# Patient Record
Sex: Male | Born: 1945 | Race: White | Hispanic: No | Marital: Married | Smoking: Former smoker
Health system: Southern US, Community
[De-identification: ages and names within clinical notes are randomized; demographics above are authoritative.]

## PROBLEM LIST (undated history)

## (undated) ENCOUNTER — Ambulatory Visit (HOSPITAL_BASED_OUTPATIENT_CLINIC_OR_DEPARTMENT_OTHER): Payer: Self-pay | Admitting: Gastroenterology

## (undated) DIAGNOSIS — E785 Hyperlipidemia, unspecified: Secondary | ICD-10-CM

## (undated) DIAGNOSIS — K219 Gastro-esophageal reflux disease without esophagitis: Secondary | ICD-10-CM

## (undated) DIAGNOSIS — E119 Type 2 diabetes mellitus without complications: Secondary | ICD-10-CM

## (undated) DIAGNOSIS — C61 Malignant neoplasm of prostate: Secondary | ICD-10-CM

## (undated) DIAGNOSIS — I10 Essential (primary) hypertension: Secondary | ICD-10-CM

## (undated) DIAGNOSIS — IMO0001 Reserved for inherently not codable concepts without codable children: Secondary | ICD-10-CM

## (undated) DIAGNOSIS — F32A Depression, unspecified: Secondary | ICD-10-CM

## (undated) DIAGNOSIS — H9190 Unspecified hearing loss, unspecified ear: Secondary | ICD-10-CM

## (undated) DIAGNOSIS — F329 Major depressive disorder, single episode, unspecified: Secondary | ICD-10-CM

## (undated) DIAGNOSIS — C189 Malignant neoplasm of colon, unspecified: Secondary | ICD-10-CM

## (undated) DIAGNOSIS — F319 Bipolar disorder, unspecified: Secondary | ICD-10-CM

## (undated) DIAGNOSIS — C20 Malignant neoplasm of rectum: Secondary | ICD-10-CM

## (undated) DIAGNOSIS — I712 Thoracic aortic aneurysm, without rupture: Secondary | ICD-10-CM

## (undated) DIAGNOSIS — F419 Anxiety disorder, unspecified: Secondary | ICD-10-CM

## (undated) DIAGNOSIS — K8050 Calculus of bile duct without cholangitis or cholecystitis without obstruction: Secondary | ICD-10-CM

## (undated) DIAGNOSIS — I7121 Aneurysm of the ascending aorta, without rupture: Secondary | ICD-10-CM

## (undated) DIAGNOSIS — M1990 Unspecified osteoarthritis, unspecified site: Secondary | ICD-10-CM

## (undated) DIAGNOSIS — F0150 Vascular dementia without behavioral disturbance: Secondary | ICD-10-CM

## (undated) DIAGNOSIS — G709 Myoneural disorder, unspecified: Secondary | ICD-10-CM

## (undated) HISTORY — DX: Malignant neoplasm of colon, unspecified: C18.9

## (undated) HISTORY — PX: COLONOSCOPY: SHX174

## (undated) HISTORY — DX: Hyperlipidemia, unspecified: E78.5

## (undated) HISTORY — DX: Depression, unspecified: F32.A

## (undated) HISTORY — DX: Unspecified osteoarthritis, unspecified site: M19.90

## (undated) HISTORY — DX: Anxiety disorder, unspecified: F41.9

## (undated) HISTORY — DX: Malignant neoplasm of rectum: C20

## (undated) HISTORY — DX: Major depressive disorder, single episode, unspecified: F32.9

## (undated) HISTORY — DX: Vascular dementia, unspecified severity, without behavioral disturbance, psychotic disturbance, mood disturbance, and anxiety: F01.50

## (undated) HISTORY — DX: Type 2 diabetes mellitus without complications: E11.9

## (undated) HISTORY — DX: Bipolar disorder, unspecified: F31.9

## (undated) HISTORY — PX: CHOLECYSTECTOMY: SHX55

## (undated) HISTORY — PX: APPENDECTOMY (OPEN): SHX54

## (undated) HISTORY — PX: HERNIA REPAIR: SHX51

---

## 1990-07-26 ENCOUNTER — Inpatient Hospital Stay
Admission: EM | Admit: 1990-07-26 | Disposition: A | Discharge status: Home / Self Care | Payer: Self-pay | Source: Ambulatory Visit

## 1990-08-04 ENCOUNTER — Ambulatory Visit: Admit: 1990-08-04 | Disposition: A | Discharge status: Home / Self Care | Payer: Self-pay | Source: Ambulatory Visit

## 1990-08-19 ENCOUNTER — Emergency Department: Admit: 1990-08-19 | Disposition: A | Discharge status: Home / Self Care | Payer: Self-pay | Source: Ambulatory Visit

## 1991-01-11 ENCOUNTER — Inpatient Hospital Stay: Admission: RE | Admit: 1991-01-11 | Disposition: A | Discharge status: Home / Self Care | Payer: Self-pay

## 1994-11-20 ENCOUNTER — Emergency Department: Admit: 1994-11-20 | Disposition: A | Discharge status: Home / Self Care | Payer: Self-pay | Source: Ambulatory Visit

## 1994-12-26 ENCOUNTER — Ambulatory Visit: Admit: 1994-12-26 | Disposition: A | Discharge status: Home / Self Care | Payer: Self-pay | Source: Ambulatory Visit

## 1995-01-27 ENCOUNTER — Ambulatory Visit: Admit: 1995-01-27 | Disposition: A | Discharge status: Home / Self Care | Payer: Self-pay | Source: Ambulatory Visit

## 1995-02-14 ENCOUNTER — Ambulatory Visit: Admit: 1995-02-14 | Disposition: A | Discharge status: Home / Self Care | Payer: Self-pay | Source: Ambulatory Visit

## 1995-02-27 ENCOUNTER — Ambulatory Visit: Admit: 1995-02-27 | Disposition: A | Discharge status: Home / Self Care | Payer: Self-pay | Source: Ambulatory Visit

## 1995-02-28 ENCOUNTER — Ambulatory Visit: Admit: 1995-02-28 | Disposition: A | Discharge status: Home / Self Care | Payer: Self-pay | Source: Ambulatory Visit

## 1995-03-03 ENCOUNTER — Ambulatory Visit: Admit: 1995-03-03 | Disposition: A | Discharge status: Home / Self Care | Payer: Self-pay | Source: Ambulatory Visit

## 1996-03-22 ENCOUNTER — Ambulatory Visit: Admit: 1996-03-22 | Disposition: A | Discharge status: Home / Self Care | Payer: Self-pay | Source: Ambulatory Visit

## 1998-02-17 ENCOUNTER — Ambulatory Visit
Admission: RE | Admit: 1998-02-17 | Disposition: A | Discharge status: Home / Self Care | Payer: Self-pay | Source: Ambulatory Visit

## 1998-02-20 ENCOUNTER — Ambulatory Visit
Admission: RE | Admit: 1998-02-20 | Disposition: A | Discharge status: Home / Self Care | Payer: Self-pay | Source: Ambulatory Visit

## 1998-02-27 ENCOUNTER — Ambulatory Visit
Admission: RE | Admit: 1998-02-27 | Disposition: A | Discharge status: Home / Self Care | Payer: Self-pay | Source: Ambulatory Visit

## 1998-04-04 ENCOUNTER — Ambulatory Visit: Admit: 1998-04-04 | Disposition: A | Discharge status: Home / Self Care | Payer: Self-pay | Source: Ambulatory Visit

## 1998-04-17 ENCOUNTER — Ambulatory Visit
Admission: RE | Admit: 1998-04-17 | Disposition: A | Discharge status: Home / Self Care | Payer: Self-pay | Source: Ambulatory Visit

## 1998-05-16 ENCOUNTER — Ambulatory Visit
Admission: RE | Admit: 1998-05-16 | Disposition: A | Discharge status: Home / Self Care | Payer: Self-pay | Source: Ambulatory Visit

## 1998-06-15 ENCOUNTER — Ambulatory Visit
Admission: RE | Admit: 1998-06-15 | Disposition: A | Discharge status: Home / Self Care | Payer: Self-pay | Source: Ambulatory Visit

## 1999-02-12 DIAGNOSIS — C189 Malignant neoplasm of colon, unspecified: Secondary | ICD-10-CM

## 1999-02-12 HISTORY — PX: COLECTOMY: SHX59

## 1999-02-12 HISTORY — PX: COLOSTOMY: SHX63

## 1999-02-12 HISTORY — DX: Malignant neoplasm of colon, unspecified: C18.9

## 1999-04-17 ENCOUNTER — Encounter (INDEPENDENT_AMBULATORY_CARE_PROVIDER_SITE_OTHER): Payer: Self-pay | Admitting: Specialist

## 1999-04-17 ENCOUNTER — Other Ambulatory Visit: Admission: RE | Admit: 1999-04-17 | Discharge: 1999-04-17 | Payer: Self-pay | Admitting: Gastroenterology

## 1999-04-19 ENCOUNTER — Ambulatory Visit: Admission: RE | Admit: 1999-04-19 | Discharge: 1999-04-19 | Payer: Self-pay | Admitting: Gastroenterology

## 1999-04-19 ENCOUNTER — Encounter: Payer: Self-pay | Admitting: Gastroenterology

## 1999-04-25 ENCOUNTER — Encounter: Admission: RE | Admit: 1999-04-25 | Discharge: 1999-07-24 | Payer: Self-pay | Admitting: Radiation Oncology

## 1999-05-03 ENCOUNTER — Encounter: Payer: Self-pay | Admitting: General Surgery

## 1999-05-08 ENCOUNTER — Inpatient Hospital Stay (HOSPITAL_COMMUNITY): Admission: RE | Admit: 1999-05-08 | Discharge: 1999-05-16 | Payer: Self-pay | Admitting: General Surgery

## 1999-08-05 ENCOUNTER — Emergency Department: Admit: 1999-08-05 | Disposition: A | Discharge status: Home / Self Care | Payer: Self-pay | Source: Ambulatory Visit

## 1999-11-27 ENCOUNTER — Encounter: Payer: Self-pay | Admitting: Oncology

## 1999-11-27 ENCOUNTER — Ambulatory Visit (HOSPITAL_COMMUNITY): Admission: RE | Admit: 1999-11-27 | Discharge: 1999-11-27 | Payer: Self-pay | Admitting: Oncology

## 2000-03-19 ENCOUNTER — Encounter: Payer: Self-pay | Admitting: Oncology

## 2000-03-19 ENCOUNTER — Ambulatory Visit (HOSPITAL_COMMUNITY): Admission: RE | Admit: 2000-03-19 | Discharge: 2000-03-19 | Payer: Self-pay | Admitting: Oncology

## 2000-03-27 ENCOUNTER — Ambulatory Visit (HOSPITAL_COMMUNITY): Admission: RE | Admit: 2000-03-27 | Discharge: 2000-03-27 | Payer: Self-pay | Admitting: Oncology

## 2000-03-27 ENCOUNTER — Encounter: Payer: Self-pay | Admitting: Oncology

## 2000-05-07 ENCOUNTER — Encounter (INDEPENDENT_AMBULATORY_CARE_PROVIDER_SITE_OTHER): Payer: Self-pay | Admitting: Specialist

## 2000-05-07 ENCOUNTER — Encounter: Payer: Self-pay | Admitting: Oncology

## 2000-05-07 ENCOUNTER — Ambulatory Visit (HOSPITAL_COMMUNITY): Admission: RE | Admit: 2000-05-07 | Discharge: 2000-05-07 | Payer: Self-pay | Admitting: Oncology

## 2000-10-03 ENCOUNTER — Encounter: Payer: Self-pay | Admitting: Oncology

## 2000-10-03 ENCOUNTER — Ambulatory Visit (HOSPITAL_COMMUNITY): Admission: RE | Admit: 2000-10-03 | Discharge: 2000-10-03 | Payer: Self-pay | Admitting: Oncology

## 2001-02-02 ENCOUNTER — Ambulatory Visit (HOSPITAL_COMMUNITY): Admission: RE | Admit: 2001-02-02 | Discharge: 2001-02-02 | Payer: Self-pay | Admitting: Oncology

## 2001-02-02 ENCOUNTER — Encounter: Payer: Self-pay | Admitting: Oncology

## 2001-02-11 HISTORY — PX: BACK SURGERY: SHX140

## 2001-06-02 ENCOUNTER — Encounter: Payer: Self-pay | Admitting: Oncology

## 2001-06-02 ENCOUNTER — Ambulatory Visit (HOSPITAL_COMMUNITY): Admission: RE | Admit: 2001-06-02 | Discharge: 2001-06-02 | Payer: Self-pay | Admitting: Oncology

## 2001-12-01 ENCOUNTER — Encounter: Payer: Self-pay | Admitting: Oncology

## 2001-12-01 ENCOUNTER — Ambulatory Visit (HOSPITAL_COMMUNITY): Admission: RE | Admit: 2001-12-01 | Discharge: 2001-12-01 | Payer: Self-pay | Admitting: Oncology

## 2002-06-01 ENCOUNTER — Ambulatory Visit (HOSPITAL_COMMUNITY): Admission: RE | Admit: 2002-06-01 | Discharge: 2002-06-01 | Payer: Self-pay | Admitting: Oncology

## 2002-06-01 ENCOUNTER — Encounter: Payer: Self-pay | Admitting: Oncology

## 2002-08-23 ENCOUNTER — Ambulatory Visit
Admission: RE | Admit: 2002-08-23 | Disposition: A | Discharge status: Home / Self Care | Payer: Self-pay | Source: Ambulatory Visit

## 2002-12-09 ENCOUNTER — Ambulatory Visit (HOSPITAL_COMMUNITY): Admission: RE | Admit: 2002-12-09 | Discharge: 2002-12-09 | Payer: Self-pay | Admitting: Oncology

## 2002-12-24 ENCOUNTER — Ambulatory Visit (HOSPITAL_COMMUNITY): Admission: RE | Admit: 2002-12-24 | Discharge: 2002-12-24 | Payer: Self-pay | Admitting: Oncology

## 2003-06-09 ENCOUNTER — Ambulatory Visit (HOSPITAL_COMMUNITY): Admission: RE | Admit: 2003-06-09 | Discharge: 2003-06-09 | Payer: Self-pay | Admitting: Oncology

## 2003-09-27 ENCOUNTER — Ambulatory Visit (HOSPITAL_COMMUNITY): Admission: RE | Admit: 2003-09-27 | Discharge: 2003-09-27 | Payer: Self-pay | Admitting: Unknown Physician Specialty

## 2003-11-17 ENCOUNTER — Ambulatory Visit (HOSPITAL_COMMUNITY): Admission: RE | Admit: 2003-11-17 | Discharge: 2003-11-18 | Payer: Self-pay | Admitting: Neurosurgery

## 2003-12-03 ENCOUNTER — Ambulatory Visit: Payer: Self-pay | Admitting: Oncology

## 2003-12-15 ENCOUNTER — Ambulatory Visit (HOSPITAL_COMMUNITY): Admission: RE | Admit: 2003-12-15 | Discharge: 2003-12-15 | Payer: Self-pay | Admitting: Oncology

## 2004-02-28 ENCOUNTER — Ambulatory Visit: Payer: Self-pay | Admitting: Family Medicine

## 2004-03-19 ENCOUNTER — Ambulatory Visit: Payer: Self-pay | Admitting: Family Medicine

## 2004-03-28 ENCOUNTER — Ambulatory Visit: Payer: Self-pay | Admitting: Gastroenterology

## 2004-04-09 ENCOUNTER — Ambulatory Visit: Payer: Self-pay | Admitting: Gastroenterology

## 2004-06-04 ENCOUNTER — Ambulatory Visit: Payer: Self-pay | Admitting: Family Medicine

## 2004-06-12 ENCOUNTER — Ambulatory Visit (HOSPITAL_COMMUNITY): Admission: RE | Admit: 2004-06-12 | Discharge: 2004-06-12 | Payer: Self-pay | Admitting: Oncology

## 2004-06-13 ENCOUNTER — Ambulatory Visit (HOSPITAL_COMMUNITY): Admission: RE | Admit: 2004-06-13 | Discharge: 2004-06-13 | Payer: Self-pay | Admitting: Oncology

## 2004-06-13 ENCOUNTER — Ambulatory Visit: Payer: Self-pay | Admitting: Oncology

## 2004-07-02 ENCOUNTER — Ambulatory Visit: Payer: Self-pay | Admitting: Family Medicine

## 2004-08-16 ENCOUNTER — Ambulatory Visit: Payer: Self-pay | Admitting: Family Medicine

## 2004-09-11 ENCOUNTER — Ambulatory Visit: Payer: Self-pay | Admitting: Family Medicine

## 2004-10-30 ENCOUNTER — Ambulatory Visit: Payer: Self-pay | Admitting: Family Medicine

## 2004-11-15 ENCOUNTER — Ambulatory Visit: Payer: Self-pay | Admitting: Family Medicine

## 2004-12-06 ENCOUNTER — Ambulatory Visit
Admission: RE | Admit: 2004-12-06 | Disposition: A | Discharge status: Home / Self Care | Payer: Self-pay | Source: Ambulatory Visit

## 2005-01-29 ENCOUNTER — Ambulatory Visit: Payer: Self-pay | Admitting: Family Medicine

## 2005-04-30 ENCOUNTER — Ambulatory Visit: Payer: Self-pay | Admitting: Family Medicine

## 2005-06-11 ENCOUNTER — Ambulatory Visit: Payer: Self-pay | Admitting: Family Medicine

## 2005-06-11 ENCOUNTER — Ambulatory Visit: Payer: Self-pay | Admitting: Oncology

## 2005-06-12 LAB — COMPREHENSIVE METABOLIC PANEL
Alkaline Phosphatase: 71 U/L (ref 39–117)
Glucose, Bld: 102 mg/dL — ABNORMAL HIGH (ref 70–99)
Sodium: 140 mEq/L (ref 135–145)
Total Bilirubin: 0.9 mg/dL (ref 0.3–1.2)
Total Protein: 6.5 g/dL (ref 6.0–8.3)

## 2005-06-12 LAB — CBC WITH DIFFERENTIAL/PLATELET
Eosinophils Absolute: 0.1 10*3/uL (ref 0.0–0.5)
LYMPH%: 23 % (ref 14.0–48.0)
MCHC: 34.9 g/dL (ref 32.0–35.9)
MCV: 85.8 fL (ref 81.6–98.0)
MONO%: 6.8 % (ref 0.0–13.0)
Platelets: 240 10*3/uL (ref 145–400)
RBC: 4.12 10*6/uL — ABNORMAL LOW (ref 4.20–5.71)

## 2005-06-12 LAB — LACTATE DEHYDROGENASE: LDH: 171 U/L (ref 94–250)

## 2005-06-17 ENCOUNTER — Ambulatory Visit (HOSPITAL_COMMUNITY): Admission: RE | Admit: 2005-06-17 | Discharge: 2005-06-17 | Payer: Self-pay | Admitting: Oncology

## 2005-07-31 ENCOUNTER — Ambulatory Visit: Payer: Self-pay | Admitting: Family Medicine

## 2005-12-02 ENCOUNTER — Ambulatory Visit: Payer: Self-pay | Admitting: Family Medicine

## 2006-01-06 ENCOUNTER — Ambulatory Visit: Payer: Self-pay | Admitting: Family Medicine

## 2006-02-11 HISTORY — PX: CARPAL TUNNEL RELEASE: SHX101

## 2006-04-08 ENCOUNTER — Ambulatory Visit: Payer: Self-pay | Admitting: Family Medicine

## 2006-05-20 ENCOUNTER — Ambulatory Visit: Payer: Self-pay | Admitting: Family Medicine

## 2006-06-04 ENCOUNTER — Ambulatory Visit: Payer: Self-pay | Admitting: Oncology

## 2006-06-09 LAB — COMPREHENSIVE METABOLIC PANEL
ALT: 21 U/L (ref 0–53)
Albumin: 4.1 g/dL (ref 3.5–5.2)
Alkaline Phosphatase: 82 U/L (ref 39–117)
CO2: 27 mEq/L (ref 19–32)
Potassium: 4.8 mEq/L (ref 3.5–5.3)
Sodium: 143 mEq/L (ref 135–145)
Total Bilirubin: 0.7 mg/dL (ref 0.3–1.2)
Total Protein: 6.1 g/dL (ref 6.0–8.3)

## 2006-06-09 LAB — CBC WITH DIFFERENTIAL/PLATELET
BASO%: 0.3 % (ref 0.0–2.0)
LYMPH%: 28.7 % (ref 14.0–48.0)
MCHC: 35 g/dL (ref 32.0–35.9)
MONO#: 0.4 10*3/uL (ref 0.1–0.9)
Platelets: 196 10*3/uL (ref 145–400)
RBC: 3.97 10*6/uL — ABNORMAL LOW (ref 4.20–5.71)
RDW: 13.3 % (ref 11.2–14.6)
WBC: 3.5 10*3/uL — ABNORMAL LOW (ref 4.0–10.0)
lymph#: 1 10*3/uL (ref 0.9–3.3)

## 2006-06-09 LAB — LACTATE DEHYDROGENASE: LDH: 178 U/L (ref 94–250)

## 2006-06-10 ENCOUNTER — Ambulatory Visit (HOSPITAL_COMMUNITY): Admission: RE | Admit: 2006-06-10 | Discharge: 2006-06-10 | Payer: Self-pay | Admitting: Oncology

## 2006-06-18 LAB — CBC & DIFF AND RETIC
Basophils Absolute: 0 10*3/uL (ref 0.0–0.1)
EOS%: 1.8 % (ref 0.0–7.0)
HCT: 33.5 % — ABNORMAL LOW (ref 38.7–49.9)
HGB: 11.7 g/dL — ABNORMAL LOW (ref 13.0–17.1)
MCH: 29.1 pg (ref 28.0–33.4)
MCV: 83.2 fL (ref 81.6–98.0)
MONO%: 7.5 % (ref 0.0–13.0)
NEUT%: 71.5 % (ref 40.0–75.0)

## 2006-06-18 LAB — IRON AND TIBC
Iron: 46 ug/dL (ref 42–165)
TIBC: 318 ug/dL (ref 215–435)
UIBC: 272 ug/dL

## 2006-06-18 LAB — FERRITIN: Ferritin: 37 ng/mL (ref 22–322)

## 2006-06-24 ENCOUNTER — Ambulatory Visit: Payer: Self-pay | Admitting: Family Medicine

## 2006-07-08 ENCOUNTER — Ambulatory Visit: Payer: Self-pay | Admitting: Family Medicine

## 2006-07-15 ENCOUNTER — Ambulatory Visit: Payer: Self-pay | Admitting: Surgery

## 2006-09-05 ENCOUNTER — Ambulatory Visit: Admit: 2006-09-05 | Disposition: A | Discharge status: Home / Self Care | Payer: Self-pay | Source: Ambulatory Visit

## 2006-10-04 ENCOUNTER — Ambulatory Visit (INDEPENDENT_AMBULATORY_CARE_PROVIDER_SITE_OTHER): Admit: 2006-10-04 | Disposition: A | Discharge status: Home / Self Care | Payer: Self-pay | Source: Ambulatory Visit

## 2006-10-06 ENCOUNTER — Ambulatory Visit
Admission: RE | Admit: 2006-10-06 | Disposition: A | Discharge status: Home / Self Care | Payer: Self-pay | Source: Ambulatory Visit

## 2006-10-07 ENCOUNTER — Emergency Department
Admission: EM | Admit: 2006-10-07 | Disposition: A | Discharge status: Home / Self Care | Payer: Self-pay | Source: Ambulatory Visit

## 2006-10-14 ENCOUNTER — Ambulatory Visit (HOSPITAL_BASED_OUTPATIENT_CLINIC_OR_DEPARTMENT_OTHER): Admission: RE | Admit: 2006-10-14 | Discharge: 2006-10-14 | Payer: Self-pay | Admitting: Orthopedic Surgery

## 2006-10-15 ENCOUNTER — Ambulatory Visit
Admission: RE | Admit: 2006-10-15 | Disposition: A | Discharge status: Home / Self Care | Payer: Self-pay | Source: Ambulatory Visit

## 2006-10-21 ENCOUNTER — Ambulatory Visit
Admission: RE | Admit: 2006-10-21 | Disposition: A | Discharge status: Home / Self Care | Payer: Self-pay | Source: Ambulatory Visit

## 2006-10-23 ENCOUNTER — Ambulatory Visit
Admission: RE | Admit: 2006-10-23 | Disposition: A | Discharge status: Home / Self Care | Payer: Self-pay | Source: Ambulatory Visit

## 2006-11-28 ENCOUNTER — Ambulatory Visit (HOSPITAL_BASED_OUTPATIENT_CLINIC_OR_DEPARTMENT_OTHER): Admission: RE | Admit: 2006-11-28 | Discharge: 2006-11-28 | Payer: Self-pay | Admitting: Orthopedic Surgery

## 2007-02-12 HISTORY — PX: FRACTURE SURGERY: SHX138

## 2007-04-17 ENCOUNTER — Ambulatory Visit: Payer: Self-pay | Admitting: Gastroenterology

## 2007-04-28 ENCOUNTER — Ambulatory Visit: Payer: Self-pay | Admitting: Gastroenterology

## 2007-05-16 ENCOUNTER — Inpatient Hospital Stay (HOSPITAL_COMMUNITY): Admission: EM | Admit: 2007-05-16 | Discharge: 2007-05-21 | Payer: Self-pay | Admitting: Emergency Medicine

## 2007-06-10 ENCOUNTER — Ambulatory Visit: Payer: Self-pay | Admitting: Oncology

## 2007-06-15 LAB — CBC & DIFF AND RETIC
BASO%: 0.4 % (ref 0.0–2.0)
EOS%: 2.4 % (ref 0.0–7.0)
HCT: 36.3 % — ABNORMAL LOW (ref 38.7–49.9)
LYMPH%: 23.3 % (ref 14.0–48.0)
MCH: 29.5 pg (ref 28.0–33.4)
MCHC: 35 g/dL (ref 32.0–35.9)
NEUT%: 64.4 % (ref 40.0–75.0)
Platelets: 194 10*3/uL (ref 145–400)
RBC: 4.31 10*6/uL (ref 4.20–5.71)
lymph#: 1 10*3/uL (ref 0.9–3.3)

## 2007-06-15 LAB — CHCC SMEAR

## 2007-06-15 LAB — COMPREHENSIVE METABOLIC PANEL
CO2: 26 mEq/L (ref 19–32)
Creatinine, Ser: 0.67 mg/dL (ref 0.40–1.50)
Glucose, Bld: 97 mg/dL (ref 70–99)
Total Bilirubin: 0.8 mg/dL (ref 0.3–1.2)

## 2007-06-15 LAB — CEA: CEA: 0.9 ng/mL (ref 0.0–5.0)

## 2007-06-15 LAB — MORPHOLOGY: PLT EST: ADEQUATE

## 2007-06-15 LAB — LACTATE DEHYDROGENASE: LDH: 176 U/L (ref 94–250)

## 2007-06-15 LAB — IRON AND TIBC
Iron: 88 ug/dL (ref 42–165)
TIBC: 322 ug/dL (ref 215–435)
UIBC: 234 ug/dL

## 2007-06-16 ENCOUNTER — Ambulatory Visit (HOSPITAL_COMMUNITY): Admission: RE | Admit: 2007-06-16 | Discharge: 2007-06-16 | Payer: Self-pay | Admitting: Oncology

## 2007-06-22 ENCOUNTER — Encounter: Payer: Self-pay | Admitting: Gastroenterology

## 2007-07-21 ENCOUNTER — Ambulatory Visit: Payer: Self-pay | Admitting: Surgery

## 2007-07-28 ENCOUNTER — Encounter: Admission: RE | Admit: 2007-07-28 | Discharge: 2007-09-15 | Payer: Self-pay | Admitting: Orthopaedic Surgery

## 2007-10-20 ENCOUNTER — Ambulatory Visit (HOSPITAL_COMMUNITY): Admission: RE | Admit: 2007-10-20 | Discharge: 2007-10-20 | Payer: Self-pay | Admitting: Family Medicine

## 2007-11-20 ENCOUNTER — Encounter: Admission: RE | Admit: 2007-11-20 | Discharge: 2007-11-20 | Payer: Self-pay | Admitting: Neurosurgery

## 2008-09-01 ENCOUNTER — Emergency Department (HOSPITAL_COMMUNITY): Admission: EM | Admit: 2008-09-01 | Discharge: 2008-09-01 | Payer: Self-pay | Admitting: Emergency Medicine

## 2009-07-18 ENCOUNTER — Encounter: Admission: RE | Admit: 2009-07-18 | Discharge: 2009-07-18 | Payer: Self-pay | Admitting: Surgery

## 2009-07-18 ENCOUNTER — Ambulatory Visit: Payer: Self-pay | Admitting: Surgery

## 2010-02-11 HISTORY — PX: TRANSURETHRAL RESECTION OF PROSTATE: SHX73

## 2010-03-03 ENCOUNTER — Encounter: Payer: Self-pay | Admitting: Oncology

## 2010-04-25 ENCOUNTER — Encounter: Payer: Self-pay | Admitting: Gastroenterology

## 2010-05-01 NOTE — Letter (Signed)
Summary: Colonoscopy Letter  [facility]  [address] [place], [place] [address]   Phone: [phone]  Fax: [phone]      April 25, 2010 MRN: [id number]   [facility] [address] [place], [place]  [address]   Dear Ricky Conley,   According to your medical record, it is time for you to schedule a Colonoscopy. The American Cancer Society recommends this procedure as a method to detect early colon cancer. Patients with a family history of colon cancer, or a personal history of colon polyps or inflammatory bowel disease are at increased risk.  This letter has been generated based on the recommendations made at the time of your procedure. If you feel that in your particular situation this may no longer apply, please contact our office.  Please call our office at [phone] to schedule this appointment or to update your records at your earliest convenience.  Thank you for cooperating with [place] to provide you with the very best care possible.   Sincerely,  Judie Petit T. Russella Dar, M.D.  [facility] Gastroenterology Division [phone]

## 2010-05-02 ENCOUNTER — Encounter: Payer: Self-pay | Admitting: Internal Medicine

## 2010-05-10 NOTE — Letter (Signed)
Summary: Pre Visit Letter Revised  [facility]  [address] [place], [place] [address]   Phone: [phone]  Fax: [phone]        05/02/2010 MRN: [id number] [facility] [address] [place], [place]  [address]             Procedure Date:  Jun 13, 2010   recall col Dr Russella Dar  Welcome to the Gastroenterology Division at [facility].    You are scheduled to see a nurse for your pre-procedure visit on May 30, 2010 at 9:00am on the 3rd floor at [facility], [address]  We ask that you try to arrive at our office 15 minutes prior to your appointment time to allow for check-in.  Please take a minute to review the attached form.  If you answer "Yes" to one or more of the questions on the first page, we ask that you call the person listed at your earliest opportunity.  If you answer "No" to all of the questions, please complete the rest of the form and bring it to your appointment.    Your nurse visit will consist of discussing your medical and surgical history, your immediate family medical history, and your medications.   If you are unable to list all of your medications on the form, please bring the medication bottles to your appointment and we will list them.  We will need to be aware of both prescribed and over the counter drugs.  We will need to know exact dosage information as well.    Please be prepared to read and sign documents such as consent forms, a financial agreement, and acknowledgement forms.  If necessary, and with your consent, a friend or relative is welcome to sit-in on the nurse visit with you.  Please bring your insurance card so that we may make a copy of it.  If your insurance requires a referral to see a specialist, please bring your referral form from your primary care physician.  No co-pay is required for this nurse visit.     If you cannot keep your appointment, please call [phone] to cancel or reschedule prior to  your appointment date.  This allows [place] the opportunity to schedule an appointment for another patient in need of care.    Thank you for choosing [facility] for your medical needs.  We appreciate the opportunity to care for you.  Please visit [place] at our website  to learn more about our practice.  Sincerely, The Gastroenterology Division

## 2010-05-30 ENCOUNTER — Ambulatory Visit (AMBULATORY_SURGERY_CENTER): Payer: Medicare Other | Admitting: *Deleted

## 2010-05-30 VITALS — Ht 70.0 in | Wt 189.1 lb

## 2010-05-30 DIAGNOSIS — Z85038 Personal history of other malignant neoplasm of large intestine: Secondary | ICD-10-CM

## 2010-05-30 MED ORDER — PEG-KCL-NACL-NASULF-NA ASC-C 100 G PO SOLR: ORAL | Status: DC

## 2010-06-13 ENCOUNTER — Ambulatory Visit (AMBULATORY_SURGERY_CENTER): Payer: Medicare Other | Admitting: Gastroenterology

## 2010-06-13 ENCOUNTER — Other Ambulatory Visit: Payer: Self-pay | Admitting: Surgery

## 2010-06-13 ENCOUNTER — Encounter: Payer: Self-pay | Admitting: Gastroenterology

## 2010-06-13 DIAGNOSIS — I712 Thoracic aortic aneurysm, without rupture: Secondary | ICD-10-CM

## 2010-06-13 DIAGNOSIS — Z1211 Encounter for screening for malignant neoplasm of colon: Secondary | ICD-10-CM

## 2010-06-13 DIAGNOSIS — Z85038 Personal history of other malignant neoplasm of large intestine: Secondary | ICD-10-CM

## 2010-06-13 DIAGNOSIS — C189 Malignant neoplasm of colon, unspecified: Secondary | ICD-10-CM

## 2010-06-13 MED ORDER — SODIUM CHLORIDE 0.9 % IV SOLN: 500.0000 mL | INTRAVENOUS | Status: DC

## 2010-06-13 NOTE — Patient Instructions (Signed)
Resume all medications. Repeat procedure in 3 years.

## 2010-06-14 ENCOUNTER — Telehealth: Payer: Self-pay

## 2010-06-14 NOTE — Telephone Encounter (Signed)
Follow up Call- Patient questions:  Do you have a fever, pain , or abdominal swelling? no Pain Score  0 *  Have you tolerated food without any problems? yes  Have you been able to return to your normal activities? yes  Do you have any questions about your discharge instructions: Diet   no Medications  no Follow up visit  no  Do you have questions or concerns about your Care? no  Actions: * If pain score is 4 or above: No action needed, pain <4.  "I'm eating a big breakfast now and I feel good.  I slept most of yesterday."  maw

## 2010-06-26 NOTE — Op Note (Signed)
NAMEJONMARC, BODKIN                  ACCOUNT NO.:  [id number]   MEDICAL RECORD NO.:  [id number]          PATIENT TYPE:  AMB   LOCATION:  DSC                          FACILITY:  [facility]   PHYSICIAN:  Katy Fitch. Sypher, M.D. DATE OF BIRTH:  1945/12/17   DATE OF PROCEDURE:  11/28/2006  DATE OF DISCHARGE:  11/28/2006                               OPERATIVE REPORT   PREOPERATIVE DIAGNOSIS:  Entrapment neuropathy, median nerve, left  carpal tunnel.   POSTOPERATIVE DIAGNOSIS:  Entrapment neuropathy, median nerve, left  carpal tunnel.   OPERATIONS:  Release of left transverse carpal ligament.   OPERATIONS:  Katy Fitch. Sypher, M.D.   ASSISTANT:  Molly Maduro Dasnoit P.A.-C.   ANESTHESIA:  General by LMA.   SUPERVISING ANESTHESIOLOGIST:  Gwin Eagon. Krista Blue, M.D.   INDICATIONS:  Ricky Churn.  Conley is a 65 year old gentleman referred for  evaluation and management of hand numbness and discomfort.  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  Electrodiagnostic studies confirmed significant median neuropathy.   Ricky Conley has had a prior right carpal tunnel release with a good result  on October 14, 2006.  He now presents for a similar surgery on the  left.   After informed consent, he is brought to the operating room at this  time.   PROCEDURE:  [name] is brought to operating room and placed in the  supine position on the operating table.   Following induction of general anesthesia by LMA technique, the left arm  was prepped with Betadine soap and solution and sterilely draped.  A  pneumatic tourniquet was applied to the proximal left brachium.   Following exsanguination of the left arm with an Esmarch bandage, the  arterial tourniquet was inflated to 220 mmHg.  The procedure commenced  with a short incision in the line of the ring finger in the palm.  The  subcutaneous tissues were carefully divided, revealing the palmar  fascia.  This was split longitudinally to  the common sensory branch of  the median nerve.  These were followed back to transverse carpal  ligament which was gently isolated from the median nerve.  The ligament  was then released along its ulnar border extending into the distal  forearm.   This widely opened the carpal canal.   No mass or other predicaments noted.   Bleeding points along the margin of the released ligament were  electrocauterized with bipolar current.  The wound was then repaired  with intradermal 3-0 Prolene suture.   A compressive dressing was applied with a volar plaster splint  maintaining the wrist in 5 degrees of dorsiflexion.   There were no apparent complications.   Ricky Conley tolerated surgery and anesthesia well.  He was transferred to  the recovery room with stable vital signs.      [name], M.D.  Electronically Signed     RVS/MEDQ  D:  11/28/2006  T:  11/30/2006  Job:  [id number]
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
## 2010-06-26 NOTE — Consult Note (Signed)
NEW PATIENT CONSULTATION   Ricky Conley, Ricky Conley  DOB:  09-22-1945                                        July 15, 2006  CHART #:  [id number]   REASON FOR CONSULTATION:  Ascending aortic aneurysm.   CLINICAL HISTORY:  I was asked to evaluate this gentleman with an  ascending aortic aneurysm by Dr. Joette Catching.  The patient is a 65  year-old gentleman with a history of colorectal cancer, diagnosed in  2001 treated with colon resection and colostomy.  He was subsequently  treated with chemotherapy and radiation therapy under the direction of  Dr. Pierce Crane.  He has been having periodic CT scans for follow up.  A  CT scan was performed on June 10, 2006, which showed an ascending aorta  that was enlarged at 4.0 cm.  The radiologist compared this to a  previous study of Jun 17, 2005; and it appears stable since that time.  There were coronary artery calcifications, indicating atherosclerosis in  the mid-to-distal LAD and mid-left circumflex territories.  There was  normal heart size without pericardial or pleural effusion.   The patient denies any chest pain or pressure.  He works in his yard  daily; and walks quite vigorously without symptoms.  He has had no  shortness of breath.  He has had no pain in his back, shoulders, or  arms.  His energy level has been normal.   PAST MEDICAL HISTORY:  Significant for hypercholesterolemia.  He also  has a history of colorectal cancer, as mentioned above.   FAMILY HISTORY:  Positive for cardiac disease.  His father died of  congestive heart failure in his 41s.  His mother also had a history of  heart disease.   SOCIAL HISTORY:  He is married and lives with his wife.  He has been  retired on disability.  He has four children.  He is a nonsmoker; and  denies alcohol abuse.   REVIEW OF SYSTEMS:  General:  He denies any fevers or chills.  He has  had no recent weight changes.  Cardiac: He denies any chest pain or  pressure.  He  denies shortness of breath.  He has had no PND or  orthopnea.  He denies palpitations.  He has had no peripheral edema.  Respiratory:  Denies cough or sputum production.  He has had no  hemoptysis.  Vascular:  He denies claudication phlebitis.  Neurologic:  He denies any focal weakness or numbness.  Denies dizziness and syncope.  He has never had a TIA or a stroke.   PHYSICAL EXAMINATION:  VITAL SIGNS:  His blood pressure is 117/69; pulse  is 70 and regular; and has respiratory rate is 18, unlabored.  GENERAL:  He is a well-developed, white male in no distress.  HEENT:  Shows him to be normocephalic, and atraumatic.  Pupils are equal  and reactive to light and accommodation.  Extraocular muscles are  intact.  His throat is clear.  NECK EXAM:  Shows normal carotid pulses bilaterally.  There are no  bruits.  There is no adenopathy or thyromegaly.  CARDIAC EXAM:  Shows a regular rate and rhythm with a normal S1 and S2.  There is no murmur, rub, or gallop.  LUNGS:  Clear.  Abdominal exam shows active bowel sounds.  His abdomen is soft  and  mildly obese.  There is no tenderness.  There are no masses or  organomegaly.  There is a colostomy in the left lower quadrant.  EXTREMITIES:  No peripheral edema.   IMPRESSION:  Mr. 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  On reviewing his old scans, back to December 09, 2002, shows  that there has been no change at all in the size of his ascending aorta  since that time.  We do not recommend surgical treatment of ascending  aortic aneurysm until it reaches about 5.5 cm; or showing rapid increase  in size.  Since this has been stable since October of 2004 I suspect  that it will probably remain stable.  I think the best treatment for him  would be good blood pressure control; and he appears to have that at  this time.  He has no history of hypertension.  He  said that he is  scheduled to have a follow up CT scan in one year for his colorectal  cancer; and this will most likely show the ascending aorta, so that we  can reevaluate the size at that time.  He will plan to call my office  after that scan is done so I can see him and discuss the size of his  ascending aorta with him at that time.   Evelene Croon, M.D.  Electronically Signed   BB/MEDQ  D:  07/15/2006  T:  07/15/2006  Job:  [id number]   cc:   Delaney Meigs, M.D.  Pierce Crane, M.D.

## 2010-06-26 NOTE — Assessment & Plan Note (Signed)
OFFICE VISIT   MACEDONIO, SCALLON  DOB:  Nov 16, 1945                                        July 21, 2007  CHART #:  [id number]   Mr. Baughman returns for followup of his ascending aortic aneurysm.  He is a  65 year old gentleman with a history of colorectal cancer, status post  colon resection and colostomy, who was noted to have a 4-cm ascending  aortic aneurysm on CT scan done on June 10, 2006.  This was compared to  a previous study of Jun 17, 2005, and had been stable since that time.  I  saw him last year and I recommended continued followup.  He was  scheduled for repeat CT of the chest and abdomen on Jun 16, 2007, for  followup of his colon cancer.  The radiologist did not comment on the  size of his aorta, but I have reviewed the images and it shows that the  ascending aorta is unchanged at about 4 cm in diameter.  There was no  evidence of recurrent or metastatic colon cancer.  Mr. Georgiou said that he  feels well overall.   PHYSICAL EXAMINATION:  VITAL SIGNS:  Blood pressure is 155/81 and his  pulse is 76 and regular.  Respiratory rate is 20 and unlabored.  GENERAL:  He looks well.  CARDIAC:  Regular rate and rhythm with normal S1 and S2.  There is no  murmur, rub, or gallop.  LUNGS:  Clear.   IMPRESSION:  Mr. Moss has a stable 4-cm ascending aortic aneurysm that  extends from the supracoronary aorta up to the takeoff of the innominate  artery.  This has been stable since at least 2007.  I recommended  continued good blood pressure control and we will repeat an MR  angiogram for followup in 2 years.  He will continue to follow up with  his primary physician, Dr. Holley Bouche, and I will see him back in  about 2 years.   Evelene Croon, M.D.  Electronically Signed   BB/MEDQ  D:  07/21/2007  T:  07/22/2007  Job:  [id number]   cc:   Holley Bouche, M.D.  Pierce Crane, MD

## 2010-06-26 NOTE — Op Note (Signed)
NAMEDARRILL, VREELAND                  ACCOUNT NO.:  [id number]   MEDICAL RECORD NO.:  [id number]          PATIENT TYPE:  AMB   LOCATION:  DSC                          FACILITY:  [facility]   PHYSICIAN:  Katy Fitch. Sypher, M.D. DATE OF BIRTH:  1945/07/22   DATE OF PROCEDURE:  10/14/2006  DATE OF DISCHARGE:                               OPERATIVE REPORT   PREP DIAGNOSIS:  Right carpal tunnel syndrome.   POSTOP DIAGNOSIS:  Right carpal tunnel syndrome.   OPERATION:  Release of right transverse carpal ligament.   OPERATIONS:  Katy Fitch. Sypher, M.D.   ASSISTANT:  Marveen Reeks. Dasnoit, P.A.-C.   ANESTHESIA:  General by LMA.   SUPERVISING ANESTHESIOLOGIST:  Idris Edmundson. Krista Blue, M.D.   INDICATIONS:  Genene Churn.  Albea is a 65 year old man referred through the  courtesy of Dr. Joette Catching of [place] for evaluation  and management of hand numbness.  Mr. Wahlen was evaluated by Laurier Nancy, M.D. an attending physiatry who noted signs of carpal tunnel  syndrome.  Electrodiagnostic studies confirmed bilateral carpal tunnel  syndrome, right greater than left.  After informed consent Mr. Koskela was  brought to the operating, at this time, for release of his right  transverse carpal ligament.   DESCRIPTION OF PROCEDURE:  Kentarius Partington.  Sigl was brought to operating room  and placed in the supine position on the operating table.  Following the  induction of general anesthesia by LMA technique the right arm was  prepped with Betadine soap and solution, and sterilely draped.  Following exsanguination of the right arm with Esmarch bandage, arterial  tourniquet was inflated to 260 mmHg.   Procedure commenced with a short incision in the line of the ring finger  and the palm.  Subcutaneous tissues were carefully divided in the palmar  fascia.  The fascia was split longitudinally to the common sensory  branch of the median nerve.  These were followed back to the transverse  carpal ligament  which was gently isolated to the median nerve.  The  ligament was then released along its ulnar border extending into the  distal forearm.   This widely opened carpal canal.  No masses or other predicaments were  noted.  The wound was inspected for bleeding points and subsequently  repaired with intradermal 3-0 Prolene suture.  A compressive dressing  was applied with a volar plaster splint maintaining the wrist in 5  degrees of dorsiflexion.   For aftercare, Mr. Brinegar is provided a prescription for Percocet 5 mg one  p.o. q.4-6 h. p.r.n. pain, 20 tablets without refill.  He return to our  office for followup in 1 week.      [name], M.D.  Electronically Signed     RVS/MEDQ  D:  10/14/2006  T:  10/14/2006  Job:  [id number]   cc:   Delaney Meigs, M.D.
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
## 2010-06-26 NOTE — Assessment & Plan Note (Signed)
OFFICE VISIT   Ricky Conley, Ricky Conley  DOB:  1945/09/22                                        July 18, 2009  CHART #:  [id number]   HISTORY:  The patient returns today for followup of his ascending aortic  aneurysm.  I last saw him on July 21, 2007.  At which time, CT scan of  the chest showed this to be 4 cm and stable.  Since I last saw him, he  said he has continued to remain active and feels well overall.  He has  had no chest pain or pressure.   PHYSICAL EXAMINATION:  Blood pressure 130/60, pulse 67 and regular, and  respiratory rate is 18, unlabored.  Oxygen saturation on room air is  97%.  He looks well.  His cardiac exam shows regular rate and rhythm  with normal S1 and S2.  There is no murmur, rub, or gallop.  His lungs  are clear.   DIAGNOSTIC TESTS:  MR angiogram of the chest today shows the maximum  diameter of the ascending aorta to be 4.4 cm.   IMPRESSION:  The patient's aneurysm appears slightly larger by MR  angiogram today than it did by CT scan 2 years ago.  Since these are 2  different studies, it is possible that it may be completely stable.  I  do not think there is any indication for intervention at this time.  I  will plan to see him back in 1 year with a repeat MR angiogram of the  chest.  I continued to stress the importance of good blood pressure  control.   Evelene Croon, M.D.  Electronically Signed   BB/MEDQ  D:  07/18/2009  T:  07/19/2009  Job:  [id number]

## 2010-06-29 NOTE — Op Note (Signed)
[place]. [facility]  Patient:    Ricky Conley, Ricky Conley                         MRN: [id number] Proc. Date: 05/08/99 Adm. Date:  [id number] Attending:  Brandy Hale CC:         Venita Lick. Pleas Koch., M.D. [facility]             Pierce Crane, M.D.             Billie Lade, M.D.             Phylis Bougie, M.D.                           Operative Report  PREOPERATIVE DIAGNOSES: 1. Carcinoma of the distal rectum. 2. 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  POSTOPERATIVE DIAGNOSES: 1. Carcinoma of the distal rectum. 2. 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  OPERATION PERFORMED:  Exploratory laparotomy, abdominal and perineal resection f the rectum and sigmoid colon, with in-colostomy; micro mesh pelvic floor reconstruction.  SURGEON:  Angelia Mould. Derrell Lolling, M.D.  FIRST ASSISTANT:  Zigmund Daniel, M.D.  OPERATIVE INDICATIONS:  This is a 65 year old white male who was evaluated recently because of a nine-month history of pelvic pressure sensation, and a two-month history of low volume rectal bleeding.  A rectal examination reveals an ulcerated mass on the interior rectal wall, about 2 cm inside the anal verge.  The mass is about 3 cm in diameter.  Colonoscopy reveals a 3 cm x 2 cm ulcerated mass on the anterior rectal wall.  Biopsy shows moderately differentiated, invasive colonic  adenocarcinoma.  On colonoscopy Dr. Russella Dar also found two other neoplastic polyps, one at 15 cm and one at 25 cm (which were pedunculated).  Both polyps showed moderately differentiated, invasive colorectal carcinoma invading the stalk. Dr. Russella Dar felt that he had three synchronous carcinomas, and felt that both proximal polyps represented invasive cancer and they would need to be resected. A CT scan was performed at [facility] on April 19, 1999 (was essentially  normal).   There is a little bit of rectal wall thickening, but really no mass and no signs of metastatic disease with adenopathy.  The lower lung fields looked fine.  The patient was seen preoperatively by Dr. Pierce Crane, Dr. Antony Blackbird and myself.  We felt that the best course of action was to proceed with abdominal and perineal resection of rectum, and at the same time to resect the sigmoid colon o remove the areas where the malignant polyps had been.  The patient is in agreement with this.  OPERATIVE FINDINGS:  The patient had no sign of intra-abdominal cancer.  The liver, gallbladder, stomach, spleen, duodenum, small intestine and large intestine looked and felt normal.  There was no palpable adenopathy.  Both ureters were identified and preserved; they were not dilated.  Peritoneal surfaces and omentum felt normal.  The bladder was slightly large and hypertrophied.  The prostate felt normal.  OPERATIVE TECHNIQUE:  Following the induction of general endotracheal anesthesia, a nasogastric tube was placed.  A Foley catheter was inserted without difficulty.  The patient was placed in the modified dorsolithotomy position.  A pursestring suture of 2-0 silk was placed circumferentially around the anal verge,  closing ff the rectal sphincter.  The abdomen, groins, genitalia and perineal rectal area ere prepped and draped in a sterile fashion.  Midline laparotomy was performed, extending the incision to the right of the umbilicus, with the intent of the colostomy to go on the left side (which had been previously marked).  The abdomen was entered and explored, with findings as described above.  Adhesions were taken down, mobilizing the cecum and terminal ileum.  These were packed away with self-retaining retractors.  We mobilized the descending colon and the sigmoid colon by dividing his lateral and peritoneal attachments.  We identified the left ureter, and it was preserved.  We had  good  mobility of the colon and so we transected the colon at the junction between the descending colon and the sigmoid colon; so we could essentially resect all of the sigmoid colon.  This division was performed with a GIA stapling device.  We scored the mesentery with electrocautery.  Larger mesenteric vessels were controlled with Kelly clamps, divided and ligated with 2-0 silk ties.  The superior hemorrhoidal artery and vein were large; that was controlled with clamps, suture-ligated with 2-0 silk suture ligature, and a second 2-0 silk tie.  The right ureter was also  identified and preserved.  Once we took the mesenteric dissection all the way down to the sacral promontory, we used the harmonic scalpel to do most of the dissection from there on out.  We mobilized the rectum out of the hollow of the sacrum and the avascular plane.  That went well.  Lateral pelvic vessels were divided with the  harmonic scalpel.  Anteriorly the rectum was dissected off of the bladder and ultimately off of the prostate gland.  After some dissection we had the rectum mobilized all the way down to below the tip of the coccyx; we could feel the small tumor on the anterior wall of the rectum.  There was no signs of adenopathy or invasion through the wall.  Once we had dissection all the way down to the pelvic floor and hemostasis was good, I went down to the perineum.  A sagittally oriented elliptical incision was made around the rectum.  Dissection was carried down through the subcutaneous tissue.  With traction on the anal skin we were able to dissect the rectum away from the levator ani muscles and pelvic floor circumferentially; removed the specimen completely.  I discussed the case with Dr. Esther Hardy and made her aware of the three synchronous lesions.  We marked the specimen and oriented her to the proximal and distal extent of all the tissues that had been removed.  Hemostasis was  excellent and achieved with electrocautery.  We placed a 19-French  Blake drain in the hollow of the sacrum, and brought this out through the left duodenal area; sutured the skin with a nylon suture and connected it to a suction bulb.  The pelvic floor muscles were then closed with interrupted sutures of 2-0 Vicryl.  The subcutaneous tissue and the perineum was closed with interrupted sutures of 2-0 Vicryl; the perineal skin was closed with skin staples.  We then irrigated out the pelvis and found that hemostasis was good.  We originally attempted to close the peritoneum of the pelvis primarily, but proximally this would not close.  We chose to use Vicryl mesh to perform a pelvic sling to reconstruct the pelvic floor.  We cut this in a large elliptical shape (approximately 12-15 cm in diameter), and sutured this to  the edges of the peritoneum circumferentially with running sutures of 2-0 Vicryl.  This provided a very secure closure to the pelvic floor, keeping the small bowel and cecum out f the deep pelvis.  We examined the upper abdomen, there was no bleeding.  The descending colon was  quite pink and healthy.  We then performed a colostomy.  We excised a 2 cm circular button of skin in the left mid abdomen, at the lateral aspect of the rectus sheath in a previously marked area.  We resected some of the subcutaneous fat.  The anterior rectus sheath was incised in a cruciate fashion.  The rectus muscle was retracted but not divided.  The peritoneum was divided with electrocautery.  We  dilated this tract so we could easily pass two fingers through it.  We brought he colon through this wound, where it came easily and without tension.  We then further irrigated the abdomen and pelvis.  We placed the small bowel and colon back in anatomic positions.  The midline fascia was closed with running suture of #1 PDS, and the skin closed with skin staples.  We then created a  colostomy by excising the previously placed staple line.  The  colon was pink and healthy, had good bleeding.  We matured the colostomy with eight interrupted sutures of 3-0 Vicryl; this made a nice mature colostomy. Colostomy bag was placed.  All the wounds were cleansed and sterile bandages placed.  The patient tolerated the procedure well.  An epidural was planned and then the  patient was to go to recovery room.  ESTIMATED BLOOD LOSS:  About 400-500 cc.  COMPLICATIONS:  None.  Sponge, needle and instrument counts were correct.  During the very early minutes of the case the patient developed some bradycardia and mild hypotension, which responded to epinephrine and atropine.  He had no further problems.  This was interpreted by Dr.  Carlyle Basques and Dr. Darlyn Read as a vagal reaction.  It was felt that this would be self-limited. DD:  05/08/99 TD:  05/09/99 Job: [id number]

## 2010-06-29 NOTE — Discharge Summary (Signed)
[place]. [facility]  Patient:    Ricky Conley, Ricky Conley                         MRN: [id number] Adm. Date:  [id number] Disc. Date: [id number] Attending:  Brandy Hale CC:         Angelia Mould. Derrell Lolling, M.D.             Venita Lick. Pleas Koch., M.D. [facility]             Dr. Pierce Crane             Billie Lade, M.D.             Dr. Ilean Skill, [facility]                           Discharge Summary  DISCHARGE DIAGNOSES: 1. Carcinoma of the rectum, stage T3, N1. 2. Invasive adenocarcinoma of sigmoid colon polyps, no evidence of residual    cancer following sigmoid colectomy.  PROCEDURES: 1. Abdominal and perineal resection of rectum with permanent colostomy. 2. Sigmoid colectomy. 3. Vicryl mesh pelvic sling.  HISTORY OF PRESENT ILLNESS:  This is a 65 year old white male who had an nine-month history of rectal pressure and a two-month history of low rectal volume bleeding.  He noted a mass just inside his rectum on finger exam.  He was sent to see Dr. Claudette Head because of his bloody stool.  Colonoscopy revealed an ulcerated mass on the anterior rectal wall, slightly to the left, measuring 2 x 3 cm.  This mass was no more than 3-4 cm from the anal verge. He was also found to have two other neoplastic polyps, one at 15 cm and one at 25 cm.  Both of these were pedunculated and completely removed but they both showed invasive moderately differentiated carcinoma.  Dr. Russella Dar felt that these polyps represented invasive cancer and felt that the sigmoid colon needed to be resected as well.  He had a CT scan that showed no evidence of metastatic disease.  He saw Dr. Pierce Crane and Dr. Antony Blackbird preoperatively.  He saw me and on examination, I found that he had a firm ulcerated mass on the left side of the rectal wall which was a little bit mobile and no more than about 3 cm inside the anal verge.  I felt that the tumor involved the sphincter muscles and advised  him to undergo abdominal and perineal resection of the rectum and sigmoid colectomy.  He agreed with this plan.  He underwent a two-day bowel prep at home and was admitted electively.  PHYSICAL EXAMINATION:  GENERAL:  Thin, healthy-appearing middle-aged gentleman in no distress.  NECK:  No adenopathy or mass.  LUNGS:  Clear to auscultation bilaterally.  HEART:  Regular rate and rhythm.  ABDOMEN:  Soft, nontender, no mass.  A little bit of diastasis recti in the upper abdomen.  No abdominal mass or hepatomegaly.  GU:  Normal penis, scrotum and testes.  No inguinal adenopathy.  RECTAL:  External exam normal.  A firm, ulcerated, slightly mobile mass on the left side of the rectal wall just inside the rectum.  HOSPITAL COURSE:  On the day of admission, the patient was taken to the operating room where he underwent an extended abdominal and perineal resection of the rectum.  We basically had to pull the splenic flexure down a little bit and  resect the sigmoid colon and the rectum with closure of the pelvic floor with a Vicryl mesh sling with permanent and descending colostomy.  Postoperatively, the patient did quite well.  He had low-grade fever for a few days which was felt to be most likely secondary to atelectasis.  This ultimately resolved.  He an ileus for several days but ultimately this resolved and he was begun on a diet.  By the time of discharge, he was eating regular foot and having bowel movements through his colostomy.  He had no wound problems with all his wounds healing nicely at the time of discharge without signs of infection.  Final pathology report showed that he had stage T3 cancer with invasion through the wall of the rectum and 3/6 lymph nodes were positive.  The sigmoid colon specimen showed no residual cancer and no mesenteric adenopathy.  The patient was followed by Dr. Pierce Crane and Dr. Antony Blackbird.  They saw him while he was in the hospital.  He was  advised to undergo adjuvant chemotherapy and adjuvant radiation therapy as an outpatient and he agreed with this plan.  The patient progressed until he was ready to be discharged on 05/16/99.  At that time, he was afebrile.  His spirits were good.  He had good colostomy function and had begun colostomy care and teaching.  His perineal wounds were also healing uneventfully.  FOLLOW-UP:  He was asked to return to see me in the office in one to two weeks and to return to see Dr. Pierce Crane in two weeks.  SPECIAL INSTRUCTIONS:  Home health nursing was requested. DD:  06/04/99 TD:  06/05/99 Job: [id number]

## 2010-06-29 NOTE — Op Note (Signed)
Ricky Conley, Ricky Conley NO.:  [id number]   MEDICAL RECORD NO.:  [id number]          PATIENT TYPE:  OIB   LOCATION:  3025                         FACILITY:  [facility]   PHYSICIAN:  Reinaldo Meeker, M.D. DATE OF BIRTH:  February 01, 1946   DATE OF PROCEDURE:  11/17/2003  DATE OF DISCHARGE:                                 OPERATIVE REPORT   PREOPERATIVE DIAGNOSIS:  Herniated disk, L5-S1, left.   POSTOPERATIVE DIAGNOSIS:  Herniated disk, L5-S1, left.   OPERATION/PROCEDURE:  1.  Left L5-S1 intralaminar laminotomy for excision of herniated disk with      operating microscope.  2.  Microdissection, L5-S1 disk and S1 nerve root.   SURGEON:  Reinaldo Meeker, M.D.   ASSISTANT:  Tia Alert, M.D.   DESCRIPTION OF PROCEDURE:  After being placed in the prone position, the  patient's back was prepped and draped in the usual sterile fashion.  __________  and x-rays taken prior to the incision to identify the  appropriate level.  Midline incision was made above the spinous processes of  L5-S1.  Using the Bovie cutting current, the incision was carried down to  the spinous processes.  Subperiosteal dissection was carried out along the  left side of the spinous processes and lamina and self-retaining retractor  was placed for exposure.  A second x-ray showed approach to the  appropriate  level.  Using the high-speed drill, the inferior one-third of the L5 lamina  and the medial one-third of the facet joint were removed.  The drill was  then used to remove the superior one-third of the S1 lamina.  Residual bone  and ligamentum flavum was removed in a piecemeal fashion.  Microscope was  draped and brought into the field and used the remainder  of the case.  Using microdissection technique, the lateral aspect of the thecal sac and S1  nerve were identified.  Inferior to the disk space, __________  and disk  material was identified.  This was removed.  Disk space was then incised  with  a #15 blade.  Using the pituitary rongeurs and curets, thorough disk  space cleanout was carried out.  At the same time great care was taken to  avoid injury to the neural elements.  This was successfully done.  At this  point inspection was carried out once more in the area of the inferior  fragment.  Any residual disk material or fibrosis down in that area was  removed.  At this point inspection was carried out in all directions for any  evidence of residual compression and none could be identified.  Large  amounts of irrigation were carried out, any bleeding controlled with bipolar  coagulation and Gelfoam.  The wound was then closed using interrupted Vicryl  in the muscle, fascia, subcutaneous, and subcuticular tissues and staples on  the skin.  Sterile dressing was then applied.  The patient was extubated and  taken to the recovery room in stable condition.       ROK/MEDQ  D:  11/17/2003  T:  11/17/2003  Job:  [id number] 

## 2010-07-24 ENCOUNTER — Ambulatory Visit
Admission: RE | Admit: 2010-07-24 | Discharge: 2010-07-24 | Disposition: A | Discharge status: Home / Self Care | Payer: Medicare Other | Source: Ambulatory Visit | Attending: Surgery | Admitting: Surgery

## 2010-07-24 ENCOUNTER — Encounter (INDEPENDENT_AMBULATORY_CARE_PROVIDER_SITE_OTHER): Payer: Medicare Other | Admitting: Surgery

## 2010-07-24 DIAGNOSIS — I712 Thoracic aortic aneurysm, without rupture: Secondary | ICD-10-CM

## 2010-07-24 MED ORDER — GADOBENATE DIMEGLUMINE 529 MG/ML IV SOLN
15.0000 mL | Freq: Once | INTRAVENOUS | Status: AC | PRN
  Administered 2010-07-24: 15 mL via INTRAVENOUS

## 2010-07-25 NOTE — Assessment & Plan Note (Signed)
OFFICE VISIT  RAZA, BAYLESS DOB:  Feb 07, 1946                                        July 24, 2010 CHART #:  [id number]  HISTORY:  The patient returns to the office today for followup of an ascending aortic aneurysm.  I last saw him on July 18, 2009 at which time MR angiogram showed the maximal diameter to be 84.4 cm.  Over the past year he has had no specific complaints of chest or back pain.  He said that he was placed on Seroquel for possible diagnosis of bipolar disorder as much calmer since.  PHYSICAL EXAMINATION:  VITAL SIGNS:  Blood pressure 115/66, pulse 77, respiratory rate is 20 and unlabored.  Oxygen saturation on room air is 95%. GENERAL:  He looks well. CARDIAC:  Regular rate and rhythm with normal S1 and S2.  There is no murmur, rub, or gallop. LUNGS:  Clear.  MR angiogram chest today shows maximum diameter of his ascending aorta to be about 4.1 cm.  There has been no significant change over the past 2 years.  IMPRESSION:  Mr. Stroupe has a stable fusiform ascending aortic aneurysm with a maximum diameter of 4.1 cm.  This has been stable over the past 3 examinations.  Therefore, I think we can wait 2 years to repeat his MR angiogram.  He has good blood pressure control.  He will continue to follow up with his primary physician and I will see him back in 2 years.  Evelene Croon, M.D. Electronically Signed  BB/MEDQ  D:  07/24/2010  T:  07/25/2010  Job:  [id number]  cc:   Holley Bouche, M.D.

## 2010-11-06 LAB — DIFFERENTIAL
Basophils Absolute: 0
Basophils Relative: 1
Eosinophils Absolute: 0.1
Monocytes Relative: 8
Neutro Abs: 4.2
Neutrophils Relative %: 70

## 2010-11-06 LAB — BASIC METABOLIC PANEL
BUN: 18
CO2: 27
Calcium: 9.2
Chloride: 103
Creatinine, Ser: 0.67
Glucose, Bld: 85

## 2010-11-06 LAB — CBC
MCHC: 34
MCV: 86.6
Platelets: 197
RDW: 13.4

## 2010-11-21 LAB — POCT HEMOGLOBIN-HEMACUE
Hemoglobin: 12.3 — ABNORMAL LOW
Operator id: 112821

## 2011-09-06 ENCOUNTER — Other Ambulatory Visit: Payer: Self-pay | Admitting: Family Medicine

## 2011-09-06 DIAGNOSIS — M542 Cervicalgia: Secondary | ICD-10-CM

## 2011-09-14 ENCOUNTER — Ambulatory Visit
Admission: RE | Admit: 2011-09-14 | Discharge: 2011-09-14 | Disposition: A | Discharge status: Home / Self Care | Payer: Medicare Other | Source: Ambulatory Visit | Attending: Family Medicine | Admitting: Family Medicine

## 2011-09-14 DIAGNOSIS — M542 Cervicalgia: Secondary | ICD-10-CM

## 2011-10-08 ENCOUNTER — Other Ambulatory Visit: Payer: Self-pay | Admitting: Neurosurgery

## 2011-10-08 DIAGNOSIS — M542 Cervicalgia: Secondary | ICD-10-CM

## 2011-10-11 ENCOUNTER — Ambulatory Visit
Admission: RE | Admit: 2011-10-11 | Discharge: 2011-10-11 | Disposition: A | Discharge status: Home / Self Care | Payer: Medicare Other | Source: Ambulatory Visit | Attending: Neurosurgery | Admitting: Neurosurgery

## 2011-10-11 ENCOUNTER — Other Ambulatory Visit: Payer: Self-pay | Admitting: Neurosurgery

## 2011-10-11 VITALS — BP 147/74 | HR 58

## 2011-10-11 DIAGNOSIS — M542 Cervicalgia: Secondary | ICD-10-CM

## 2011-10-11 MED ORDER — MEPERIDINE HCL 100 MG/ML IJ SOLN
75.0000 mg | Freq: Once | INTRAMUSCULAR | Status: AC
  Administered 2011-10-11: 75 mg via INTRAMUSCULAR

## 2011-10-11 MED ORDER — ONDANSETRON HCL 4 MG/2ML IJ SOLN
4.0000 mg | Freq: Once | INTRAMUSCULAR | Status: AC
  Administered 2011-10-11: 4 mg via INTRAMUSCULAR

## 2011-10-11 MED ORDER — IOHEXOL 300 MG/ML  SOLN
10.0000 mL | Freq: Once | INTRAMUSCULAR | Status: AC | PRN
  Administered 2011-10-11: 10 mL via INTRATHECAL

## 2011-10-11 MED ORDER — DIAZEPAM 5 MG PO TABS
5.0000 mg | ORAL_TABLET | Freq: Once | ORAL | Status: AC
  Administered 2011-10-11: 5 mg via ORAL

## 2011-10-11 NOTE — Progress Notes (Signed)
Wife assisted patient to bathroom, then to nursing station to get dressed. jkl

## 2011-10-11 NOTE — Progress Notes (Signed)
Patient states he has been off Citalopram and Bupropion the past two days, "and I feel it!"  Discharge instructions explained to patient.  jkl

## 2011-10-11 NOTE — Progress Notes (Signed)
Patient resting quietly in nursing station after myelogram.  Denies pain.  Tolerating POs well.  Wife at bedside.  Discharge instructions reviewed with wife.  jkl

## 2012-07-03 ENCOUNTER — Other Ambulatory Visit: Payer: Self-pay | Admitting: *Deleted

## 2012-07-03 DIAGNOSIS — I712 Thoracic aortic aneurysm, without rupture: Secondary | ICD-10-CM

## 2012-08-10 ENCOUNTER — Ambulatory Visit: Payer: Medicare Other | Admitting: Thoracic Surgery (Cardiothoracic Vascular Surgery)

## 2012-08-11 ENCOUNTER — Other Ambulatory Visit: Payer: Medicare Other

## 2012-08-12 ENCOUNTER — Ambulatory Visit
Admission: RE | Admit: 2012-08-12 | Discharge: 2012-08-12 | Disposition: A | Discharge status: Home / Self Care | Payer: Medicare Other | Source: Ambulatory Visit | Attending: Surgery | Admitting: Surgery

## 2012-08-12 ENCOUNTER — Ambulatory Visit (INDEPENDENT_AMBULATORY_CARE_PROVIDER_SITE_OTHER): Payer: Medicare Other | Admitting: Surgery

## 2012-08-12 ENCOUNTER — Encounter: Payer: Self-pay | Admitting: Surgery

## 2012-08-12 VITALS — BP 147/86 | HR 72 | Resp 18 | Ht 70.0 in | Wt 185.0 lb

## 2012-08-12 DIAGNOSIS — I712 Thoracic aortic aneurysm, without rupture: Secondary | ICD-10-CM | POA: Insufficient documentation

## 2012-08-12 MED ORDER — GADOBENATE DIMEGLUMINE 529 MG/ML IV SOLN
17.0000 mL | Freq: Once | INTRAVENOUS | Status: AC | PRN
  Administered 2012-08-12: 17 mL via INTRAVENOUS

## 2012-08-12 NOTE — Progress Notes (Signed)
[address]       [place] Con[place] [address]             [id number]        HPI:  The patient returns today for followup of an ascending aortic aneurysm. This has been stable at 4.0 cm by CT scan going back to October of 2004. I last saw him on 07/18/2009 at which time an MR angiogram showed it to be 4.4 cm. We decided to continue following this since a CT scan 2 years prior to that showed that it was 4 cm. He was supposed to return in 2012 for a repeat MRA but has not returned until now. He denies any chest or back pain. He denies any shortness of breath. He denies peripheral edema. He said that his main problems have been with management of his bipolar disorder.  Current Outpatient Prescriptions  Medication Sig Dispense Refill  . aspirin 81 MG EC tablet Take 81 mg by mouth daily.        Marland Kitchen buPROPion (WELLBUTRIN SR) 150 MG 12 hr tablet Take 150 mg by mouth 2 (two) times daily.        . clonazePAM (KLONOPIN) 2 MG tablet Take 2 mg by mouth 4 (four) times daily -  before meals and at bedtime.        . divalproex (DEPAKOTE ER) 500 MG 24 hr tablet Take 500 mg by mouth daily. 2 tabs per day, 1000 mg total per day      . gabapentin (NEURONTIN) 300 MG capsule Take 300 mg by mouth 3 (three) times daily.        . meloxicam (MOBIC) 7.5 MG tablet Take 7.5 mg by mouth 2 (two) times daily after a meal.        . Multiple Vitamins-Minerals (MULTIVITAMIN WITH MINERALS) tablet Take 1 tablet by mouth daily.        . pravastatin (PRAVACHOL) 40 MG tablet Take 40 mg by mouth at bedtime. Takes two        Current Facility-Administered Medications  Medication Dose Route Frequency Provider Last Rate Last Dose  . 0.9 %  sodium chloride infusion  500 mL Intravenous Continuous Meryl Dare, MD         Physical Exam:  BP 147/86  Pulse 72  Resp 18  Ht 5\' 10"  (1.778 m)  Wt 185 lb (83.915 kg)  BMI 26.54 kg/m2  SpO2 98% He looks well. Cardiac exam shows a regular rate and rhythm with normal  S1 and S2. There is no murmur, rub, or gallop. Lung exam is clear. There is no peripheral edema.  Diagnostic Tests:  MR angiogram of the chest today has not been officially read but it has been reviewed by me. The maximum diameter of the ascending aortic aneurysm at the level of the right pulmonary artery is 4.0 cm. This aneurysm extends from the region of the sinotubular junction up to the proximal aortic arch and is unchanged from his prior MR angiogram in 2011.  Impression:  He has a stable 4.0 cm fusiform ascending aortic aneurysm that has not changed significantly since 2004. I don't think there is any indication for surgical intervention at this time. I stressed the importance of continued blood pressure control. He is not on a beta blocker but that is recommended to decrease the sheer force on his aorta. I will leave that decision up to his primary physician since he is on multiple other medications.  I recommended that he return in 2 years with a MR angiogram of the chest to followup on this aneurysm.  Plan:  He will return to see me in 2 years with an MR angiogram of the chest.

## 2013-02-18 ENCOUNTER — Inpatient Hospital Stay (HOSPITAL_COMMUNITY)
Admission: EM | Admit: 2013-02-18 | Discharge: 2013-02-19 | DRG: 641 | Disposition: A | Discharge status: Home / Self Care | Payer: Medicare Other | Attending: Internal Medicine | Admitting: Internal Medicine

## 2013-02-18 ENCOUNTER — Emergency Department (HOSPITAL_COMMUNITY): Payer: Medicare Other

## 2013-02-18 ENCOUNTER — Encounter (HOSPITAL_COMMUNITY): Payer: Self-pay | Admitting: Emergency Medicine

## 2013-02-18 DIAGNOSIS — E785 Hyperlipidemia, unspecified: Secondary | ICD-10-CM | POA: Diagnosis present

## 2013-02-18 DIAGNOSIS — Z87891 Personal history of nicotine dependence: Secondary | ICD-10-CM

## 2013-02-18 DIAGNOSIS — I712 Thoracic aortic aneurysm, without rupture: Secondary | ICD-10-CM

## 2013-02-18 DIAGNOSIS — Z85038 Personal history of other malignant neoplasm of large intestine: Secondary | ICD-10-CM

## 2013-02-18 DIAGNOSIS — Z79899 Other long term (current) drug therapy: Secondary | ICD-10-CM

## 2013-02-18 DIAGNOSIS — F329 Major depressive disorder, single episode, unspecified: Secondary | ICD-10-CM | POA: Diagnosis present

## 2013-02-18 DIAGNOSIS — R531 Weakness: Secondary | ICD-10-CM

## 2013-02-18 DIAGNOSIS — F3289 Other specified depressive episodes: Secondary | ICD-10-CM | POA: Diagnosis present

## 2013-02-18 DIAGNOSIS — E870 Hyperosmolality and hypernatremia: Principal | ICD-10-CM | POA: Diagnosis present

## 2013-02-18 DIAGNOSIS — Z933 Colostomy status: Secondary | ICD-10-CM

## 2013-02-18 DIAGNOSIS — M129 Arthropathy, unspecified: Secondary | ICD-10-CM | POA: Diagnosis present

## 2013-02-18 DIAGNOSIS — R4182 Altered mental status, unspecified: Secondary | ICD-10-CM

## 2013-02-18 DIAGNOSIS — Z7982 Long term (current) use of aspirin: Secondary | ICD-10-CM

## 2013-02-18 DIAGNOSIS — I7121 Aneurysm of the ascending aorta, without rupture: Secondary | ICD-10-CM

## 2013-02-18 DIAGNOSIS — F411 Generalized anxiety disorder: Secondary | ICD-10-CM | POA: Diagnosis present

## 2013-02-18 DIAGNOSIS — F319 Bipolar disorder, unspecified: Secondary | ICD-10-CM

## 2013-02-18 HISTORY — DX: Thoracic aortic aneurysm, without rupture: I71.2

## 2013-02-18 HISTORY — DX: Aneurysm of the ascending aorta, without rupture: I71.21

## 2013-02-18 LAB — CBC WITH DIFFERENTIAL/PLATELET
Basophils Absolute: 0 10*3/uL (ref 0.0–0.1)
Basophils Relative: 0 % (ref 0–1)
EOS ABS: 0.1 10*3/uL (ref 0.0–0.7)
EOS PCT: 1 % (ref 0–5)
HEMATOCRIT: 40.2 % (ref 39.0–52.0)
HEMOGLOBIN: 13.5 g/dL (ref 13.0–17.0)
LYMPHS ABS: 1.6 10*3/uL (ref 0.7–4.0)
LYMPHS PCT: 20 % (ref 12–46)
MCH: 30.4 pg (ref 26.0–34.0)
MCHC: 33.6 g/dL (ref 30.0–36.0)
MCV: 90.5 fL (ref 78.0–100.0)
MONO ABS: 1.4 10*3/uL — AB (ref 0.1–1.0)
MONOS PCT: 18 % — AB (ref 3–12)
Neutro Abs: 4.9 10*3/uL (ref 1.7–7.7)
Neutrophils Relative %: 61 % (ref 43–77)
Platelets: 170 10*3/uL (ref 150–400)
RBC: 4.44 MIL/uL (ref 4.22–5.81)
RDW: 13.3 % (ref 11.5–15.5)
WBC: 8 10*3/uL (ref 4.0–10.5)

## 2013-02-18 LAB — BASIC METABOLIC PANEL
BUN: 18 mg/dL (ref 6–23)
CO2: 30 mEq/L (ref 19–32)
CREATININE: 0.98 mg/dL (ref 0.50–1.35)
Calcium: 8.7 mg/dL (ref 8.4–10.5)
Chloride: 101 mEq/L (ref 96–112)
GFR, EST NON AFRICAN AMERICAN: 82 mL/min — AB (ref >90)
Glucose, Bld: 180 mg/dL — ABNORMAL HIGH (ref 70–99)
Potassium: 4.1 mEq/L (ref 3.7–5.3)
SODIUM: 142 meq/L (ref 137–147)

## 2013-02-18 LAB — COMPREHENSIVE METABOLIC PANEL
ALT: 33 U/L (ref 0–53)
AST: 49 U/L — ABNORMAL HIGH (ref 0–37)
Albumin: 2.8 g/dL — ABNORMAL LOW (ref 3.5–5.2)
Alkaline Phosphatase: 52 U/L (ref 39–117)
BUN: 18 mg/dL (ref 6–23)
CALCIUM: 6.9 mg/dL — AB (ref 8.4–10.5)
CO2: 18 meq/L — AB (ref 19–32)
Chloride: 75 mEq/L — ABNORMAL LOW (ref 96–112)
Creatinine, Ser: 0.81 mg/dL (ref 0.50–1.35)
GFR, EST NON AFRICAN AMERICAN: 89 mL/min — AB (ref >90)
GLUCOSE: 87 mg/dL (ref 70–99)
Potassium: 5.2 mEq/L (ref 3.7–5.3)
SODIUM: 159 meq/L — AB (ref 137–147)
TOTAL PROTEIN: 6.1 g/dL (ref 6.0–8.3)
Total Bilirubin: 0.6 mg/dL (ref 0.3–1.2)

## 2013-02-18 LAB — URINALYSIS, ROUTINE W REFLEX MICROSCOPIC
Glucose, UA: NEGATIVE mg/dL
Hgb urine dipstick: NEGATIVE
Ketones, ur: 15 mg/dL — AB
Leukocytes, UA: NEGATIVE
NITRITE: NEGATIVE
PH: 6 (ref 5.0–8.0)
Protein, ur: NEGATIVE mg/dL
SPECIFIC GRAVITY, URINE: 1.028 (ref 1.005–1.030)
Urobilinogen, UA: 2 mg/dL — ABNORMAL HIGH (ref 0.0–1.0)

## 2013-02-18 LAB — OSMOLALITY: Osmolality: 282 mOsm/kg (ref 275–300)

## 2013-02-18 LAB — POCT I-STAT TROPONIN I: Troponin i, poc: 0.01 ng/mL (ref 0.00–0.08)

## 2013-02-18 LAB — TROPONIN I

## 2013-02-18 LAB — GLUCOSE, CAPILLARY: Glucose-Capillary: 87 mg/dL (ref 70–99)

## 2013-02-18 LAB — SODIUM, URINE, RANDOM: Sodium, Ur: 51 mEq/L

## 2013-02-18 LAB — APTT: APTT: 29 s (ref 24–37)

## 2013-02-18 LAB — PROTIME-INR
INR: 1.06 (ref 0.00–1.49)
PROTHROMBIN TIME: 13.6 s (ref 11.6–15.2)

## 2013-02-18 LAB — OSMOLALITY, URINE: OSMOLALITY UR: 803 mosm/kg (ref 390–1090)

## 2013-02-18 MED ORDER — BUPROPION HCL ER (XL) 300 MG PO TB24
300.0000 mg | ORAL_TABLET | ORAL | Status: DC
  Administered 2013-02-19: 300 mg via ORAL
  Filled 2013-02-18 (×2): qty 1

## 2013-02-18 MED ORDER — ASPIRIN EC 81 MG PO TBEC
81.0000 mg | DELAYED_RELEASE_TABLET | ORAL | Status: DC
  Administered 2013-02-19: 06:00:00 81 mg via ORAL
  Filled 2013-02-18 (×2): qty 1

## 2013-02-18 MED ORDER — SIMVASTATIN 5 MG PO TABS
5.0000 mg | ORAL_TABLET | Freq: Every day | ORAL | Status: DC
  Administered 2013-02-18: 5 mg via ORAL
  Filled 2013-02-18 (×2): qty 1

## 2013-02-18 MED ORDER — ACETAMINOPHEN 325 MG PO TABS: 650.0000 mg | ORAL_TABLET | Freq: Four times a day (QID) | ORAL | Status: DC | PRN

## 2013-02-18 MED ORDER — ONDANSETRON HCL 4 MG PO TABS
4.0000 mg | ORAL_TABLET | Freq: Four times a day (QID) | ORAL | Status: DC | PRN
Start: 2013-02-18 — End: 2013-02-19

## 2013-02-18 MED ORDER — CLONAZEPAM 1 MG PO TABS: 2.0000 mg | ORAL_TABLET | Freq: Three times a day (TID) | ORAL | Status: DC | PRN

## 2013-02-18 MED ORDER — DEXTROSE 5 % IV SOLN
INTRAVENOUS | Status: DC
  Administered 2013-02-18: 20:00:00 via INTRAVENOUS

## 2013-02-18 MED ORDER — HYDRALAZINE HCL 20 MG/ML IJ SOLN
10.0000 mg | Freq: Four times a day (QID) | INTRAMUSCULAR | Status: DC | PRN
  Administered 2013-02-18 – 2013-02-19 (×2): 10 mg via INTRAVENOUS
  Filled 2013-02-18 (×2): qty 1

## 2013-02-18 MED ORDER — ONDANSETRON HCL 4 MG/2ML IJ SOLN: 4.0000 mg | Freq: Four times a day (QID) | INTRAMUSCULAR | Status: DC | PRN

## 2013-02-18 MED ORDER — TETRAHYDROZOLINE HCL 0.05 % OP SOLN
2.0000 [drp] | Freq: Three times a day (TID) | OPHTHALMIC | Status: DC
Start: 2013-02-18 — End: 2013-02-19
  Administered 2013-02-18 – 2013-02-19 (×2): 2 [drp] via OPHTHALMIC
  Filled 2013-02-18: qty 15

## 2013-02-18 MED ORDER — HEPARIN SODIUM (PORCINE) 5000 UNIT/ML IJ SOLN
5000.0000 [IU] | Freq: Three times a day (TID) | INTRAMUSCULAR | Status: DC
  Administered 2013-02-18 – 2013-02-19 (×2): 5000 [IU] via SUBCUTANEOUS
  Filled 2013-02-18 (×5): qty 1

## 2013-02-18 MED ORDER — ACETAMINOPHEN 650 MG RE SUPP: 650.0000 mg | Freq: Four times a day (QID) | RECTAL | Status: DC | PRN

## 2013-02-18 MED ORDER — DIVALPROEX SODIUM ER 500 MG PO TB24
1500.0000 mg | ORAL_TABLET | ORAL | Status: DC
  Administered 2013-02-19: 1500 mg via ORAL
  Filled 2013-02-18 (×2): qty 3

## 2013-02-18 NOTE — ED Notes (Signed)
hospitalist at bedside

## 2013-02-18 NOTE — ED Notes (Signed)
Patient has been uncoordinated and "drunk" per wife and himself since Monday.  Pt's wife reported him having a cold on Monday, his wife gave him mucinex, since that time patient has been very unsteady and uncoordinated, which is completely atypical for him.    Patient has equal grips bilaterally and no facial droop, but seems confused at times.  Patients left leg is noticeably weaker than the right.

## 2013-02-18 NOTE — ED Notes (Addendum)
Pt sent here by pcp for possible stroke.  Wife states pt was dragging L foot last night and leaning to R.  She also had to pick him up off of the floor last night after falling twice.  Decreased grip/resistance strength to L hand.  L pupil greater than R, but this is norm per wife.  Pt alert to self only.

## 2013-02-18 NOTE — H&P (Signed)
[facility] History and Physical  Ricky Conley JQZ:[id number] DOB: 05-17-1945 DOA: 02/18/2013  Referring physician: EDP PCP: Shirline Frees, MD   Chief Complaint: confusion and falls  HPI: Ricky Conley is a 68 y.o. 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 He went to his PCP office today for these symptoms and was found to have a serum sodium level of 159 and was advised to come to the hospital.  The history is provided mainly by his wife.  She states that he has been feeling weak, has not been eating much and may have had increased output from his ostomy.  States that he has had at least one episode of emesis. He has not had any measured fevers, but has had chills.  She has noticed for the past few days he has had a stooped over shuffling gate and poor balance.  Review of Systems:  Constitutional: No weight loss, night sweats, Fevers, has had chills and fatigue.  HEENT: No headaches, Difficulty swallowing,Tooth/dental problems,Sore throat, No sneezing, itching, ear ache, nasal congestion, post nasal drip,  Cardio-vascular: No chest pain, Orthopnea, PND, swelling in lower extremities, anasarca, dizziness, palpitations  GI: No heartburn, indigestion, abdominal pain, has had nausea, vomiting x 1, loss of appetite, possible increased ostomy output  Resp: No shortness of breath with exertion or at rest. No excess mucus, no productive cough, has had non-productive cough, No coughing up of blood.No change in color of mucus.No wheezing.No chest wall deformity  Skin: no rash or lesions.  GU: no dysuria, change in color of urine, no urgency or frequency. No flank pain.  Musculoskeletal: No joint pain or swelling. No decreased range of motion. No back pain.  Psych: has had increased confusion, loss of balance   Past Medical History  Diagnosis Date  .  Arthritis   . Anxiety   . Depression   . Cancer   . Hyperlipidemia   . Colon cancer    Past Surgical History  Procedure Laterality Date  . Colonoscopy    . Colon surgery    . Partial colectomy    . Colostomy    . Fracture surgery      lt ankle   Social History:  reports that he quit smoking about 42 years ago. He quit smokeless tobacco use about 14 years ago. He reports that he does not drink alcohol or use illicit drugs.  No Known Allergies  Family History  Problem Relation Age of Onset  . Cancer Maternal Uncle      Prior to Admission medications   Medication Sig Start Date End Date Taking? Authorizing Provider  aspirin 81 MG EC tablet Take 81 mg by mouth every morning.    Yes Historical Provider, MD  buPROPion (WELLBUTRIN XL) 300 MG 24 hr tablet Take 300 mg by mouth every morning.   Yes Historical Provider, MD  clonazePAM (KLONOPIN) 2 MG tablet Take 2 mg by mouth 3 (three) times daily.    Yes Historical Provider, MD  divalproex (DEPAKOTE ER) 500 MG 24 hr tablet Take 1,500 mg by mouth every morning. 2 tabs per day, 1000 mg total per day 07/25/12  Yes Historical Provider, MD  gabapentin (NEURONTIN) 300 MG capsule Take 300 mg by mouth 3 (three) times daily.    Yes Historical Provider, MD  meloxicam (MOBIC) 7.5 MG tablet Take 7.5 mg by mouth 2 (two)  times daily after a meal.     Yes Historical Provider, MD  pravastatin (PRAVACHOL) 40 MG tablet Take 40 mg by mouth at bedtime. Takes two    Yes Historical Provider, MD  Tetrahydrozoline HCl (VISINE OP) Place 3 drops into both eyes daily as needed (dryness).   Yes Historical Provider, MD   Physical Exam: Filed Vitals:   02/18/13 1545  BP:   Pulse: 78  Temp:   Resp: 20    BP 148/76  Pulse 78  Temp(Src) 97.3 F (36.3 C) (Oral)  Resp 20  Wt 86.183 kg (190 lb)  SpO2 96%  General:  Appears calm and comfortable Eyes: L pupil is larger than R (states that this is chronic), normal lids, irises &conjunctiva ENT: hard of hearing,  dry lips & tongue Neck: no LAD, masses or thyromegaly Cardiovascular: RRR, no m/r/g. No LE edema. Telemetry: SR, no arrhythmias  Respiratory: CTA bilaterally, no w/r/r. Normal respiratory effort. Abdomen: soft, ntnd Skin: no rash or induration seen on limited exam Musculoskeletal: grossly normal tone BUE/BLE, diffusely weak Psychiatric: grossly normal mood and affect, speech fluent and appropriate Neurologic: alert, oriented to person and place not date and can not answer detailed questions, CN's grossly intact (with exception of enlarged L pupil), strength intact and symmetric, finger to nose is slow with tremor and misses, did not get patient up from bed          Labs on Admission:  Basic Metabolic Panel:  Recent Labs Lab 02/18/13 1402  NA 159*  K 5.2  CL 75*  CO2 18*  GLUCOSE 87  BUN 18  CREATININE 0.81  CALCIUM 6.9*   Liver Function Tests:  Recent Labs Lab 02/18/13 1402  AST 49*  ALT 33  ALKPHOS 52  BILITOT 0.6  PROT 6.1  ALBUMIN 2.8*   No results found for this basename: LIPASE, AMYLASE,  in the last 168 hours No results found for this basename: AMMONIA,  in the last 168 hours CBC:  Recent Labs Lab 02/18/13 1402  WBC 8.0  NEUTROABS 4.9  HGB 13.5  HCT 40.2  MCV 90.5  PLT 170   Cardiac Enzymes:  Recent Labs Lab 02/18/13 1417  TROPONINI <0.30    BNP (last 3 results) No results found for this basename: PROBNP,  in the last 8760 hours CBG:  Recent Labs Lab 02/18/13 1428  GLUCAP 87    Radiological Exams on Admission: Ct Head (brain) Wo Contrast  02/18/2013   CLINICAL DATA:  Altered mental status  EXAM: CT HEAD WITHOUT CONTRAST  TECHNIQUE: Contiguous axial images were obtained from the base of the skull through the vertex without intravenous contrast.  COMPARISON:  CT 05/16/2007  FINDINGS: Age-appropriate mild atrophy. This shows mild progression. Negative for acute infarct. Negative for hemorrhage or mass or edema. No skull lesion identified.   IMPRESSION: No acute abnormality.   Electronically Signed   By: Franchot Gallo M.D.   On: 02/18/2013 15:01    EKG: NSR LVH  Assessment/Plan Principal Problem:   Hypernatremia Active Problems:   Altered mental status   Bipolar disorder   History of colon cancer   Colostomy in place   Hyperlipidemia   1. Hypernatremia: Likely due to dehydration/water loss through ostomy, fever.  Check urine osm, serum osm, urine sodium. I suspect that this is chronic (>48 hours) given over a week of symptoms. I do not see that any of his current medications would cause hypernatremia. Will replace water slowly with D5W at 50 ml/hr.  Recheck in 4 hrs. Goal is 4mEq/L drop over 24 hours, or 0.4 per hour. 2. AMS: confusion and ataxia likely due to hypernatremia.  CT head is negative.  3. Biploar disorder: stable. He is unhappy with current regimen and this can be discussed as an outpatient. For now continue welbutrin, klonopin, depakote. 4. History of colon cancer with colostomy in place: does not measure ostomy output at home.  Puts him at risk of increased free water loss due to lack of reabsorptive activity of the colon. Monitor.  5. Pulmonary density: seen on chest xray 02/18/13.  CT is recommended to exclude nodule. 6. Hyperlipidemia: continue statin 7. Dispo: admit to med floor. Heparin for DVT prophylaxis  Code Status: FULL Family Communication: spoke with patient and wife at bedside Disposition Plan: admit to inpatient  Time spent: 50 minutes  [facility] Pager [phone]

## 2013-02-18 NOTE — ED Provider Notes (Signed)
CSN: [id number]     Arrival date & time 02/18/13  1349 History   First MD Initiated Contact with Patient 02/18/13 1501     Chief Complaint  Patient presents with  . stroke/like symptoms    (Consider location/radiation/quality/duration/timing/severity/associated sxs/prior Treatment) HPI Comments: 68 year old male presents with 2-3 days of falls and weakness. The wife seems to think his left side is weaker than his right. Recent years he is a lean to the left. The wife has had to help carry him to the bathroom due to his weakness. He is also seemed confused during this time. Narrative PCPs office earlier today he was hallucinating about having just painted the floor of the physician's office. He has had a cold recently, but no shortness of breath, fevers, or significant diarrhea. He does not drink alcohol. No smoking. He has a remote history of colon cancer with a colostomy, but as far as I know this cancer has resolved. Patient is a benign new medications in 6 months ago he was put on Depakote   Past Medical History  Diagnosis Date  . Arthritis   . Anxiety   . Depression   . Cancer   . Hyperlipidemia   . Colon cancer    Past Surgical History  Procedure Laterality Date  . Colonoscopy    . Colon surgery    . Partial colectomy    . Colostomy    . Fracture surgery      lt ankle   Family History  Problem Relation Age of Onset  . Cancer Maternal Uncle    History  Substance Use Topics  . Smoking status: Former Smoker    Quit date: 05/30/1970  . Smokeless tobacco: Former Systems developer    Quit date: 05/30/1998  . Alcohol Use: No    Review of Systems  Constitutional: Negative for fever.  HENT: Positive for congestion.   Respiratory: Positive for cough. Negative for shortness of breath.   Cardiovascular: Negative for chest pain.  Gastrointestinal: Positive for diarrhea (few loose stools today). Negative for nausea, vomiting and abdominal pain.  Genitourinary: Negative for dysuria, frequency  and decreased urine volume.  Neurological: Positive for weakness. Negative for speech difficulty and headaches.  Psychiatric/Behavioral: Positive for confusion.  All other systems reviewed and are negative.    Allergies  Review of patient's allergies indicates no known allergies.  Home Medications   Current Outpatient Rx  Name  Route  Sig  Dispense  Refill  . aspirin 81 MG EC tablet   Oral   Take 81 mg by mouth every morning.          Marland Kitchen buPROPion (WELLBUTRIN XL) 300 MG 24 hr tablet   Oral   Take 300 mg by mouth every morning.         . clonazePAM (KLONOPIN) 2 MG tablet   Oral   Take 2 mg by mouth 3 (three) times daily.          . divalproex (DEPAKOTE ER) 500 MG 24 hr tablet   Oral   Take 1,500 mg by mouth every morning. 2 tabs per day, 1000 mg total per day         . gabapentin (NEURONTIN) 300 MG capsule   Oral   Take 300 mg by mouth 3 (three) times daily.          . meloxicam (MOBIC) 7.5 MG tablet   Oral   Take 7.5 mg by mouth 2 (two) times daily after a meal.           .  pravastatin (PRAVACHOL) 40 MG tablet   Oral   Take 40 mg by mouth at bedtime. Takes two          . Tetrahydrozoline HCl (VISINE OP)   Both Eyes   Place 3 drops into both eyes daily as needed (dryness).          BP 148/76  Pulse 82  Temp(Src) 97.3 F (36.3 C) (Oral)  Resp 20  SpO2 95% Physical Exam  Nursing note and vitals reviewed. Constitutional: He appears well-developed and well-nourished.  HENT:  Head: Normocephalic and atraumatic.  Right Ear: External ear normal.  Left Ear: External ear normal.  Nose: Nose normal.  Mouth/Throat: No oropharyngeal exudate.  Eyes: EOM are normal. Pupils are equal, round, and reactive to light. Right eye exhibits no discharge. Left eye exhibits no discharge.  Neck: Neck supple.  Cardiovascular: Normal rate, regular rhythm, normal heart sounds and intact distal pulses.   Pulmonary/Chest: Effort normal and breath sounds normal.   Abdominal: Soft. There is no tenderness.  Musculoskeletal: He exhibits no edema.  Neurological: He is alert. He has normal reflexes. He is disoriented. No cranial nerve deficit or sensory deficit. Coordination abnormal. GCS eye subscore is 4. GCS verbal subscore is 4. GCS motor subscore is 6. He displays no Babinski's sign on the right side. He displays no Babinski's sign on the left side.  4/5 strength in left lower extremity. The rest of his strength is normal. On cerebellar testing, patient is able to get from finger to nose but is very wobbly in between.  Skin: Skin is warm and dry.    ED Course  Procedures (including critical care time) Labs Review Labs Reviewed  CBC WITH DIFFERENTIAL - Abnormal; Notable for the following:    Monocytes Relative 18 (*)    Monocytes Absolute 1.4 (*)    All other components within normal limits  COMPREHENSIVE METABOLIC PANEL - Abnormal; Notable for the following:    Sodium 159 (*)    Chloride 75 (*)    CO2 18 (*)    Calcium 6.9 (*)    Albumin 2.8 (*)    AST 49 (*)    GFR calc non Af Amer 89 (*)    All other components within normal limits  PROTIME-INR  APTT  TROPONIN I  GLUCOSE, CAPILLARY  URINALYSIS, ROUTINE W REFLEX MICROSCOPIC  OSMOLALITY  OSMOLALITY, URINE  POCT I-STAT TROPONIN I   Imaging Review Ct Head (brain) Wo Contrast  02/18/2013   CLINICAL DATA:  Altered mental status  EXAM: CT HEAD WITHOUT CONTRAST  TECHNIQUE: Contiguous axial images were obtained from the base of the skull through the vertex without intravenous contrast.  COMPARISON:  CT 05/16/2007  FINDINGS: Age-appropriate mild atrophy. This shows mild progression. Negative for acute infarct. Negative for hemorrhage or mass or edema. No skull lesion identified.  IMPRESSION: No acute abnormality.   Electronically Signed   By: Franchot Gallo M.D.   On: 02/18/2013 15:01    EKG Interpretation    Date/Time:  Thursday February 18 2013 13:55:05 EST Ventricular Rate:  78 PR  Interval:  116 QRS Duration: 86 QT Interval:  378 QTC Calculation: 430 R Axis:   72 Text Interpretation:  Normal sinus rhythm with sinus arrhythmia Minimal voltage criteria for LVH, may be normal variant Nonspecific ST and T wave abnormality Abnormal ECG Changes noted Confirmed by Posey Jasmin  MD, Abby Tucholski ([id number]) on 02/18/2013 3:33:08 PM            MDM  1. Hypernatremia   2. Left-sided weakness    His neurologic symptoms are most likely from the hypernatremia. There is no signs of stroke on the CT scan and his symptoms are over 48 hours. No obvious cause for his hypernatremia. He does not appear clinically dehydrated. He is mildly altered but otherwise is well appearing. D/w hospitalist, she will see patient and admit. Will add on osmolality studies and will hold on fluids until seen by admitting team.    Ephraim Hamburger, MD 02/18/13 [phone]

## 2013-02-19 DIAGNOSIS — I712 Thoracic aortic aneurysm, without rupture, unspecified: Secondary | ICD-10-CM

## 2013-02-19 LAB — BASIC METABOLIC PANEL
BUN: 17 mg/dL (ref 6–23)
CO2: 28 mEq/L (ref 19–32)
CREATININE: 0.95 mg/dL (ref 0.50–1.35)
Calcium: 8.8 mg/dL (ref 8.4–10.5)
Chloride: 100 mEq/L (ref 96–112)
GFR calc Af Amer: >90 mL/min (ref >90)
GFR calc non Af Amer: 83 mL/min — ABNORMAL LOW (ref >90)
Glucose, Bld: 119 mg/dL — ABNORMAL HIGH (ref 70–99)
Potassium: 4.6 mEq/L (ref 3.7–5.3)
Sodium: 140 mEq/L (ref 137–147)

## 2013-02-19 LAB — CBC
HCT: 39.4 % (ref 39.0–52.0)
Hemoglobin: 13.2 g/dL (ref 13.0–17.0)
MCH: 30.7 pg (ref 26.0–34.0)
MCHC: 33.5 g/dL (ref 30.0–36.0)
MCV: 91.6 fL (ref 78.0–100.0)
Platelets: 155 10*3/uL (ref 150–400)
RBC: 4.3 MIL/uL (ref 4.22–5.81)
RDW: 13.7 % (ref 11.5–15.5)
WBC: 6.7 10*3/uL (ref 4.0–10.5)

## 2013-02-19 LAB — GLUCOSE, CAPILLARY: GLUCOSE-CAPILLARY: 109 mg/dL — AB (ref 70–99)

## 2013-02-19 NOTE — Discharge Summary (Signed)
Addendum  Patient seen and examined, chart and data base reviewed.  I agree with the above assessment and plan.  For full details please see Mrs. Imogene Burn PA note.   Birdie Hopes, MD [facility] Pager: [phone] 02/19/2013, 6:20 PM

## 2013-02-19 NOTE — Progress Notes (Signed)
Utilization review completed. Dailey Buccheri, RN, BSN. 

## 2013-02-19 NOTE — Care Management Note (Signed)
    Page 1 of 1   02/19/2013     5:16:56 PM   CARE MANAGEMENT NOTE 02/19/2013  Patient:  Ricky Conley, Ricky Conley   Account Number:  [id number]  Date Initiated:  02/19/2013  Documentation initiated by:  Tomi Bamberger  Subjective/Objective Assessment:   dx hypernatremia  admit- lives with spouse.     Action/Plan:   Anticipated DC Date:  02/19/2013   Anticipated DC Plan:  [place]  CM consult      Choice offered to / List presented to:             Status of service:  Completed, signed off Medicare Important Message given?   (If response is "NO", the following Medicare IM given date fields will be blank) Date Medicare IM given:   Date Additional Medicare IM given:    Discharge Disposition:  HOME/SELF CARE  Per UR Regulation:  Reviewed for med. necessity/level of care/duration of stay  If discussed at [place] of Stay Meetings, dates discussed:    Comments:  02/19/13 17:16  Tomi Bamberger RN, BSN [phone] patient dc'd home, No NCM referral, no needs anticipated.

## 2013-02-19 NOTE — Discharge Instructions (Signed)
Your symptoms came from an elevated sodium level.  Decrease Salt in your diet.  Drink at least 8 eight ounce glasses of water daily.   Please follow up with Dr. Kenton Kingfisher early next week for blood work to ensure your sodium level (salt level) is not increasing again.  Please ask Dr. Kenton Kingfisher if you need a CT Chest to follow up on a possible lung nodule seen on Chest xray in the hospital.

## 2013-02-19 NOTE — Discharge Summary (Signed)
Physician Discharge Summary  Ricky Conley [id number] DOB: Dec 05, 1945 DOA: 02/18/2013  PCP: Shirline Frees, MD  Admit date: 02/18/2013 Discharge date: 02/19/2013  Time spent: 50 minutes  Recommendations for Outpatient Follow-up:  1. Check Sodium level on Monday or Tuesday of next week 1/12. 2. Patient to increase water intake and decrease salt intake. 3.  Patient to follow up with psychiatry as previously scheduled. 4.  PCP to order CT Chest to follow up on possible pulmonary nodule.  Discharge Diagnoses:  Principal Problem:   Hypernatremia Active Problems:   Altered mental status   Bipolar disorder   History of colon cancer   Colostomy in place   Hyperlipidemia   Discharge Condition: much improved.  Stable.  Able to ambulate  Diet recommendation: Low salt Heart Healthy.  Filed Weights   02/18/13 1609  Weight: 86.183 kg (190 lb)    History of present illness:  Ricky Conley is a 68 y.o. 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 He went to his PCP office today for these symptoms and was found to have a serum sodium level of 159 and was advised to come to the hospital. The history is provided mainly by his wife. She states that he has been feeling weak, has not been eating much and but denies increased output from his ostomy. He states that he has had one episode of emesis. He has not had any measured fevers, but has had chills. She has noticed for the past few days he has had a stooped over shuffling gate and poor balance   Hospital Course:   Hypernatremia (159)  Elevated sodium levels caused altered mental status  Urine osmolality was 803, Urine sodium was 51  Patient was treated with D5W at 50 ml/hour and his sodium dropped to 140 on the am of discharge.  Patient's wife felt elevated sodium was due to medications (particularly  Depakote) but we assured her this does not appear to be the case.  Symptoms of confusion, shuffling gate, and falling have resolved.  Recommend increase water intake, heart healthy diet  Recommend bmet at Dr. Kenton Kingfisher' office on Monday.  Altered mental status with confusion  CT Head was negative  Symptoms resolved with IV fluids.  Bipolar Disorder  stable.   He is unhappy with current regimen and this can be discussed as an outpatient with his psychiatrist  For now continue welbutrin, klonopin, depakote  History of colon cancer (dx 15 yrs ago) with colostomy in place  Stable  Would recommend monitoring colostomy output  Pulmonary density seen on CXR   02/18/13 xray with density in Left Lung Base  Outpatient CT recommended to rule out pulmonary nodule - metastatic disease.   Discharge Exam: Filed Vitals:   02/19/13 0657  BP: 175/90  Pulse: 66  Temp: 97.9 F (36.6 C)  Resp: 16    General: Awake, orientated, sitting in chair, family at bedside.  Cardiovascular: rrr no m/r/g Respiratory: cta, no w/c/r, no accessory muscle use. Abdomen:  Soft, nt, nd, +bs, Colostomy in place with minimal stool in bag. Extremities:  Able to stand without difficulty or dizziness.  Discharge Instructions      Discharge Orders   Future Orders Complete By Expires   Diet - low sodium heart healthy  As directed    Increase activity slowly  As directed  Medication List         aspirin 81 MG EC tablet  Take 81 mg by mouth every morning.     buPROPion 300 MG 24 hr tablet  Commonly known as:  WELLBUTRIN XL  Take 300 mg by mouth every morning.     clonazePAM 2 MG tablet  Commonly known as:  KLONOPIN  Take 2 mg by mouth 3 (three) times daily.     divalproex 500 MG 24 hr tablet  Commonly known as:  DEPAKOTE ER  Take 1,500 mg by mouth every morning.     gabapentin 300 MG capsule  Commonly known as:  NEURONTIN  Take 300 mg by mouth 3 (three) times daily.     meloxicam  7.5 MG tablet  Commonly known as:  MOBIC  Take 7.5 mg by mouth 2 (two) times daily after a meal.     pravastatin 40 MG tablet  Commonly known as:  PRAVACHOL  Take 40 mg by mouth at bedtime. Takes two     VISINE OP  Place 3 drops into both eyes daily as needed (dryness).       No Known Allergies Follow-up Information   Follow up with Shirline Frees, MD. Schedule an appointment as soon as possible for a visit in 4 days. (Check sodium level on Monday 02/22/13)    Specialty:  Family Medicine   Contact information:   [address], [place] [address] [phone]       Follow up with Psychiatrist. Schedule an appointment as soon as possible for a visit in 1 week. (Follow up with Psychiatry as previously scheduled.)        The results of significant diagnostics from this hospitalization (including imaging, microbiology, ancillary and laboratory) are listed below for reference.    Significant Diagnostic Studies: Dg Chest 2 View  02/18/2013   CLINICAL DATA:  Cough, weakness, history of colorectal cancer  EXAM: CHEST  2 VIEW  COMPARISON:  05/16/2007  FINDINGS: Normal heart size, mediastinal contours, and pulmonary vascularity.  Lungs clear.  No pleural effusion or pneumothorax.  Mild scattered endplate spur formation thoracic spine.  Questionable subtle nodular density versus artifact at lateral left base.  IMPRESSION: Questionable subtle nodular density lateral left lung base in patient with history of colorectal cancer; CT recommended to exclude pulmonary nodule.   Electronically Signed   By: Lavonia Dana M.D.   On: 02/18/2013 16:33   Ct Head (brain) Wo Contrast  02/18/2013   CLINICAL DATA:  Altered mental status  EXAM: CT HEAD WITHOUT CONTRAST  TECHNIQUE: Contiguous axial images were obtained from the base of the skull through the vertex without intravenous contrast.  COMPARISON:  CT 05/16/2007  FINDINGS: Age-appropriate mild atrophy. This shows mild progression. Negative  for acute infarct. Negative for hemorrhage or mass or edema. No skull lesion identified.  IMPRESSION: No acute abnormality.   Electronically Signed   By: Franchot Gallo M.D.   On: 02/18/2013 15:01      Labs: Basic Metabolic Panel:  Recent Labs Lab 02/18/13 1402 02/18/13 2045 02/19/13 0644  NA 159* 142 140  K 5.2 4.1 4.6  CL 75* 101 100  CO2 18* 30 28  GLUCOSE 87 180* 119*  BUN 18 18 17   CREATININE 0.81 0.98 0.95  CALCIUM 6.9* 8.7 8.8   Liver Function Tests:  Recent Labs Lab 02/18/13 1402  AST 49*  ALT 33  ALKPHOS 52  BILITOT 0.6  PROT 6.1  ALBUMIN 2.8*  CBC:  Recent Labs Lab 02/18/13 1402 02/19/13 0644  WBC 8.0 6.7  NEUTROABS 4.9  --   HGB 13.5 13.2  HCT 40.2 39.4  MCV 90.5 91.6  PLT 170 155   Cardiac Enzymes:  Recent Labs Lab 02/18/13 1417  TROPONINI <0.30     Recent Labs Lab 02/18/13 1428 02/18/13 1542  GLUCAP 87 109*    SignedKaren Kitchens [phone]  [facility] 02/19/2013, 1:18 PM

## 2013-02-19 NOTE — Progress Notes (Signed)
Ricky Conley to be D/C'd Home per MD order.  Discussed with the patient and all questions fully answered.    Medication List         aspirin 81 MG EC tablet  Take 81 mg by mouth every morning.     buPROPion 300 MG 24 hr tablet  Commonly known as:  WELLBUTRIN XL  Take 300 mg by mouth every morning.     clonazePAM 2 MG tablet  Commonly known as:  KLONOPIN  Take 2 mg by mouth 3 (three) times daily.     divalproex 500 MG 24 hr tablet  Commonly known as:  DEPAKOTE ER  Take 1,500 mg by mouth every morning.     gabapentin 300 MG capsule  Commonly known as:  NEURONTIN  Take 300 mg by mouth 3 (three) times daily.     meloxicam 7.5 MG tablet  Commonly known as:  MOBIC  Take 7.5 mg by mouth 2 (two) times daily after a meal.     pravastatin 40 MG tablet  Commonly known as:  PRAVACHOL  Take 40 mg by mouth at bedtime. Takes two     VISINE OP  Place 3 drops into both eyes daily as needed (dryness).        VVS, Skin clean, dry and intact without evidence of skin break down, no evidence of skin tears noted. IV catheter discontinued intact. Site without signs and symptoms of complications. Dressing and pressure applied.  An After Visit Summary was printed and given to the patient. Follow up appointments , new prescriptions and medication administration times given. Education and handout given to pt on hypernatremia. All questions answered Patient escorted via [place], and D/C home via private auto.  Park Breed, RN 02/19/2013 1:50 PM

## 2013-03-05 ENCOUNTER — Other Ambulatory Visit: Payer: Self-pay | Admitting: Family Medicine

## 2013-03-05 DIAGNOSIS — R911 Solitary pulmonary nodule: Secondary | ICD-10-CM

## 2013-03-08 ENCOUNTER — Ambulatory Visit
Admission: RE | Admit: 2013-03-08 | Discharge: 2013-03-08 | Disposition: A | Discharge status: Home / Self Care | Payer: Medicare Other | Source: Ambulatory Visit | Attending: Family Medicine | Admitting: Family Medicine

## 2013-03-08 DIAGNOSIS — R911 Solitary pulmonary nodule: Secondary | ICD-10-CM

## 2013-03-08 MED ORDER — IOHEXOL 300 MG/ML  SOLN
75.0000 mL | Freq: Once | INTRAMUSCULAR | Status: AC | PRN
  Administered 2013-03-08: 75 mL via INTRAVENOUS

## 2013-03-22 ENCOUNTER — Inpatient Hospital Stay: Payer: Medicare Other

## 2013-03-22 ENCOUNTER — Inpatient Hospital Stay: Payer: Medicare Other | Admitting: Internal Medicine

## 2013-03-22 ENCOUNTER — Emergency Department: Payer: Medicare Other

## 2013-03-22 ENCOUNTER — Inpatient Hospital Stay
Admission: EM | Admit: 2013-03-22 | Discharge: 2013-03-25 | DRG: 418 | Disposition: A | Discharge status: Home / Self Care | Payer: Medicare Other | Attending: Surgery | Admitting: Surgery

## 2013-03-22 DIAGNOSIS — E119 Type 2 diabetes mellitus without complications: Secondary | ICD-10-CM | POA: Diagnosis present

## 2013-03-22 DIAGNOSIS — Z87891 Personal history of nicotine dependence: Secondary | ICD-10-CM

## 2013-03-22 DIAGNOSIS — K8689 Other specified diseases of pancreas: Secondary | ICD-10-CM | POA: Diagnosis present

## 2013-03-22 DIAGNOSIS — Z7982 Long term (current) use of aspirin: Secondary | ICD-10-CM

## 2013-03-22 DIAGNOSIS — Z8546 Personal history of malignant neoplasm of prostate: Secondary | ICD-10-CM

## 2013-03-22 DIAGNOSIS — K8050 Calculus of bile duct without cholangitis or cholecystitis without obstruction: Secondary | ICD-10-CM | POA: Diagnosis present

## 2013-03-22 DIAGNOSIS — K838 Other specified diseases of biliary tract: Secondary | ICD-10-CM | POA: Diagnosis present

## 2013-03-22 DIAGNOSIS — E860 Dehydration: Secondary | ICD-10-CM | POA: Diagnosis present

## 2013-03-22 DIAGNOSIS — K861 Other chronic pancreatitis: Secondary | ICD-10-CM | POA: Diagnosis present

## 2013-03-22 DIAGNOSIS — K831 Obstruction of bile duct: Secondary | ICD-10-CM

## 2013-03-22 DIAGNOSIS — K819 Cholecystitis, unspecified: Secondary | ICD-10-CM

## 2013-03-22 DIAGNOSIS — N179 Acute kidney failure, unspecified: Secondary | ICD-10-CM | POA: Diagnosis present

## 2013-03-22 DIAGNOSIS — I10 Essential (primary) hypertension: Secondary | ICD-10-CM | POA: Diagnosis present

## 2013-03-22 DIAGNOSIS — K8001 Calculus of gallbladder with acute cholecystitis with obstruction: Principal | ICD-10-CM | POA: Diagnosis present

## 2013-03-22 HISTORY — DX: Type 2 diabetes mellitus without complications: E11.9

## 2013-03-22 LAB — CBC AND DIFFERENTIAL
Basophils %: 0.2 % (ref 0.0–3.0)
Basophils Absolute: 0 10*3/uL (ref 0.0–0.3)
Eosinophils %: 0.3 % (ref 0.0–7.0)
Eosinophils Absolute: 0 10*3/uL (ref 0.0–0.8)
Hematocrit: 41 % (ref 39.0–52.5)
Hemoglobin: 14.2 gm/dL (ref 13.0–17.5)
Lymphocytes Absolute: 1.1 10*3/uL (ref 0.6–5.1)
Lymphocytes: 8.7 % — ABNORMAL LOW (ref 15.0–46.0)
MCH: 29 pg (ref 28–35)
MCHC: 35 gm/dL (ref 32–36)
MCV: 85 fL (ref 80–100)
MPV: 6.4 fL (ref 6.0–10.0)
Monocytes Absolute: 0.7 10*3/uL (ref 0.1–1.7)
Monocytes: 5.7 % (ref 3.0–15.0)
Neutrophils %: 85.1 % — ABNORMAL HIGH (ref 42.0–78.0)
Neutrophils Absolute: 10.5 10*3/uL — ABNORMAL HIGH (ref 1.7–8.6)
PLT CT: 304 10*3/uL (ref 130–440)
RBC: 4.84 10*6/uL (ref 4.00–5.70)
RDW: 12 % (ref 11.0–14.0)
WBC: 12.4 10*3/uL — ABNORMAL HIGH (ref 4.0–11.0)

## 2013-03-22 LAB — ECG 12-LEAD
P Wave Axis: 68 deg
P Wave Duration: 138 ms
P-R Interval: 152 ms
Patient Age: 67 years
Q-T Dispersion: 36 ms
Q-T Interval(Corrected): 406 ms
Q-T Interval: 374 ms
QRS Axis: -26 deg
QRS Duration: 84 ms
T Axis: 50 deg
Ventricular Rate: 71 /min

## 2013-03-22 LAB — BASIC METABOLIC PANEL
Anion Gap: 18.3 mMol/L — ABNORMAL HIGH (ref 7.0–18.0)
BUN / Creatinine Ratio: 13.4 Ratio (ref 10.0–30.0)
BUN: 18 mg/dL (ref 7–22)
CO2: 20 mMol/L (ref 20.0–30.0)
Calcium: 10 mg/dL (ref 8.5–10.5)
Chloride: 103 mMol/L (ref 98–110)
Creatinine: 1.34 mg/dL — ABNORMAL HIGH (ref 0.80–1.30)
EGFR: 53 mL/min/{1.73_m2}
Glucose: 176 mg/dL — ABNORMAL HIGH (ref 70–99)
Osmolality Calc: 280 mOsm/kg (ref 275–300)
Potassium: 4.3 mMol/L (ref 3.5–5.3)
Sodium: 137 mMol/L (ref 136–147)

## 2013-03-22 LAB — LIPASE: Lipase: 36 U/L (ref 8–78)

## 2013-03-22 LAB — HEPATIC FUNCTION PANEL
ALT: 14 U/L (ref 0–55)
AST (SGOT): 16 U/L (ref 10–42)
Albumin/Globulin Ratio: 1.2 Ratio (ref 0.70–1.50)
Albumin: 4.2 gm/dL (ref 3.5–5.0)
Alkaline Phosphatase: 78 U/L (ref 40–145)
Bilirubin Direct: 0.4 mg/dL — ABNORMAL HIGH (ref 0.0–0.3)
Bilirubin, Total: 0.9 mg/dL (ref 0.1–1.2)
Globulin: 3.5 gm/dL (ref 2.0–4.0)
Protein, Total: 7.7 gm/dL (ref 6.0–8.3)

## 2013-03-22 LAB — VH DEXTROSE STICK GLUCOSE
Glucose POCT: 177 mg/dL — ABNORMAL HIGH (ref 70–99)
Glucose POCT: 159 mg/dL — ABNORMAL HIGH (ref 70–99)

## 2013-03-22 MED ORDER — MORPHINE SULFATE 2 MG/ML IJ/IV SOLN (WRAP)
4.0000 mg | Freq: Once | Status: AC
Start: 2013-03-22 — End: 2013-03-22
  Administered 2013-03-22: 4 mg via INTRAVENOUS

## 2013-03-22 MED ORDER — VH HYDROMORPHONE HCL PF 1 MG/ML CARPUJECT
0.5000 mg | INTRAMUSCULAR | Status: DC | PRN
Start: 2013-03-22 — End: 2013-03-25
  Administered 2013-03-22 – 2013-03-24 (×7): 0.5 mg via INTRAVENOUS
  Filled 2013-03-22 (×7): qty 1

## 2013-03-22 MED ORDER — MORPHINE SULFATE (PF) 2 MG/ML IV SOLN
INTRAVENOUS | Status: AC
Start: 2013-03-22 — End: ?
  Filled 2013-03-22: qty 2

## 2013-03-22 MED ORDER — GLUCAGON HCL (RDNA) 1 MG IJ SOLR
1.0000 mg | INTRAMUSCULAR | Status: DC | PRN
Start: 2013-03-22 — End: 2013-03-25

## 2013-03-22 MED ORDER — ONDANSETRON HCL 4 MG/2ML IJ SOLN
4.0000 mg | Freq: Three times a day (TID) | INTRAMUSCULAR | Status: DC | PRN
Start: 2013-03-22 — End: 2013-03-25

## 2013-03-22 MED ORDER — PRAVASTATIN SODIUM 40 MG PO TABS
40.0000 mg | ORAL_TABLET | Freq: Every evening | ORAL | Status: DC
Start: 2013-03-22 — End: 2013-03-25
  Administered 2013-03-22 – 2013-03-24 (×3): 40 mg via ORAL
  Filled 2013-03-22 (×4): qty 1

## 2013-03-22 MED ORDER — KETOROLAC TROMETHAMINE 15 MG/ML IJ SOLN
INTRAMUSCULAR | Status: AC
Start: 2013-03-22 — End: ?
  Filled 2013-03-22: qty 1

## 2013-03-22 MED ORDER — ACETAMINOPHEN 325 MG PO TABS
650.0000 mg | ORAL_TABLET | ORAL | Status: DC | PRN
Start: 2013-03-22 — End: 2013-03-25

## 2013-03-22 MED ORDER — SODIUM CHLORIDE 0.9 % IV SOLN
INTRAVENOUS | Status: DC
Start: 2013-03-22 — End: 2013-03-24

## 2013-03-22 MED ORDER — KETOROLAC TROMETHAMINE 30 MG/ML IJ SOLN
15.0000 mg | Freq: Once | INTRAMUSCULAR | Status: AC
Start: 2013-03-22 — End: 2013-03-22
  Administered 2013-03-22: 15 mg via INTRAVENOUS

## 2013-03-22 MED ORDER — SODIUM CHLORIDE 0.9 % IJ SOLN
3.0000 mL | Freq: Three times a day (TID) | INTRAMUSCULAR | Status: DC
Start: 2013-03-22 — End: 2013-03-25
  Administered 2013-03-24 – 2013-03-25 (×3): 3 mL via INTRAVENOUS

## 2013-03-22 MED ORDER — GLUCOSE 40 % PO GEL
15.0000 g | ORAL | Status: DC | PRN
Start: 2013-03-22 — End: 2013-03-25

## 2013-03-22 MED ORDER — INSULIN ASPART 100 UNIT/ML SC SOPN
1.0000 [IU] | PEN_INJECTOR | Freq: Three times a day (TID) | SUBCUTANEOUS | Status: DC
Start: 2013-03-23 — End: 2013-03-25
  Administered 2013-03-25: 1 [IU] via SUBCUTANEOUS
  Administered 2013-03-25: 3 [IU] via SUBCUTANEOUS
  Filled 2013-03-22: qty 3

## 2013-03-22 MED ORDER — LISINOPRIL 5 MG PO TABS
2.5000 mg | ORAL_TABLET | Freq: Every day | ORAL | Status: DC
Start: 2013-03-23 — End: 2013-03-25
  Administered 2013-03-25: 2.5 mg via ORAL
  Filled 2013-03-22 (×3): qty 1

## 2013-03-22 MED ORDER — VH HYDROMORPHONE HCL 1 MG/ML (NARRATOR)
INTRAMUSCULAR | Status: AC
Start: 2013-03-22 — End: ?
  Filled 2013-03-22: qty 1

## 2013-03-22 MED ORDER — SODIUM CHLORIDE 0.9 % IJ SOLN
0.4000 mg | INTRAMUSCULAR | Status: DC | PRN
Start: 2013-03-22 — End: 2013-03-25

## 2013-03-22 MED ORDER — VH HYDROMORPHONE HCL PF 1 MG/ML CARPUJECT
0.5000 mg | Freq: Once | INTRAMUSCULAR | Status: AC
Start: 2013-03-22 — End: 2013-03-22
  Administered 2013-03-22: 0.5 mg via INTRAVENOUS

## 2013-03-22 MED ORDER — IOHEXOL 240 MG/ML IJ SOLN
50.0000 mL | Freq: Once | INTRAMUSCULAR | Status: AC
Start: 2013-03-22 — End: 2013-03-22
  Administered 2013-03-22: 50 mL via ORAL

## 2013-03-22 MED ORDER — DEXTROSE 10 % IV BOLUS
125.0000 mL | INTRAVENOUS | Status: DC | PRN
Start: 2013-03-22 — End: 2013-03-25

## 2013-03-22 MED ORDER — HEPARIN SODIUM (PORCINE) PF 5000 UNIT/0.5ML IJ SOLN
5000.0000 [IU] | Freq: Three times a day (TID) | INTRAMUSCULAR | Status: DC
Start: 2013-03-22 — End: 2013-03-25
  Administered 2013-03-22 – 2013-03-24 (×5): 5000 [IU] via SUBCUTANEOUS
  Filled 2013-03-22 (×11): qty 0.5

## 2013-03-22 MED ORDER — IOHEXOL 350 MG/ML IV SOLN
100.0000 mL | Freq: Once | INTRAVENOUS | Status: AC | PRN
Start: 2013-03-22 — End: 2013-03-22
  Administered 2013-03-22: 100 mL via INTRAVENOUS

## 2013-03-22 MED ORDER — ONDANSETRON HCL 4 MG/2ML IJ SOLN
4.0000 mg | Freq: Once | INTRAMUSCULAR | Status: AC
Start: 2013-03-22 — End: 2013-03-22
  Administered 2013-03-22: 4 mg via INTRAVENOUS

## 2013-03-22 MED ORDER — PANTOPRAZOLE SODIUM 40 MG PO TBEC
40.0000 mg | DELAYED_RELEASE_TABLET | Freq: Every morning | ORAL | Status: DC
Start: 2013-03-23 — End: 2013-03-25
  Administered 2013-03-23 – 2013-03-25 (×3): 40 mg via ORAL
  Filled 2013-03-22 (×3): qty 1

## 2013-03-22 MED ORDER — ONDANSETRON HCL 4 MG/2ML IJ SOLN
INTRAMUSCULAR | Status: AC
Start: 2013-03-22 — End: ?
  Filled 2013-03-22: qty 2

## 2013-03-22 NOTE — ED Provider Notes (Addendum)
Physician/Midlevel provider first contact with patient: 03/22/13 1337         History     Chief Complaint   Patient presents with   . Abdominal Pain     HPI  Patient complains of abdominal pain. Patient states yesterday she had increasing right upper quadrant abdominal pain and nausea and vomiting. Been quite severe in the last day. Is not able to eat or drink anything. He said no fevers or chills. He's had no diarrhea. Pain is sharp and radiates through to his back. Seems to be worse with eating.  Past Medical History   Diagnosis Date   . Diabetes mellitus        Past Surgical History   Procedure Date   . Egd 03/23/2013     Procedure: EGD;  Surgeon: Jayme Cloud, MD;  Location: [facility];  Service: Gastroenterology;  Laterality: N/A;   . Laparoscopic, cholecystectomy, cholangiogram 03/24/2013     Procedure: LAPAROSCOPIC, CHOLECYSTECTOMY, CHOLANGIOGRAM;  Surgeon: Leory Plowman, MD;  Location: [facility];  Service: General;  Laterality: N/A;  LAP CHOLE    ROOM 516         History reviewed. No pertinent family history.    Social  History   Substance Use Topics   . Smoking status: Former Games developer   . Smokeless tobacco: Not on file   . Alcohol Use: No       .     No Known Allergies    Discharge Medication List as of 03/25/2013  3:07 PM      CONTINUE these medications which have NOT CHANGED    Details   aspirin EC 81 MG EC tablet Take by mouth daily., Until Discontinued, Historical Med      glipiZIDE (GLUCOTROL) 10 MG tablet Starting 01/18/2013, Until Discontinued, Historical Med      JANUVIA 100 MG tablet Starting 03/09/2013, Until Discontinued, Historical Med      lisinopril (PRINIVIL,ZESTRIL) 2.5 MG tablet Starting 02/09/2013, Until Discontinued, Historical Med      metFORMIN (GLUCOPHAGE) 500 MG tablet Starting 12/22/2012, Until Discontinued, Historical Med      omeprazole (PRILOSEC) 40 MG capsule Starting 01/18/2013, Until Discontinued, Historical Med      pravastatin (PRAVACHOL) 40 MG tablet Starting  01/18/2013, Until Discontinued, Historical Med      ranitidine (ZANTAC) 150 MG tablet Starting 02/15/2013, Until Discontinued, Historical Med              Review of Systems   Constitutional: Negative for fever, chills, activity change and fatigue.   HENT: Negative for congestion, sore throat and voice change.    Eyes: Negative for visual disturbance.   Respiratory: Negative for cough, chest tightness and shortness of breath.    Cardiovascular: Negative for chest pain.   Gastrointestinal: Positive for nausea, vomiting and abdominal pain. Negative for diarrhea.   Genitourinary: Negative for dysuria and frequency.   Musculoskeletal: Negative for back pain and neck pain.   Skin: Negative for rash.   Neurological: Negative for syncope, numbness and headaches.   Hematological: Negative for adenopathy.   Psychiatric/Behavioral: The patient is not nervous/anxious.        Physical Exam    BP: 143/79 mmHg, Heart Rate: 107 , Temp: 98.4 F (36.9 C), Resp Rate: 18 , SpO2: 100 %, Weight: 83 kg    Physical Exam   Nursing note and vitals reviewed.  Constitutional: He is oriented to person, place, and time. He appears well-developed and well-nourished. He is active and  cooperative. He appears distressed.   HENT:   Head: Normocephalic and atraumatic.   Right Ear: Hearing, tympanic membrane, external ear and ear canal normal.   Left Ear: Hearing, tympanic membrane, external ear and ear canal normal.   Nose: Nose normal.   Mouth/Throat: Uvula is midline, oropharynx is clear and moist and mucous membranes are normal.   Eyes: Conjunctivae normal, EOM and lids are normal. Pupils are equal, round, and reactive to light.   Neck: Trachea normal, normal range of motion, full passive range of motion without pain and phonation normal. Neck supple.   Cardiovascular: Normal rate, regular rhythm, normal heart sounds and intact distal pulses.    No murmur heard.  Pulmonary/Chest: Effort normal and breath sounds normal. He has no wheezes.   Abdominal:  Soft. Normal appearance and bowel sounds are normal. He exhibits no distension. There is tenderness. There is no rebound and no guarding.       Musculoskeletal: Normal range of motion. He exhibits no edema.   Neurological: He is alert and oriented to person, place, and time. He has normal strength. No cranial nerve deficit.   Skin: Skin is warm, dry and intact. No rash noted.   Psychiatric: He has a normal mood and affect. His behavior is normal.       MDM and ED Course     ED Medication Orders      Start     Status Ordering Provider    03/22/13 1848   iohexol (OMNIPAQUE) 240 MG/ML IV/ORAL solution 50 mL   Once      Route: Oral  Ordered Dose: 50 mL         Last MAR action:  Given Cala Bradford    03/22/13 1650   ketorolac (TORADOL) injection 15 mg   Once in ED      Route: Intravenous  Ordered Dose: 15 mg         Last MAR action:  Given Jaylyne Breese K    03/22/13 1650   HYDROmorphone (DILAUDID) injection 0.5 mg   Once in ED      Route: Intravenous  Ordered Dose: 0.5 mg         Last MAR action:  Given Wyona Almas    03/22/13 1508   morphine injection 4 mg   Once in ED      Route: Intravenous  Ordered Dose: 4 mg         Last MAR action:  Given Ell Tiso K    03/22/13 1406   ondansetron (ZOFRAN) injection 4 mg   Once in ED      Route: Intravenous  Ordered Dose: 4 mg         Last MAR action:  Given Moustafa Mossa K    03/22/13 1406   morphine injection 4 mg   Once in ED      Route: Intravenous  Ordered Dose: 4 mg         Last MAR action:  Given Dallana Mavity K               Results for orders placed during the hospital encounter of 03/22/13   CBC AND DIFFERENTIAL       Component Value Range    WBC 12.4 (*) 4.0 - 11.0 K/cmm    RBC 4.84  4.00 - 5.70 M/cmm    Hemoglobin 14.2  13.0 - 17.5 gm/dL    Hematocrit 60.4  54.0 - 52.5 %    MCV  85  80 - 100 fL    MCH 29  28 - 35 pg    MCHC 35  32 - 36 gm/dL    RDW 16.1  09.6 - 04.5 %    PLT CT 304  130 - 440 K/cmm    MPV 6.4  6.0 - 10.0 fL    NEUTROPHIL % 85.1 (*) 42.0 - 78.0 %     Lymphocytes 8.7 (*) 15.0 - 46.0 %    Monocytes 5.7  3.0 - 15.0 %    Eosinophils % 0.3  0.0 - 7.0 %    Basophils % 0.2  0.0 - 3.0 %    Neutrophils Absolute 10.5 (*) 1.7 - 8.6 K/cmm    Lymphocytes Absolute 1.1  0.6 - 5.1 K/cmm    Monocytes Absolute 0.7  0.1 - 1.7 K/cmm    Eosinophils Absolute 0.0  0.0 - 0.8 K/cmm    BASO Absolute 0.0  0.0 - 0.3 K/cmm   BASIC METABOLIC PANEL       Component Value Range    Sodium 137  136 - 147 mMol/L    Potassium 4.3  3.5 - 5.3 mMol/L    Chloride 103  98 - 110 mMol/L    CO2 20.0  20.0 - 30.0 mMol/L    CALCIUM 10.0  8.5 - 10.5 mg/dL    Glucose 409 (*) 70 - 99 mg/dL    Creatinine 8.11 (*) 0.80 - 1.30 mg/dL    BUN 18  7 - 22 mg/dL    Anion Gap 91.4 (*) 7.0 - 18.0 mMol/L    BUN/Creatinine Ratio 13.4  10.0 - 30.0 Ratio    EGFR 53      Osmolality Calc 280  275 - 300 mOsm/kg   LIPASE       Component Value Range    Lipase 36  8 - 78 U/L   HEPATIC FUNCTION PANEL       Component Value Range    Protein, Total 7.7  6.0 - 8.3 gm/dL    Albumin 4.2  3.5 - 5.0 gm/dL    Alkaline Phosphatase 78  40 - 145 U/L    ALT 14  0 - 55 U/L    AST (SGOT) 16  10 - 42 U/L    Bilirubin, Total 0.9  0.1 - 1.2 mg/dL    Bilirubin, Direct 0.4 (*) 0.0 - 0.3 mg/dL    Albumin/Globulin Ratio 1.20  0.70 - 1.50 Ratio    Globulin 3.5  2.0 - 4.0 gm/dL   VH DEXTROSE STICK GLUCOSE       Component Value Range    Glucose, POCT 177 (*) 70 - 99 mg/dL   VH DEXTROSE STICK GLUCOSE       Component Value Range    Glucose, POCT 159 (*) 70 - 99 mg/dL   BASIC METABOLIC PANEL       Component Value Range    Sodium 133 (*) 136 - 147 mMol/L    Potassium 4.8  3.5 - 5.3 mMol/L    Chloride 103  98 - 110 mMol/L    CO2 20.3  20.0 - 30.0 mMol/L    CALCIUM 9.0  8.5 - 10.5 mg/dL    Glucose 782 (*) 70 - 99 mg/dL    Creatinine 9.56  2.13 - 1.30 mg/dL    BUN 18  7 - 22 mg/dL    Anion Gap 08.6  7.0 - 18.0 mMol/L    BUN/Creatinine Ratio 16.1  10.0 - 30.0 Ratio  EGFR >60      Osmolality Calc 273 (*) 275 - 300 mOsm/kg   CBC       Component Value Range    WBC  11.6 (*) 4.0 - 11.0 K/cmm    RBC 4.69  4.00 - 5.70 M/cmm    Hemoglobin 13.9  13.0 - 17.5 gm/dL    Hematocrit 16.1  09.6 - 52.5 %    MCV 85  80 - 100 fL    MCH 30  28 - 35 pg    MCHC 35  32 - 36 gm/dL    RDW 04.5  40.9 - 81.1 %    PLT CT 291  130 - 440 K/cmm    MPV 6.4  6.0 - 10.0 fL   CEA       Component Value Range    CEA-Abbott 1.91  0.00 - 3.00 ng/mL   CANCER ANTIGEN 19-9       Component Value Range    CA 19-9 41 (*) <34 U/mL   VH DEXTROSE STICK GLUCOSE       Component Value Range    Glucose, POCT 187 (*) 70 - 99 mg/dL   LIPASE       Component Value Range    Lipase 62  8 - 78 U/L   VH DEXTROSE STICK GLUCOSE       Component Value Range    Glucose, POCT 147 (*) 70 - 99 mg/dL   VH DEXTROSE STICK GLUCOSE       Component Value Range    Glucose, POCT 133 (*) 70 - 99 mg/dL   VH DEXTROSE STICK GLUCOSE       Component Value Range    Glucose, POCT 111 (*) 70 - 99 mg/dL   BASIC METABOLIC PANEL       Component Value Range    Sodium 135 (*) 136 - 147 mMol/L    Potassium 4.4  3.5 - 5.3 mMol/L    Chloride 103  98 - 110 mMol/L    CO2 19.1 (*) 20.0 - 30.0 mMol/L    CALCIUM 8.8  8.5 - 10.5 mg/dL    Glucose 914 (*) 70 - 99 mg/dL    Creatinine 7.82  9.56 - 1.30 mg/dL    BUN 12  7 - 22 mg/dL    Anion Gap 21.3  7.0 - 18.0 mMol/L    BUN/Creatinine Ratio 13.2  10.0 - 30.0 Ratio    EGFR >60      Osmolality Calc 272 (*) 275 - 300 mOsm/kg   CBC       Component Value Range    WBC 9.8  4.0 - 11.0 K/cmm    RBC 4.62  4.00 - 5.70 M/cmm    Hemoglobin 13.8  13.0 - 17.5 gm/dL    Hematocrit 08.6  57.8 - 52.5 %    MCV 85  80 - 100 fL    MCH 30  28 - 35 pg    MCHC 35  32 - 36 gm/dL    RDW 46.9  62.9 - 52.8 %    PLT CT 280  130 - 440 K/cmm    MPV 6.5  6.0 - 10.0 fL   VH DEXTROSE STICK GLUCOSE       Component Value Range    Glucose, POCT 118 (*) 70 - 99 mg/dL   [facility] SURGICAL PATHOLOGY    Narrative:     Clinical Diagnosis:  Biliary pancreatitis  PostOp:  Same as preop  Specimen:  GALLBLADDER  Gross Description:  The specimen consists of a gallbladder  measuring 8.6 cm.  The specimen is opened revealing bile but no calculi.  The mucosa is notable for cholesterolosis.  Representative sections are submitted in one cassette.     03/24/13  CLM/lr  Diagnosis:  GALLBLADDER, CHOLECYSTECTOMY:  -ACUTE CHOLECYSTITIS WITH FOCAL NECROSIS.    Code:  5, 7  ac  Signature on File:  Catherine L. Clide Deutscher, M.D.   [facility] DEXTROSE STICK GLUCOSE       Component Value Range    Glucose, POCT 146 (*) 70 - 99 mg/dL   VH DEXTROSE STICK GLUCOSE       Component Value Range    Glucose, POCT 151 (*) 70 - 99 mg/dL   VH DEXTROSE STICK GLUCOSE       Component Value Range    Glucose, POCT 245 (*) 70 - 99 mg/dL   BASIC METABOLIC PANEL       Component Value Range    Sodium 135 (*) 136 - 147 mMol/L    Potassium 4.8  3.5 - 5.3 mMol/L    Chloride 104  98 - 110 mMol/L    CO2 23.8  20.0 - 30.0 mMol/L    CALCIUM 8.7  8.5 - 10.5 mg/dL    Glucose 161 (*) 70 - 99 mg/dL    Creatinine 0.96  0.45 - 1.30 mg/dL    BUN 17  7 - 22 mg/dL    Anion Gap 40.9  7.0 - 18.0 mMol/L    BUN/Creatinine Ratio 16.2  10.0 - 30.0 Ratio    EGFR >60      Osmolality Calc 277  275 - 300 mOsm/kg   CBC       Component Value Range    WBC 11.2 (*) 4.0 - 11.0 K/cmm    RBC 4.11  4.00 - 5.70 M/cmm    Hemoglobin 11.8 (*) 13.0 - 17.5 gm/dL    Hematocrit 81.1 (*) 39.0 - 52.5 %    MCV 86  80 - 100 fL    MCH 29  28 - 35 pg    MCHC 33  32 - 36 gm/dL    RDW 91.4  78.2 - 95.6 %    PLT CT 254  130 - 440 K/cmm    MPV 6.7  6.0 - 10.0 fL   VH DEXTROSE STICK GLUCOSE       Component Value Range    Glucose, POCT 189 (*) 70 - 99 mg/dL   VH DEXTROSE STICK GLUCOSE       Component Value Range    Glucose, POCT 278 (*) 70 - 99 mg/dL     Results for orders placed during the hospital encounter of 03/22/13   US ABDOMEN COMPLETE    Narrative:     Clinical History:  Right upper quadrant abdominal pain    Examination:  Abdominal ultrasound.    Comparison:  CT 05 September 2006    Findings:  No gallstones are identified. The gallbladder wall is not thickened, though the  gallbladder and common bile duct are  both distended. The common bile measures 9 mm in diameter. The patient is also tender over the gallbladder. No  obstructing lesion is seen, though the pancreatic head is not well imaged due to bowel gas. The intrahepatic bile ducts  may also be dilated. No focal liver lesions are seen. The spleen, kidneys, aorta, IVC and imaged portion of the pancreas  are normal in appearance.  Impression:     Distended biliary system, suggesting an obstruction in the region of the distal common bile duct: If indicated, this can  be further evaluated with MRI or CT.    ReadingStation:[id number]   CT ABDOMEN PELVIS W IV AND PO CONT    Narrative:     Clinical History:  biliary colic    Technique:  CT of abdomen and pelvis with intravenous contrast obtained. Multiplanar reconstructions performed.    Contrast:  100 cc Omnipaque 350    Comparison:  September 05, 2006    Findings:  Lung bases demonstrate  2. Stable benign nodules on the left, largest measuring 3 mm.    The liver is within normal limits. There is moderate distention of the gallbladder. 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 There are obstructing stones within the duct within the head and neck region with transition  point within the duct. Upstream gland atrophy, similar to prior examination. Rounded appearance to the uncinate process  similar to previous examination. No distinct mass identified. There is dilatation of the common bile duct measuring 11  mm in diameter. The duct gradually tapers within the pancreas. No calcified stone identified within the duct although  there are some stones in the adjacent pancreatic duct. There are some nonspecific infiltrative changes extending into  the porta hepatis. Trace possibility of acute pancreatitis superimposed upon chronic pancreatitis.    Spleen is normal in appearance. The adrenal glands are normal in appearance. Kidneys demonstrate a 1.5 cm lower pole  cyst on the left. Right  kidney is normal in appearance.    The abdominal aorta is normal in caliber with moderate atherosclerotic changes. The mesenteric vessels are patent.    Imaging of the pelvis demonstrates no free fluid or inflammatory changes.    The bowel loops are unremarkable in appearance. There is moderate gastric distention.      Impression:     Changes of chronic calcific pancreatitis. Questionable superimposed acute pancreatitis with infiltrative changes seen  extending into the porta hepatis. May also consider duodenitis and underlying peptic ulcer disease. Moderate distention  of the gallbladder. There is mild biliary tract dilatation which could be related to underlying pancreatitis. No  calcific stones appreciated within the common bile duct.        ReadingStation:[id number]   MRI ABDOMEN WO CONTRAST MRCP    Narrative:     Clinical History:  Abdominal pain, biliary colic, pancreatic duct stones seen on CT    Examination:  Multiplanar MR images of the abdomen were obtained without contrast. The MRCP protocol was used. Additional  3-dimensional reformatted images were provided.    Comparison:  CT 22 March 2013    Findings:  Multiple stones are present within the pancreatic duct. Many are clustered within the pancreatic head, apparently  obstructing the duct, which is dilated within the body and tail. There are also multiple parenchymal calcifications  within the pancreas, presumably all related to chronic pancreatitis. Mild stranding and fluid are present about the  pancreatic head and the adjacent duodenum. This is most likely related pancreatitis, with no convincing evidence of  duodenal involvement. There is no evidence of pseudocyst or abscess formation    No biliary stones are identified. The common bile duct is mildly distended, measuring 8 mm in diameter. It is slightly  narrowed within the pancreatic head. This is likely related to the pancreatitis, though a constricting lesion in the  pancreatic head cannot be  excluded with this unenhanced study.  The liver, spleen, adrenal glands, right kidney and imaged portion of the digestive tract are normal in appearance.  There is 1.5 cm cystic structure in the lower pole of the left kidney. Mild calcification is present with a normal  caliber aorta. No enlarged lymph nodes or masses are identified. Incompletely imaged degenerative changes are present in  the lumbar spine.    Mild airspace disease in the lung bases likely represents atelectasis.      Impression:     1.  Pancreatitis with multiple stones seen within the pancreatic duct, some of which appear to cause obstruction.  2.  Mild narrowing of the common bile duct within the pancreatic head, which could be related to pancreatitis, though a  constricting lesion cannot be excluded at this time.    ReadingStation:[id number]   FL FLUORO < 1 HOUR    Narrative:     Clinical History:  Cholangiogram    Examination:  FL FLUORO < 1 HOUR    Fluoroscopy Time: 29 seconds      Impression:     2 intraoperative fluoroscopic images are performed demonstrating status post cholecystectomy changes with out evidence  of filling defect seen within a prominent common bile duct. There is adequate biliary to bowel opacification of the  bowel.  4. More inflation please see operative report from the same day.    ReadingStation:[id number]       MDM    Patient will need admission for IV fluids, pain medicine, and MRCP.  The patient presents to the Emergency Department with abdominal pain. Treatment has been initiated in the ER, but the patient has not had significant improvement in symptoms, appears ill enough and/or has illness/findings/co-morbidities that make admission for IV medications, possible surgical consult, and further management the most appropriate disposition. Differential diagnosis has included but is not limited to appendicitis, gall bladder disease, bowel obstruction, colitis, gastroenteritis, urinary tract obstruction, AAA, pancreatitic  and hepatitis.  Diagnostic impression and plan were discussed and agreed upon with the patient and/or family.  Results of lab/radiology tests were reviewed and discussed with the patient and/or family. All questions were answered and concerns addressed.  Appropriate consultation was made for admission and further treatment of this patient.    < 30 min        Procedures    Clinical Impression & Disposition     Clinical Impression  Final diagnoses:   Biliary tract distention        ED Disposition     Admit Admitting Physician: Cala Bradford [[id number]]  Diagnosis: Biliary colic [440347]  Estimated Length of Stay: > or = to 2 midnights  Tentative Discharge Plan?: Home or Self Care [1]  Patient Class: Inpatient [101]  I certify that inpatient services are medically necessary for this patient. Please see H&P and MD progress notes for additional information about the patient's course of treatment. For Medicare patients, services provided in accordance with 412.3 and expected LOS to be greater than 2 midnights for Medicare patients.: Yes             Discharge Medication List as of 03/25/2013  3:07 PM      START taking these medications    Details   oxyCODONE-acetaminophen (PERCOCET) 5-325 MG per tablet Take 1-2 tablets by mouth every 4 (four) hours as needed., Starting 03/25/2013, Until Discontinued, Print                       Brooke Dare, Norwood Levo, MD  03/22/13 1628    Wyona Almas, MD  03/22/13 1639    Wyona Almas, MD  03/28/13 2248

## 2013-03-22 NOTE — H&P (Signed)
HISTORY AND PHYSICAL - [facility]    Date Time: 03/22/2013 6:19 PM  Patient Name: Henry Hines  Attending Physician: Sara Chu, MD  Primary Care Physician: Raleigh Callas, MD    CC:   Chief Complaint   Patient presents with   . Abdominal Pain       Assessment:                                                                                       [facility]   Principal Problem:   *Common bile duct dilatation  Active Problems:   Diabetes mellitus type 2 in nonobese   Colic, biliary   Biliary colic   AKI (acute kidney injury)      Plan:                                                                                                   [facility]   1.Common bile duct dilatation with possible any mass which is not visible on ultrasonography, with colic pain. The plan is to  Admit to medical floor  Pain control  Gastro consult called ( Dr Bobbye Charleston)  Surgery ( Dr Earleen Newport) called and recommend CT abdomen  Follow up CT of abdomen   Possible MRCP    2. Diabetes mellitus type 2 in non-obese   Hold oral medication   Insulin coverage for blood sugar monitoring     3.Colic, biliary   Continue pain control     4. AKI (acute kidney injury) secondary to dehydration   IV fluid hydration with N/s     5. Hypertension   Continue home medication    6.DVT prophylaxis on heparin sq      Service status: Inpatient: risk of progressive disease  Anticipate discharge in:2 days.  History of Presenting Illness:                                                          [facility]     Henry Hines is a 68 y.o. male with significant past medical history of diabetes and prostate cancer s/p surgery came with the complaints of right upper quadrant pain of three days duration. The pain is colicky in nature with radiation to the back and umbilical area, on scale of 6/10 associated with nausea and vomiting. The vomitus of ingested material and non projectile. He noticed worsening of the pain when he had pancakes  with eggs. He had past surgical history where there was a complication of ruptured appendicitis. He had no urinary urgency, dysuria nor  frequency. He has no cough nor any chest pain.  Past Medical History:                                                                        [facility]     Past Medical History   Diagnosis Date   . Diabetes mellitus        Past Surgical History:                                                                       [facility]     History reviewed. No pertinent past surgical history.    Family History:                                                                                    [facility]     History reviewed. No pertinent family history.    Social History:                                                                                    [facility]     History     Social History   . Marital Status: Married     Spouse Name: N/A     Number of Children: N/A   . Years of Education: N/A     Occupational History   . Not on file.     Social History Main Topics   . Smoking status: Former Research scientist (life sciences)   . Smokeless tobacco: Not on file   . Alcohol Use: No   . Drug Use:    . Sexually Active: Not on file     Other Topics Concern   . Not on file     Social History Narrative   . No narrative on file       Code Status: No Order  Allergies:  [facility]     No Known Allergies    Medications:                                                                                       [facility]     No current facility-administered medications on file prior to encounter.     No current outpatient prescriptions on file prior to encounter.       Review Of Systems:                                                                          [facility]     1. Constitutional:  Denies fever, weight loss or fatigue.    2. Eyes:  Denies blurry vision or double  vision.    3. ENT:  Denies sore throat or trouble swallowing.    4. CV:  Denies chest pain or palpitations.    5. Respiratory:  Denies SOB or cough.    6. GI:  See HPI.    7. GU: Denies pain or burning with urination.    8. MS: Denies new muscle or joint pain.    9. Skin: Denies rash.    10. Neuro: Denies any new areas of numbness or weakness.    11. Psych: Denies being depressed or anxious.    12. Heme: Denies any bleeding or recent anemia.    13. Endocrine: Denies heat or cold intolerance. Denies excessive thirst or urination.    14. Immune:  Denies seasonal allergies or asthma exacerbation.    Physical Exam:                                                                                   [facility]     Patient Vitals for the past 24 hrs:   BP Temp Pulse Resp SpO2 Weight   03/22/13 1612 152/70 mmHg - 70  18  - -   03/22/13 1259 143/79 mmHg 98.4 F (36.9 C) - - - 82 kg (180 lb 12.4 oz)   03/22/13 1257 - - 107  18  100 % 83 kg (182 lb 15.7 oz)     There is no height on file to calculate BMI.  No intake or output data in the 24 hours ending 03/22/13 1819    General: awake, alert, oriented x 3; in mild pain distress.  HEENT: perrla, eomi, sclera anicteric  oropharynx clear without lesions, mucous membranes moist  Neck: supple, no lymphadenopathy, no thyromegaly, no JVD, no carotid bruits  Cardiovascular: regular rate and rhythm, no murmurs, rubs  or gallops  Lungs: clear to auscultation bilaterally, without wheezing, rhonchi, or rales  Abdomen: right upper quadrant tenderness with ? Murphy's sign, but  soft, non-distended; no palpable masses,  normoactive bowel sounds, no rebound or guarding  Extremities: no clubbing, cyanosis, or edema  Neuro: cranial nerves grossly intact, strength 5/5 in upper and lower extremities, sensation intact,   Skin: no rashes or lesions noted      Labs and Imaging:                                                                              [facility]     Results      Procedure Component Value Units Date/Time    Dextrose Stick Glucose [id number]  (Abnormal) Collected:03/22/13 1647    Specimen Information:Blood Updated:03/22/13 1704     Glucose, POCT 177 (H) mg/dL     Basic Metabolic Panel [id number]  (Abnormal) Collected:03/22/13 1407    Specimen Information:Blood / Plasma Updated:03/22/13 1434     Sodium 137 mMol/L      Potassium 4.3 mMol/L      Chloride 103 mMol/L      CO2 20.0 mMol/L      CALCIUM 10.0 mg/dL      Glucose 176 (H) mg/dL      Creatinine 1.34 (H) mg/dL      BUN 18 mg/dL      Anion Gap 18.3 (H) mMol/L      BUN/Creatinine Ratio 13.4 Ratio      EGFR 53 mL/min/1.4m2      Osmolality Calc 280 mOsm/kg     Lipase [id number] Collected:03/22/13 1407    Specimen Information:Blood / Plasma Updated:03/22/13 1434     Lipase 36 U/L     LFTs [id number]  (Abnormal) Collected:03/22/13 1407    Specimen Information:Blood / Plasma Updated:03/22/13 1434     Protein, Total 7.7 gm/dL      Albumin 4.2 gm/dL      Alkaline Phosphatase 78 U/L      ALT 14 U/L      AST (SGOT) 16 U/L      Bilirubin, Total 0.9 mg/dL      Bilirubin, Direct 0.4 (H) mg/dL      Albumin/Globulin Ratio 1.20 Ratio      Globulin 3.5 gm/dL     CBC and differential [id number]  (Abnormal) Collected:03/22/13 1407    Specimen Information:Blood / Blood Updated:03/22/13 1418     WBC 12.4 (H) K/cmm      RBC 4.84 M/cmm      Hemoglobin 14.2 gm/dL      Hematocrit 41.0 %      MCV 85 fL      MCH 29 pg      MCHC 35 gm/dL      RDW 12.0 %      PLT CT 304 K/cmm      MPV 6.4 fL      NEUTROPHIL % 85.1 (H) %      Lymphocytes 8.7 (L) %      Monocytes 5.7 %      Eosinophils % 0.3 %      Basophils % 0.2 %      Neutrophils Absolute 10.5 (H) K/cmm  Lymphocytes Absolute 1.1 K/cmm      Monocytes Absolute 0.7 K/cmm      Eosinophils Absolute 0.0 K/cmm      BASO Absolute 0.0 K/cmm           US ABDOMEN COMPLETE    Final Result: Distended biliary system, suggesting an obstruction in the region of the distal common bile duct: If indicated,  this can     be further evaluated with MRI or CT.        ReadingStation:[id number]         Signed by: Jarrett Soho, MD    [facility]  [address]  [place], [place]  Pager [phone]  [id number], Abundio Miu, MD

## 2013-03-22 NOTE — ED Notes (Signed)
Pt to be admitted to hospital by [facility]

## 2013-03-22 NOTE — ED Notes (Signed)
3 days of RUQ pain, sharp pain and burning, n/v this morning

## 2013-03-22 NOTE — Progress Notes (Signed)
Report called to nurse on GI. Pt transported to room 519 in his bed.

## 2013-03-22 NOTE — Consults (Signed)
GASTROENTEROLOGY CONSULTATION  [facility]    Date Time: 03/22/2013 6:55 PM  Patient Name: Henry Hines  Requesting Physician: Wyona Almas, MD      Reason for Consultation:   We were asked to see this patient for abdominal pain/abnormal [place].     Assessment:     Patient Active Problem List   Diagnosis   . Diabetes mellitus type 2 in nonobese   . Common bile duct dilatation   . Colic, biliary   . Biliary colic   . AKI (acute kidney injury)       RUQ pain: dilated bilairy tree - Ct scan pending  Plan:   follow up on radiology    History:   [name] is a 68 y.o. male who presents to the hospital on 03/22/2013 with RUQ abdominal pain. Pt noted he was not feeling well on Saturday. By Sunday it was worse. He felt pain that started in RUQ but radiated to back and every where. He did not feel like eating. Bms were normal until then. Never had this before. No F/C. VOmited foamy stuff once. Did not feel like his reflux symptoms. No dysphagia. Noted his sugars increased despite not eating.     Past Medical History:     Past Medical History   Diagnosis Date   . Diabetes mellitus        Endoscopic History   None in provation    Past Surgical History:   History reviewed. No pertinent past surgical history.    Family History:   History reviewed. No pertinent family history.    Social History:     History     Social History   . Marital Status: Married     Spouse Name: N/A     Number of Children: N/A   . Years of Education: N/A     Social History Main Topics   . Smoking status: Former Games developer   . Smokeless tobacco: Not on file   . Alcohol Use: No   . Drug Use:    . Sexually Active: Not on file     Other Topics Concern   . Not on file     Social History Narrative   . No narrative on file       Allergies:   No Known Allergies    Medications:     Current Facility-Administered Medications   Medication Dose Route Frequency   . heparin (porcine)  5,000 Units Subcutaneous Q8H SCH   . [COMPLETED]  HYDROmorphone  0.5 mg Intravenous Once in ED   . [START ON 03/23/2013] insulin aspart  1-4 Units Subcutaneous TID AC   . [COMPLETED] iohexol  50 mL Oral Once   . [COMPLETED] ketorolac  15 mg Intravenous Once in ED   . [START ON 03/23/2013] lisinopril  2.5 mg Oral Daily   . [COMPLETED] morphine  4 mg Intravenous Once in ED   . [COMPLETED] morphine  4 mg Intravenous Once in ED   . [COMPLETED] ondansetron  4 mg Intravenous Once in ED   . [START ON 03/23/2013] pantoprazole  40 mg Oral QAM AC   . pravastatin  40 mg Oral QHS   . sodium chloride (PF)  3 mL Intravenous Q8H          . sodium chloride         Review of Systems:   A comprehensive review of systems was negative except for: back pain from laying in hospital bed  Physical Exam:     Filed Vitals:    03/22/13 1826   BP: 163/78   Pulse: 70   Temp: 97.7 F (36.5 C)   Resp: 18   SpO2: 100%     General appearance - alert, well appearing, and in no distress  Mental status - alert, oriented to person, place, and time  Eyes - pupils equal and reactive, extraocular eye movements intact  Mouth - mucous membranes moist, pharynx normal without lesions  Neck - supple, no significant adenopathy  Lymphatics - no palpable lymphadenopathy, no hepatosplenomegaly  Chest - clear to auscultation, no wheezes, rales or rhonchi, symmetric air entry  Heart - normal rate, regular rhythm, normal S1, S2, no murmurs, rubs, clicks or gallops  Abdomen - soft, nontender, nondistended, no masses or organomegaly. No pain with palpation which surprised him but he did get some pain med within an hour.   Extremities - peripheral pulses normal, no pedal edema, no clubbing or cyanosis  Skin - normal coloration and turgor, no rashes, no suspicious skin lesions noted    Lab:     Results     Procedure Component Value Units Date/Time    Dextrose Stick Glucose [[id number]]  (Abnormal) Collected:03/22/13 1647    Specimen Information:Blood Updated:03/22/13 1704     Glucose, POCT 177 (H) mg/dL     Basic  Metabolic Panel [[id number]]  (Abnormal) Collected:03/22/13 1407    Specimen Information:Blood / Plasma Updated:03/22/13 1434     Sodium 137 mMol/L      Potassium 4.3 mMol/L      Chloride 103 mMol/L      CO2 20.0 mMol/L      CALCIUM 10.0 mg/dL      Glucose 865 (H) mg/dL      Creatinine 7.84 (H) mg/dL      BUN 18 mg/dL      Anion Gap 69.6 (H) mMol/L      BUN/Creatinine Ratio 13.4 Ratio      EGFR 53 mL/min/1.48m2      Osmolality Calc 280 mOsm/kg     Lipase [[id number]] Collected:03/22/13 1407    Specimen Information:Blood / Plasma Updated:03/22/13 1434     Lipase 36 U/L     LFTs [[id number]]  (Abnormal) Collected:03/22/13 1407    Specimen Information:Blood / Plasma Updated:03/22/13 1434     Protein, Total 7.7 gm/dL      Albumin 4.2 gm/dL      Alkaline Phosphatase 78 U/L      ALT 14 U/L      AST (SGOT) 16 U/L      Bilirubin, Total 0.9 mg/dL      Bilirubin, Direct 0.4 (H) mg/dL      Albumin/Globulin Ratio 1.20 Ratio      Globulin 3.5 gm/dL     CBC and differential [[id number]]  (Abnormal) Collected:03/22/13 1407    Specimen Information:Blood / Blood Updated:03/22/13 1418     WBC 12.4 (H) K/cmm      RBC 4.84 M/cmm      Hemoglobin 14.2 gm/dL      Hematocrit 42.5 %      MCV 85 fL      MCH 29 pg      MCHC 35 gm/dL      RDW 95.6 %      PLT CT 304 K/cmm      MPV 6.4 fL      NEUTROPHIL % 85.1 (H) %      Lymphocytes 8.7 (L) %      Monocytes 5.7 %  Eosinophils % 0.3 %      Basophils % 0.2 %      Neutrophils Absolute 10.5 (H) K/cmm      Lymphocytes Absolute 1.1 K/cmm      Monocytes Absolute 0.7 K/cmm      Eosinophils Absolute 0.0 K/cmm      BASO Absolute 0.0 K/cmm         Labs Reviewed.     Radiology:     Radiology Results (24 Hour)     Procedure Component Value Units Date/Time    US Abdomen Complete [[id number]] Collected:03/22/13 1616    Order Status:Completed  Updated:03/22/13 1621    Narrative:    Clinical History:  Right upper quadrant abdominal pain    Examination:  Abdominal ultrasound.    Comparison:  CT 05 September 2006    Findings:  No gallstones are identified. The gallbladder wall is not thickened, though the gallbladder and common bile duct are  both distended. The common bile measures 9 mm in diameter. The patient is also tender over the gallbladder. No  obstructing lesion is seen, though the pancreatic head is not well imaged due to bowel gas. The intrahepatic bile ducts  may also be dilated. No focal liver lesions are seen. The spleen, kidneys, aorta, IVC and imaged portion of the pancreas  are normal in appearance.      Impression:    Distended biliary system, suggesting an obstruction in the region of the distal common bile duct: If indicated, this can  be further evaluated with MRI or CT.    ReadingStation:[id number]        Radiological Procedure reviewed.     Signed by: Jayme Cloud, MD

## 2013-03-23 ENCOUNTER — Inpatient Hospital Stay: Payer: Medicare Other

## 2013-03-23 ENCOUNTER — Encounter (HOSPITAL_BASED_OUTPATIENT_CLINIC_OR_DEPARTMENT_OTHER): Payer: Self-pay

## 2013-03-23 ENCOUNTER — Encounter
Admission: EM | Disposition: A | Discharge status: Home / Self Care | Payer: Self-pay | Source: Home / Self Care | Attending: Internal Medicine

## 2013-03-23 HISTORY — PX: EGD: SHX3789

## 2013-03-23 LAB — BASIC METABOLIC PANEL
Anion Gap: 14.5 mMol/L (ref 7.0–18.0)
BUN / Creatinine Ratio: 16.1 Ratio (ref 10.0–30.0)
BUN: 18 mg/dL (ref 7–22)
CO2: 20.3 mMol/L (ref 20.0–30.0)
Calcium: 9 mg/dL (ref 8.5–10.5)
Chloride: 103 mMol/L (ref 98–110)
Creatinine: 1.12 mg/dL (ref 0.80–1.30)
EGFR: >60 mL/min/{1.73_m2}
Glucose: 187 mg/dL — ABNORMAL HIGH (ref 70–99)
Osmolality Calc: 273 mOsm/kg — ABNORMAL LOW (ref 275–300)
Potassium: 4.8 mMol/L (ref 3.5–5.3)
Sodium: 133 mMol/L — ABNORMAL LOW (ref 136–147)

## 2013-03-23 LAB — VH DEXTROSE STICK GLUCOSE
Glucose POCT: 187 mg/dL — ABNORMAL HIGH (ref 70–99)
Glucose POCT: 147 mg/dL — ABNORMAL HIGH (ref 70–99)
Glucose POCT: 133 mg/dL — ABNORMAL HIGH (ref 70–99)
Glucose POCT: 111 mg/dL — ABNORMAL HIGH (ref 70–99)

## 2013-03-23 LAB — CBC
Hematocrit: 40 % (ref 39.0–52.5)
Hemoglobin: 13.9 gm/dL (ref 13.0–17.5)
MCH: 30 pg (ref 28–35)
MCHC: 35 gm/dL (ref 32–36)
MCV: 85 fL (ref 80–100)
MPV: 6.4 fL (ref 6.0–10.0)
PLT CT: 291 10*3/uL (ref 130–440)
RBC: 4.69 10*6/uL (ref 4.00–5.70)
RDW: 12 % (ref 11.0–14.0)
WBC: 11.6 10*3/uL — ABNORMAL HIGH (ref 4.0–11.0)

## 2013-03-23 LAB — LIPASE: Lipase: 62 U/L (ref 8–78)

## 2013-03-23 LAB — CEA: CEA-Abbott: 1.91 ng/mL (ref 0.00–3.00)

## 2013-03-23 SURGERY — DONT USE, USE 1095-ESOPHAGOGASTRODUODENOSCOPY (EGD), DIAGNOSTIC
Anesthesia: Conscious Sedation | Site: Abdomen | Wound class: Clean Contaminated

## 2013-03-23 MED ORDER — BENZOCAINE 20 % MT SOLN
OROMUCOSAL | Status: AC
Start: 2013-03-23 — End: ?
  Filled 2013-03-23: qty 0.28

## 2013-03-23 MED ORDER — ONDANSETRON HCL 4 MG/2ML IJ SOLN
4.0000 mg | INTRAMUSCULAR | Status: DC | PRN
Start: 2013-03-23 — End: 2013-03-23

## 2013-03-23 MED ORDER — MEPERIDINE HCL 50 MG/ML IJ SOLN
INTRAMUSCULAR | Status: AC
Start: 2013-03-23 — End: ?
  Filled 2013-03-23: qty 2

## 2013-03-23 MED ORDER — ONDANSETRON 4 MG PO TBDP
4.0000 mg | ORAL_TABLET | ORAL | Status: DC | PRN
Start: 2013-03-23 — End: 2013-03-23

## 2013-03-23 MED ORDER — ONDANSETRON 4 MG PO TBDP
4.0000 mg | ORAL_TABLET | ORAL | Status: DC | PRN
Start: 2013-03-23 — End: 2013-03-25

## 2013-03-23 MED ORDER — ONDANSETRON HCL 4 MG/2ML IJ SOLN
4.0000 mg | INTRAMUSCULAR | Status: AC | PRN
Start: 2013-03-23 — End: 2013-03-23
  Administered 2013-03-23: 4 mg via INTRAVENOUS
  Filled 2013-03-23: qty 2

## 2013-03-23 MED ORDER — MIDAZOLAM HCL 5 MG/5ML IJ SOLN
INTRAMUSCULAR | Status: AC
Start: 2013-03-23 — End: ?
  Filled 2013-03-23: qty 5

## 2013-03-23 MED ORDER — VH MIDAZOLAM HCL 5 MG/5 ML (NARRATOR)
INTRAMUSCULAR | Status: DC | PRN
Start: 2013-03-23 — End: 2013-03-23
  Administered 2013-03-23 (×2): 2 mg via INTRAVENOUS
  Administered 2013-03-23: 1 mg via INTRAVENOUS

## 2013-03-23 MED ORDER — MEPERIDINE HCL 50 MG/ML IJ SOLN
INTRAMUSCULAR | Status: DC | PRN
Start: 2013-03-23 — End: 2013-03-23
  Administered 2013-03-23 (×2): 50 mg via INTRAVENOUS

## 2013-03-23 SURGICAL SUPPLY — 9 items
CRE BALLOON 10-12 DILATOR 5835 (Supply) IMPLANT
CRE BALLOON 12-15 DILATOR 5836 (TDC (Tubes, Draines, Catheters))
CRE BALLOON 12-15 DILATOR 5836 (TDC (Tubes, Drains, Catheters)) IMPLANT
CRE BALLOON 15-18 DILATOR 5837 (Supply) IMPLANT
CRE BALLOON 18-20 DILATOR 5838 (Supply) IMPLANT
CRE BALLOON 8-10 DILAT 5834 (Supply) IMPLANT
FORCEP BIOPSY RAD JAW 1333-40 (Supply) IMPLANT
MECHANISM HAND ALLIANCE (Supply) IMPLANT
SYSTEM INFLATION ALLIANCE II (Supply) IMPLANT

## 2013-03-23 NOTE — Brief Op Note (Addendum)
EGD  abnl ct  FIndings:    Nl exam      Please see Provation Note for more details.       Plan: Return patient to hospital ward for ongoing care.   The CT scan showed stones in pancreatic duct. He is mild pain - will try to set up an outpt apt with [facility] for stones in pancreatic duct.     Follow up with PCP   order MRCP for panc stones

## 2013-03-23 NOTE — Progress Notes (Signed)
[facility]  [address]  [place] [address]    INITIAL ASSESSMENT  Case Management / Social Work      Estimated D/C Date:03/25/13      PCP/Last visit: Dr. Richardson Landry last visit December, 2014 (sees him every three months)      Home assessment/PLOF: Lives with spouse. Independent in functioning.      Financials and Insurance:  Has Medicare Part A and B and Anthem; has script coverage through [place]; has questions about how this would work with Medicare Part D.       Community Services: None.       DME's/Supplier: None.      Inpatient Plan of Care: 1.Common bile duct dilatation with possible any mass which is not visible on ultrasonography, with colic pain. The plan is to   Admit to medical floor   Pain control   Gastro consult called ( Dr Kizzie Ide)   Surgery ( Dr Loreta Ave) called and recommend CT abdomen   Follow up CT of abdomen   Possible MRCP   2. Diabetes mellitus type 2 in non-obese   Hold oral medication   Insulin coverage for blood sugar monitoring   3.Colic, biliary   Continue pain control   4. AKI (acute kidney injury) secondary to dehydration   IV fluid hydration with N/s   5. Hypertension   Continue home medication   6.DVT prophylaxis on heparin sq        CM Interventions: SW reviewed chart; met with pt and spouse. Pt plans to return to home with spouse. Pt had questions re: Anthem script coverage and Medicare Part D. SW not familiar or knowledgeable with this issue. Pt and spouse agreed they had someone they could ask that might know. No other needs identified.       Transportation: Spouse      Barriers to discharge:  None.      D/C Plan and Needs:  Pt to discharge home when ready. No discharge needs identified. Please reconsult if needed.         Virl Diamond, MSSW  [phone]

## 2013-03-23 NOTE — Progress Notes (Signed)
PROGRESS NOTE - [facility]    Date Time: 03/23/2013 7:26 AM  Patient Name: Henry Hines  Attending Physician: Cala Bradford, MD  Today's date: 03/23/2013  Length of Stay: 1  Assessment:                                                                                       [facility] Hospitalists   Principal Problem:   *Common bile duct dilatation  Active Problems:   Diabetes mellitus type 2 in nonobese   Colic, biliary   Biliary colic   AKI (acute kidney injury)        Plan:                                                                                                    [facility] Hospitalists   1.Common bile duct dilatation with possible any mass which is not visible on ultrasonography, with colic pain. The plan is to   Pain control   Gastro input appreciated ( Dr Kizzie Ide)   Surgery ( Dr Loreta Ave) called and recommend CT abdomen    Possible EGD and MRCP today    2. Diabetes mellitus type 2 in non-obese   Hold oral medication   Insulin coverage for blood sugar monitoring   3.Colic, biliary    Continue pain control    Lipase and LFT are within normal limit    4. AKI (acute kidney injury) secondary to dehydration trending down   Continue IV fluid hydration with N/s   5. Hypertension    Continue home medication   6.DVT prophylaxis on heparin sq    Lines:                                                                                                  [facility]     Active [employer] / CVC Line / PIV Line / Drain / Airway / Intraosseous Line / Epidural Line / ART Line / Line / Wound / Pressure Ulcer / NG/OG Tube     Name   Placement date   Placement time   Site   Days    Peripheral IV 03/22/13 Right Antecubital  03/22/13   1402   Antecubital   less than 1          Subjective  [facility] Hospitalists     CC: Common bile duct dilatation    He had occasional abdominal pain but no vomiting. He is waiting for EGd today.      Review  of Systems:                                                                           [facility] Hospitalists     Constitutional:  Denies fever or fatigue.    ENT:  Denies sore throat or trouble swallowing.    CV:  Denies chest pain or palpitations.    Respiratory:  Denies SOB or cough.    GI:  Claims to have or abdominal pain but denies nausea, vomiting, diarrhea, constipation .    GU:  Denies pain or burning with urination.    MS:  Denies new muscle or joint pain.    Skin:  Denies rash.    Heme: Denies any bleeding.    Physical Exam:                                                                                   [facility] Hospitalists   Temp:  [97.7 F (36.5 C)-98.6 F (37 C)] 98.5 F (36.9 C)  Heart Rate:  [67-107] 70   Resp Rate:  [16-18] 17   BP: (122-163)/(70-82) 122/71 mmHg  No intake or output data in the 24 hours ending 03/23/13 0726  General: awake, alert, oriented x 3; in mild pain distress.   HEENT: perrla, eomi, sclera anicteric oropharynx clear without lesions, mucous membranes moist   Neck: supple, no lymphadenopathy, no thyromegaly, no JVD, no carotid bruits   Cardiovascular: regular rate and rhythm, no murmurs, rubs or gallops   Lungs: clear to auscultation bilaterally, without wheezing, rhonchi, or rales   Abdomen: right upper quadrant tenderness with ? Murphy's sign, but soft, non-distended; no palpable masses, normoactive bowel sounds, no rebound or guarding   Extremities: no clubbing, cyanosis, or edema   Neuro: cranial nerves grossly intact, strength 5/5 in upper and lower extremities, sensation intact,   Skin: no rashes or lesions noted  Meds:                                                                                                  [facility]     Estimated Creatinine Clearance: 68.3 ml/min (based on Cr of 1.12).    Current Facility-Administered Medications   Medication Dose Route Frequency   . heparin (porcine)  5,000 Units Subcutaneous Q8H SCH   . [  COMPLETED] HYDROmorphone  0.5  mg Intravenous Once in ED   . insulin aspart  1-4 Units Subcutaneous TID AC   . [COMPLETED] iohexol  50 mL Oral Once   . [COMPLETED] ketorolac  15 mg Intravenous Once in ED   . lisinopril  2.5 mg Oral Daily   . [COMPLETED] morphine  4 mg Intravenous Once in ED   . [COMPLETED] morphine  4 mg Intravenous Once in ED   . [COMPLETED] ondansetron  4 mg Intravenous Once in ED   . pantoprazole  40 mg Oral QAM AC   . pravastatin  40 mg Oral QHS   . sodium chloride (PF)  3 mL Intravenous Q8H       PRN medications: acetaminophen, dextrose, dextrose, glucagon (rDNA), HYDROmorphone, [COMPLETED] iohexol, naloxone, ondansetron    IV Drips:         . sodium chloride 150 mL/hr at 03/23/13 0347       Labs and Imaging:                                                                             [facility]     Results     Procedure Component Value Units Date/Time    Dextrose Stick Glucose [[id number]]  (Abnormal) Collected:03/23/13 0706    Specimen Information:Blood Updated:03/23/13 0723     Glucose, POCT 187 (H) mg/dL     Basic Metabolic Panel [[id number]]  (Abnormal) Collected:03/23/13 0559    Specimen Information:Blood / Plasma Updated:03/23/13 0706     Sodium 133 (L) mMol/L      Potassium 4.8 mMol/L      Chloride 103 mMol/L      CO2 20.3 mMol/L      CALCIUM 9.0 mg/dL      Glucose 308 (H) mg/dL      Creatinine 6.57 mg/dL      BUN 18 mg/dL      Anion Gap 84.6 mMol/L      BUN/Creatinine Ratio 16.1 Ratio      EGFR >60 mL/min/1.89m2      Osmolality Calc 273 (L) mOsm/kg     CBC [[id number]]  (Abnormal) Collected:03/23/13 0559    Specimen Information:Blood / Blood Updated:03/23/13 0636     WBC 11.6 (H) K/cmm      RBC 4.69 M/cmm      Hemoglobin 13.9 gm/dL      Hematocrit 32.4 %      MCV 85 fL      MCH 30 pg      MCHC 35 gm/dL      RDW 40.1 %      PLT CT 291 K/cmm      MPV 6.4 fL     Dextrose Stick Glucose [[id number]]  (Abnormal) Collected:03/22/13 2057    Specimen Information:Blood Updated:03/22/13 2124     Glucose, POCT 159 (H) mg/dL      Dextrose Stick Glucose [[id number]]  (Abnormal) Collected:03/22/13 1647    Specimen Information:Blood Updated:03/22/13 1704     Glucose, POCT 177 (H) mg/dL     Basic Metabolic Panel [[id number]]  (Abnormal) Collected:03/22/13 1407    Specimen Information:Blood / Plasma Updated:03/22/13 1434     Sodium 137 mMol/L      Potassium 4.3 mMol/L  Chloride 103 mMol/L      CO2 20.0 mMol/L      CALCIUM 10.0 mg/dL      Glucose 540 (H) mg/dL      Creatinine 9.81 (H) mg/dL      BUN 18 mg/dL      Anion Gap 19.1 (H) mMol/L      BUN/Creatinine Ratio 13.4 Ratio      EGFR 53 mL/min/1.63m2      Osmolality Calc 280 mOsm/kg     Lipase [[id number]] Collected:03/22/13 1407    Specimen Information:Blood / Plasma Updated:03/22/13 1434     Lipase 36 U/L     LFTs [[id number]]  (Abnormal) Collected:03/22/13 1407    Specimen Information:Blood / Plasma Updated:03/22/13 1434     Protein, Total 7.7 gm/dL      Albumin 4.2 gm/dL      Alkaline Phosphatase 78 U/L      ALT 14 U/L      AST (SGOT) 16 U/L      Bilirubin, Total 0.9 mg/dL      Bilirubin, Direct 0.4 (H) mg/dL      Albumin/Globulin Ratio 1.20 Ratio      Globulin 3.5 gm/dL     CBC and differential [[id number]]  (Abnormal) Collected:03/22/13 1407    Specimen Information:Blood / Blood Updated:03/22/13 1418     WBC 12.4 (H) K/cmm      RBC 4.84 M/cmm      Hemoglobin 14.2 gm/dL      Hematocrit 32.4 %      MCV 85 fL      MCH 29 pg      MCHC 35 gm/dL      RDW 40.1 %      PLT CT 304 K/cmm      MPV 6.4 fL      NEUTROPHIL % 85.1 (H) %      Lymphocytes 8.7 (L) %      Monocytes 5.7 %      Eosinophils % 0.3 %      Basophils % 0.2 %      Neutrophils Absolute 10.5 (H) K/cmm      Lymphocytes Absolute 1.1 K/cmm      Monocytes Absolute 0.7 K/cmm      Eosinophils Absolute 0.0 K/cmm      BASO Absolute 0.0 K/cmm           Microbiology, reviewed and are significant for:  Microbiology Results     None          Imaging, reviewed and are significant for:  Radiology Results (24 Hour)     Procedure Component Value  Units Date/Time    CT ABDOMEN PELVIS W IV AND PO CONT [[id number]] Collected:03/22/13 1953    Order Status:Completed  Updated:03/22/13 2007    Narrative:    Clinical History:  biliary colic    Technique:  CT of abdomen and pelvis with intravenous contrast obtained. Multiplanar reconstructions performed.    Contrast:  100 cc Omnipaque 350    Comparison:  September 05, 2006    Findings:  Lung bases demonstrate  2. Stable benign nodules on the left, largest measuring 3 mm.    The liver is within normal limits. There is moderate distention of the gallbladder. 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 There are obstructing stones within the duct within the head and neck region with transition  point within the duct. Upstream gland atrophy, similar to prior examination. Rounded appearance to the uncinate process  similar to previous examination. No  distinct mass identified. There is dilatation of the common bile duct measuring 11  mm in diameter. The duct gradually tapers within the pancreas. No calcified stone identified within the duct although  there are some stones in the adjacent pancreatic duct. There are some nonspecific infiltrative changes extending into  the porta hepatis. Trace possibility of acute pancreatitis superimposed upon chronic pancreatitis.    Spleen is normal in appearance. The adrenal glands are normal in appearance. Kidneys demonstrate a 1.5 cm lower pole  cyst on the left. Right kidney is normal in appearance.    The abdominal aorta is normal in caliber with moderate atherosclerotic changes. The mesenteric vessels are patent.    Imaging of the pelvis demonstrates no free fluid or inflammatory changes.    The bowel loops are unremarkable in appearance. There is moderate gastric distention.      Impression:    Changes of chronic calcific pancreatitis. Questionable superimposed acute pancreatitis with infiltrative changes seen  extending into the porta hepatis. May also consider duodenitis and  underlying peptic ulcer disease. Moderate distention  of the gallbladder. There is mild biliary tract dilatation which could be related to underlying pancreatitis. No  calcific stones appreciated within the common bile duct.        ReadingStation:[id number]    US Abdomen Complete [[id number]] Collected:03/22/13 1616    Order Status:Completed  Updated:03/22/13 1621    Narrative:    Clinical History:  Right upper quadrant abdominal pain    Examination:  Abdominal ultrasound.    Comparison:  CT 05 September 2006    Findings:  No gallstones are identified. The gallbladder wall is not thickened, though the gallbladder and common bile duct are  both distended. The common bile measures 9 mm in diameter. The patient is also tender over the gallbladder. No  obstructing lesion is seen, though the pancreatic head is not well imaged due to bowel gas. The intrahepatic bile ducts  may also be dilated. No focal liver lesions are seen. The spleen, kidneys, aorta, IVC and imaged portion of the pancreas  are normal in appearance.      Impression:    Distended biliary system, suggesting an obstruction in the region of the distal common bile duct: If indicated, this can  be further evaluated with MRI or CT.    ReadingStation:[email or web address]            Signed by: Cala Bradford, MD    [facility]  [address]  [place], [place] [address]  Pager [phone]

## 2013-03-23 NOTE — UM Notes (Signed)
[facility] Utilization Management Review Sheet    NAME: Henry Hines  MR#: [id number]    CSN#: [id number]    ROOM: 516/516-A AGE: 68 y.o.    ADMIT DATE AND TIME: 03/22/2013  1:30 PM      PATIENT CLASS:    ATTENDING PHYSICIAN: Hadgu, Inis Sizer, MD  PAYOR:Payor: MEDICARE  Plan: MEDICARE PART A AND B  Product Type: *No Product type*        AUTH #:     DIAGNOSIS: Principal Problem:   *Common bile duct dilatation  Active Problems:   Diabetes mellitus type 2 in nonobese   Colic, biliary   Biliary colic   AKI (acute kidney injury)          HISTORY:   Past Medical History   Diagnosis Date   . Diabetes mellitus        DATE OF REVIEW: 03/23/2013    VITALS: BP 132/68  Pulse 92  Temp 98.2 F (36.8 C) (Oral)  Resp 15  Ht 1.829 m (6')  Wt 79.924 kg (176 lb 3.2 oz)  BMI 23.89 kg/m2  SpO2 96%    ED Treatment   Morphine 4mg  x2, zofranIV, dilaudid iv, toradol IV  Admission Review  History     VS  143/79, 98.4, 107, 18, 100%  Labs  Wbc 12.4, neutrophil 85.1  Imagining ct of abd  Changes of chronic calcific pancreatitis. Questionable superimposed acute pancreatitis with infiltrative changes seen  extending into the porta hepatis. May also consider duodenitis and underlying peptic ulcer disease. Moderate distention  of the gallbladder. There is mild biliary tract dilatation which could be related to underlying pancreatitis. No  calcific stones appreciated within the common bile duct.    [place] abd  Distended biliary system, suggesting an obstruction in the region of the distal common bile duct:           Assessment/Plan  Plan: [facility]    1.Common bile duct dilatation with possible any mass which is not visible on ultrasonography, with colic pain. The plan is to   Admit to medical floor   Pain control   Gastro consult called ( Dr Kizzie Ide)   Surgery ( Dr Loreta Ave) called and recommend CT abdomen   Follow up CT of abdomen   Possible MRCP   2. Diabetes mellitus type 2 in non-obese   Hold oral medication   Insulin coverage for blood  sugar monitoring   3.Colic, biliary   Continue pain control   4. AKI (acute kidney injury) secondary to dehydration   IV fluid hydration with N/s   5. Hypertension   Continue home medication   6.DVT prophylaxis on heparin sq    Admission Orders  IV zofran x2, dilaudid IV x3,IVF 100/hr

## 2013-03-23 NOTE — Progress Notes (Signed)
Assumed care at 1900. No s/s of distress. Pt reports pain 9/10 and nausea, medicated for both. Pt does not want to take evening meds at this time. Will recheck to see if patient would like to take them when nausea subsides. Assessment complete. Pt denies any needs at this time. Call bell in reach, bed alarm set for patient safety. Will continue to monitor.

## 2013-03-23 NOTE — Progress Notes (Signed)
Pt stated his pain and nausea was much more tolerable after taking the medications. Pt took nighttime medications and tolerated them well. Will continue to monitor.

## 2013-03-23 NOTE — Progress Notes (Signed)
Spoke to Dr. Loreta Ave about subq heparin, md stated to give heparin even with possible surgery tomorrow.

## 2013-03-24 ENCOUNTER — Encounter
Admission: EM | Disposition: A | Discharge status: Home / Self Care | Payer: Self-pay | Source: Home / Self Care | Attending: Internal Medicine

## 2013-03-24 ENCOUNTER — Inpatient Hospital Stay: Payer: Medicare Other | Admitting: Certified Registered"

## 2013-03-24 ENCOUNTER — Encounter: Payer: Self-pay | Admitting: Certified Registered"

## 2013-03-24 ENCOUNTER — Inpatient Hospital Stay: Payer: Medicare Other

## 2013-03-24 DIAGNOSIS — K8010 Calculus of gallbladder with chronic cholecystitis without obstruction: Secondary | ICD-10-CM

## 2013-03-24 DIAGNOSIS — K838 Other specified diseases of biliary tract: Secondary | ICD-10-CM

## 2013-03-24 HISTORY — PX: LAPAROSCOPIC, CHOLECYSTECTOMY, CHOLANGIOGRAM: SHX4475

## 2013-03-24 LAB — CBC
Hematocrit: 39.3 % (ref 39.0–52.5)
Hemoglobin: 13.8 gm/dL (ref 13.0–17.5)
MCH: 30 pg (ref 28–35)
MCHC: 35 gm/dL (ref 32–36)
MCV: 85 fL (ref 80–100)
MPV: 6.5 fL (ref 6.0–10.0)
PLT CT: 280 10*3/uL (ref 130–440)
RBC: 4.62 10*6/uL (ref 4.00–5.70)
RDW: 12.1 % (ref 11.0–14.0)
WBC: 9.8 10*3/uL (ref 4.0–11.0)

## 2013-03-24 LAB — BASIC METABOLIC PANEL
Anion Gap: 17.3 mMol/L (ref 7.0–18.0)
BUN / Creatinine Ratio: 13.2 Ratio (ref 10.0–30.0)
BUN: 12 mg/dL (ref 7–22)
CO2: 19.1 mMol/L — ABNORMAL LOW (ref 20.0–30.0)
Calcium: 8.8 mg/dL (ref 8.5–10.5)
Chloride: 103 mMol/L (ref 98–110)
Creatinine: 0.91 mg/dL (ref 0.80–1.30)
EGFR: >60 mL/min/{1.73_m2}
Glucose: 134 mg/dL — ABNORMAL HIGH (ref 70–99)
Osmolality Calc: 272 mOsm/kg — ABNORMAL LOW (ref 275–300)
Potassium: 4.4 mMol/L (ref 3.5–5.3)
Sodium: 135 mMol/L — ABNORMAL LOW (ref 136–147)

## 2013-03-24 LAB — VH DEXTROSE STICK GLUCOSE
Glucose POCT: 118 mg/dL — ABNORMAL HIGH (ref 70–99)
Glucose POCT: 146 mg/dL — ABNORMAL HIGH (ref 70–99)
Glucose POCT: 151 mg/dL — ABNORMAL HIGH (ref 70–99)
Glucose POCT: 245 mg/dL — ABNORMAL HIGH (ref 70–99)

## 2013-03-24 LAB — CANCER ANTIGEN 19-9: CA 19-9: 41 U/mL — ABNORMAL HIGH (ref <34)

## 2013-03-24 SURGERY — LAPAROSCOPIC, CHOLECYSTECTOMY, CHOLANGIOGRAM
Anesthesia: Anesthesia General | Site: Abdomen | Wound class: Clean Contaminated

## 2013-03-24 MED ORDER — ONDANSETRON HCL 4 MG/2ML IJ SOLN
INTRAMUSCULAR | Status: AC
Start: 2013-03-24 — End: ?
  Filled 2013-03-24: qty 2

## 2013-03-24 MED ORDER — ACETAMINOPHEN 10 MG/ML IV SOLN
INTRAVENOUS | Status: AC
Start: 2013-03-24 — End: ?
  Filled 2013-03-24: qty 100

## 2013-03-24 MED ORDER — FENTANYL CITRATE 0.05 MG/ML IJ SOLN
INTRAMUSCULAR | Status: DC | PRN
Start: 2013-03-24 — End: 2013-03-24
  Administered 2013-03-24: 100 ug via INTRAVENOUS
  Administered 2013-03-24 (×2): 25 ug via INTRAVENOUS
  Administered 2013-03-24 (×2): 50 ug via INTRAVENOUS
  Administered 2013-03-24: 100 ug via INTRAVENOUS

## 2013-03-24 MED ORDER — CEFAZOLIN SODIUM-DEXTROSE 2-3 GM-% IV SOLR
2.0000 g | Freq: Three times a day (TID) | INTRAVENOUS | Status: DC
Start: 2013-03-24 — End: 2013-03-25
  Administered 2013-03-24 – 2013-03-25 (×4): 2 g via INTRAVENOUS
  Filled 2013-03-24 (×4): qty 50

## 2013-03-24 MED ORDER — DEXAMETHASONE SODIUM PHOSPHATE 4 MG/ML IJ SOLN
INTRAMUSCULAR | Status: DC | PRN
Start: 2013-03-24 — End: 2013-03-24
  Administered 2013-03-24: 4 mg via INTRAVENOUS

## 2013-03-24 MED ORDER — IOTHALAMATE MEGLUMINE 60 % IJ SOLN
INTRAMUSCULAR | Status: AC
Start: 2013-03-24 — End: ?
  Filled 2013-03-24: qty 30

## 2013-03-24 MED ORDER — IOTHALAMATE MEGLUMINE 60 % IJ SOLN
INTRAMUSCULAR | Status: DC | PRN
Start: 2013-03-24 — End: 2013-03-24
  Administered 2013-03-24: 15 mL via INTRAMUSCULAR

## 2013-03-24 MED ORDER — ESMOLOL HCL 100 MG/10ML IV SOLN
INTRAVENOUS | Status: AC
Start: 2013-03-24 — End: ?
  Filled 2013-03-24: qty 10

## 2013-03-24 MED ORDER — DEXAMETHASONE SODIUM PHOSPHATE 4 MG/ML IJ SOLN
INTRAMUSCULAR | Status: AC
Start: 2013-03-24 — End: ?
  Filled 2013-03-24: qty 1

## 2013-03-24 MED ORDER — FENTANYL CITRATE 0.05 MG/ML IJ SOLN
INTRAMUSCULAR | Status: AC
Start: 2013-03-24 — End: ?
  Filled 2013-03-24: qty 2

## 2013-03-24 MED ORDER — LIDOCAINE HCL (PF) 2 % IJ SOLN
INTRAMUSCULAR | Status: AC
Start: 2013-03-24 — End: ?
  Filled 2013-03-24: qty 10

## 2013-03-24 MED ORDER — ASPIRIN 81 MG PO CHEW
81.0000 mg | CHEWABLE_TABLET | Freq: Every day | ORAL | Status: DC
Start: 2013-03-24 — End: 2013-03-25
  Administered 2013-03-24 – 2013-03-25 (×2): 81 mg via ORAL
  Filled 2013-03-24 (×2): qty 1

## 2013-03-24 MED ORDER — BUPIVACAINE-EPINEPHRINE (PF) 0.25-1:200000 % IJ SOLN
INTRAMUSCULAR | Status: DC | PRN
Start: 2013-03-24 — End: 2013-03-24
  Administered 2013-03-24: 20 mL

## 2013-03-24 MED ORDER — FENTANYL CITRATE 0.05 MG/ML IJ SOLN
INTRAMUSCULAR | Status: AC
Start: 2013-03-24 — End: ?
  Filled 2013-03-24: qty 5

## 2013-03-24 MED ORDER — ROCURONIUM BROMIDE 50 MG/5ML IV SOLN
INTRAVENOUS | Status: DC | PRN
Start: 2013-03-24 — End: 2013-03-24
  Administered 2013-03-24: 50 mg via INTRAVENOUS

## 2013-03-24 MED ORDER — BUPIVACAINE-EPINEPHRINE (PF) 0.25-1:200000 % IJ SOLN
INTRAMUSCULAR | Status: AC
Start: 2013-03-24 — End: ?
  Filled 2013-03-24: qty 30

## 2013-03-24 MED ORDER — OXYCODONE-ACETAMINOPHEN 5-325 MG PO TABS
1.0000 | ORAL_TABLET | ORAL | Status: DC | PRN
Start: 2013-03-24 — End: 2013-03-25
  Administered 2013-03-24 – 2013-03-25 (×2): 2 via ORAL
  Filled 2013-03-24 (×2): qty 2

## 2013-03-24 MED ORDER — PROPOFOL 10 MG/ML IV EMUL
INTRAVENOUS | Status: AC
Start: 2013-03-24 — End: ?
  Filled 2013-03-24: qty 20

## 2013-03-24 MED ORDER — NEOSTIGMINE METHYLSULFATE 0.5 MG/ML IJ SOLN
INTRAMUSCULAR | Status: AC
Start: 2013-03-24 — End: ?
  Filled 2013-03-24: qty 10

## 2013-03-24 MED ORDER — VH PHENYLEPHRINE 40 MCG/ML IV BOLUS (ANESTHESIA)
PREFILLED_SYRINGE | INTRAVENOUS | Status: DC | PRN
Start: 2013-03-24 — End: 2013-03-24
  Administered 2013-03-24: 120 ug via INTRAVENOUS
  Administered 2013-03-24: 80 ug via INTRAVENOUS
  Administered 2013-03-24: 120 ug via INTRAVENOUS
  Administered 2013-03-24: 40 ug via INTRAVENOUS

## 2013-03-24 MED ORDER — ONDANSETRON HCL 4 MG/2ML IJ SOLN
INTRAMUSCULAR | Status: DC | PRN
Start: 2013-03-24 — End: 2013-03-24
  Administered 2013-03-24: 4 mg via INTRAVENOUS

## 2013-03-24 MED ORDER — ACETAMINOPHEN 10 MG/ML IV SOLN
INTRAVENOUS | Status: DC | PRN
Start: 2013-03-24 — End: 2013-03-24
  Administered 2013-03-24: 1000 mg via INTRAVENOUS

## 2013-03-24 MED ORDER — ROCURONIUM BROMIDE 50 MG/5ML IV SOLN
INTRAVENOUS | Status: AC
Start: 2013-03-24 — End: ?
  Filled 2013-03-24: qty 5

## 2013-03-24 MED ORDER — OXYCODONE-ACETAMINOPHEN 5-325 MG PO TABS
1.0000 | ORAL_TABLET | ORAL | Status: DC | PRN
Start: 2013-03-24 — End: 2013-03-24

## 2013-03-24 MED ORDER — CISATRACURIUM BESYLATE (PF) 10 MG/5ML IV SOLN
INTRAVENOUS | Status: DC | PRN
Start: 2013-03-24 — End: 2013-03-24
  Administered 2013-03-24: 2 mg via INTRAVENOUS

## 2013-03-24 MED ORDER — VH PHENYLEPHRINE 40 MCG/ML IV BOLUS (ANESTHESIA)
PREFILLED_SYRINGE | INTRAVENOUS | Status: AC
Start: 2013-03-24 — End: ?
  Filled 2013-03-24: qty 20

## 2013-03-24 MED ORDER — VH PHENYLEPHRINE INFUSION 20 MG/250 ML (SIMPLE)
INTRAVENOUS | Status: AC
Start: 2013-03-24 — End: ?
  Filled 2013-03-24: qty 250

## 2013-03-24 MED ORDER — GLYCOPYRROLATE 0.4 MG/2ML IJ SOLN
INTRAMUSCULAR | Status: AC
Start: 2013-03-24 — End: ?
  Filled 2013-03-24: qty 2

## 2013-03-24 MED ORDER — GLYCOPYRROLATE 0.2 MG/ML IJ SOLN
INTRAMUSCULAR | Status: DC | PRN
Start: 2013-03-24 — End: 2013-03-24
  Administered 2013-03-24: 0.4 mg via INTRAVENOUS

## 2013-03-24 MED ORDER — PROPOFOL 10 MG/ML IV EMUL
INTRAVENOUS | Status: DC | PRN
Start: 2013-03-24 — End: 2013-03-24
  Administered 2013-03-24: 50 mg via INTRAVENOUS
  Administered 2013-03-24: 150 mg via INTRAVENOUS
  Administered 2013-03-24: 50 mg via INTRAVENOUS

## 2013-03-24 MED ORDER — LACTATED RINGERS IV SOLN
INTRAVENOUS | Status: DC | PRN
Start: 2013-03-24 — End: 2013-03-24

## 2013-03-24 MED ORDER — NEOSTIGMINE METHYLSULFATE 0.5 MG/ML IJ SOLN
INTRAMUSCULAR | Status: DC | PRN
Start: 2013-03-24 — End: 2013-03-24
  Administered 2013-03-24: 3 mg via INTRAVENOUS

## 2013-03-24 MED ORDER — CISATRACURIUM BESYLATE 20 MG/10ML IV SOLN
INTRAVENOUS | Status: AC
Start: 2013-03-24 — End: ?
  Filled 2013-03-24: qty 10

## 2013-03-24 MED ORDER — LACTATED RINGERS IV SOLN
INTRAVENOUS | Status: DC
Start: 2013-03-24 — End: 2013-03-25

## 2013-03-24 SURGICAL SUPPLY — 44 items
CANNISTER 1200CC W/LOCK LID (Supply) ×2 IMPLANT
CANNISTER SUCTION 3000C (Supply) ×2 IMPLANT
CATH IV MTL HUB 14GA X 2IN (Supply) IMPLANT
CATH OPEN END URETERAL 4 FR (TDC (Tubes, Draines, Catheters)) ×1
CATH OPEN END URETERAL 4 FR (TDC (Tubes, Drains, Catheters)) ×1 IMPLANT
CHLORAPREP W/ORANGE TINT 26ML (Supply) ×2 IMPLANT
CORD DISPOSABLE CAUTERY 10FT (Supply) ×2 IMPLANT
DRAPE C-ARM X-RAY (Supply) ×2 IMPLANT
DRSG COVADERM 2 X 2 (Dressings) ×4
DRSG COVADERM 2X2 (Dressings) ×4 IMPLANT
GLOVE BIOGEL UND PI IND SZ 8 (Supply) ×2 IMPLANT
GLOVE BIOGEL UT M PI SUR SZ7.5 (Supply) ×2 IMPLANT
GOWN IMPERV LG W/TOWEL REUS (Supply) IMPLANT
GOWN STD LG W/TOWEL REUSE (Supply) ×4 IMPLANT
GOWN WARMING  STANDARD BAIRPAW (Supply) IMPLANT
HEMOCLIP MED/LARGE (Supply) ×4 IMPLANT
HOOK L  60-5163-004 (Supply) ×2 IMPLANT
PACK ABDOMINAL LAP CDS760057 (Supply) ×2 IMPLANT
PAD ARMBOARD 20 X 8 (Supply) ×2 IMPLANT
SLEEVE 5MM Z-THREAD (Supply) ×4 IMPLANT
SOL SALINE .9% 1000ML  LF (Solutions) ×2 IMPLANT
SOL SALINE IRRIG 500ML (Solutions) ×2 IMPLANT
SOL WATER STERILE IRRG 1000ML (Solutions) ×2 IMPLANT
SPONGE ENDOSCOPIC KITTNER (Supply) ×2 IMPLANT
STOPCOCK 2-4WAY DISCOFIX (Supply) ×2 IMPLANT
SUT PDS II 1 Z880G (Supply) IMPLANT
SUT SILK 0 A186H (Supply) IMPLANT
SUT SILK 2-0 C012D (Supply) IMPLANT
SUT VICRYL 0 J112T (Supply) ×2 IMPLANT
SUT VICRYL 0 J346H 36 IN CT-1 (Supply) IMPLANT
SUT VICRYL 0 J603H (Supply) ×2 IMPLANT
SUT VICRYL 3-0 J416H (Supply) IMPLANT
SUT VICRYL 4-0 J496H (Supply) ×4 IMPLANT
SYRINGE 30CC LL WIDE FLANGE (Supply) ×2 IMPLANT
SYSTEM INZII RETRIEVAL (Supply) ×2 IMPLANT
TOWEL (FAN-FOLD) 6/PK (Supply) ×4 IMPLANT
TRAP STERILE SPECIMEN 40CC (Supply) IMPLANT
TROCAR 11MM BLADED THREADED (Supply) ×2 IMPLANT
TROCAR 12MM GELPORT BALL. HAS (Supply) ×2 IMPLANT
TROCAR 5MM BLADED Z-THREAD (Supply) ×2 IMPLANT
TUBING INSUFFLAT (Supply) ×2 IMPLANT
TUBING IRRIG SINGLE TUBE SET (Supply) ×2 IMPLANT
TUBING SUCTION 10FT STERILE (Supply) IMPLANT
UNDERPAD BLUE 23X36  LF (Supply) ×4 IMPLANT

## 2013-03-24 NOTE — Consults (Addendum)
CONSULTATION - ACCESS   Name:  Henry Hines, Henry Hines                 MR#:  [id number]               Date:  03/24/2013       Requesting Physician:  Dr. Loreta Ave (received call from physician)  Reason for Consultation:  Biliary pancreatitis    IMPRESSION:   68 y.o. male  with Common bile duct dilatation.  Nontoxic.     Active Hospital Problems    Diagnosis   . Diabetes mellitus type 2 in nonobese   . Common bile duct dilatation   . Colic, biliary   . Biliary colic   . AKI (acute kidney injury)        PLAN:   OR today for lap chole with IOC.       CC:  Patient complains of upper abdominal pain.      HPI:   68 y.o. male  presents with complain of abdominal pain located in the epigastric, RUQ. The pain is described as sharp. Onset was 4 days ago. Symptoms have been stable since. Aggravating factors include activity and pressure.  Alleviating factors include none. Associated symptoms include nausea and vomiting. The patient denies chills, constipation, diarrhea, fever, hematochezia, hematuria and melena.  The pain does radiate to the back.  He had persistent abdominal and back pain until last night.  Had episode of emesis at that time as well.  Then, pain resolved quickly.  This AM, denies significant abdominal pain.  Normal EGD performed on 03/23/13.    PMH:   He  has a past medical history of Diabetes mellitus.     PSH:   Ex lap for ruptured appendicitis (bowel resection); robotic prostatectomy     Social History:   He  reports that he has quit smoking. He does not have any smokeless tobacco history on file. He reports that he does not drink alcohol.    Family History:   His family history is not on file.       Home Medications:     Prior to Admission medications    Medication Sig Start Date End Date Taking? Authorizing Provider   aspirin EC 81 MG EC tablet Take by mouth daily.   Yes [provider]   glipiZIDE (GLUCOTROL) 10 MG tablet  01/18/13   [provider]   JANUVIA 100 MG tablet  03/09/13   [provider]   lisinopril (PRINIVIL,ZESTRIL) 2.5 MG tablet  02/09/13   [provider]   metFORMIN (GLUCOPHAGE) 500 MG tablet  12/22/12   [provider]   omeprazole (PRILOSEC) 40 MG capsule  01/18/13   [provider]   pravastatin (PRAVACHOL) 40 MG tablet  01/18/13   [provider]   ranitidine (ZANTAC) 150 MG tablet  02/15/13   [provider]          Hospital Medications:        aspirin 81 mg Oral Daily   ceFAZolin 2 g Intravenous Q8H   heparin (porcine) 5,000 Units Subcutaneous Q8H SCH   insulin aspart 1-4 Units Subcutaneous TID AC   lisinopril 2.5 mg Oral Daily   pantoprazole 40 mg Oral QAM AC   pravastatin 40 mg Oral QHS   sodium chloride (PF) 3 mL Intravenous Q8H        Allergies:   He  has no known allergies.     ROS:   Pertinent  details as detailed in HPI.  Other systems negative.    Physical Exam:   Blood pressure 161/84, pulse 80, temperature 98.1 F (36.7 C), temperature source Oral, resp. rate 18, height 1.829 m (6'), weight 81.829 kg (180 lb 6.4 oz), SpO2 100.00%.   General:  appears stated age, no acute distress  Chest:  lungs clear to ausculation, equal breath sounds  Cardiac:  regular rate and rhythm without murmurs/rubs/gallops  Abdomen:  normoactive bowel sounds, soft, mild tenderness at epigastric without rebound and without guarding, non-distended  Extremities:  no edema, peripheral pulses 2+ and symmetric    Labs:      Results     Procedure Component Value Units Date/Time    Dextrose Stick Glucose [[id number]]  (Abnormal) Collected:03/24/13 1604    Specimen Information:Blood Updated:03/24/13 1621     Glucose, POCT 151 (H) mg/dL     Cancer Antigen 08-6 [[id number]]  (Abnormal) Collected:03/23/13 0745    Specimen Information:Blood / Blood Updated:03/24/13 1558     CA 19-9 41 (H) U/mL     Dextrose Stick Glucose [[id number]]  (Abnormal) Collected:03/24/13 1429    Specimen Information:Blood Updated:03/24/13 1447     Glucose, POCT 146 (H) mg/dL      Dextrose Stick Glucose [[id number]]  (Abnormal) Collected:03/24/13 0656    Specimen Information:Blood Updated:03/24/13 0712     Glucose, POCT 118 (H) mg/dL     Basic Metabolic Panel [[id number]]  (Abnormal) Collected:03/24/13 0609    Specimen Information:Blood / Plasma Updated:03/24/13 0656     Sodium 135 (L) mMol/L      Potassium 4.4 mMol/L      Chloride 103 mMol/L      CO2 19.1 (L) mMol/L      CALCIUM 8.8 mg/dL      Glucose 595 (H) mg/dL      Creatinine 6.38 mg/dL      BUN 12 mg/dL      Anion Gap 75.6 mMol/L      BUN/Creatinine Ratio 13.2 Ratio      EGFR >60 mL/min/1.67m2      Osmolality Calc 272 (L) mOsm/kg     CBC [[id number]] Collected:03/24/13 0609    Specimen Information:Blood / Blood Updated:03/24/13 0642     WBC 9.8 K/cmm      RBC 4.62 M/cmm      Hemoglobin 13.8 gm/dL      Hematocrit 41.6 %      MCV 85 fL      MCH 30 pg      MCHC 35 gm/dL      RDW 60.6 %      PLT CT 280 K/cmm      MPV 6.5 fL     Dextrose Stick Glucose [[id number]]  (Abnormal) Collected:03/23/13 2213    Specimen Information:Blood Updated:03/23/13 2230     Glucose, POCT 111 (H) mg/dL            Radiology:   CT ABDOMEN/PELVIS (03/22/13)  IMPRESSION:   Changes of chronic calcific pancreatitis. Questionable superimposed acute pancreatitis with infiltrative changes seen   extending into the porta hepatis. May also consider duodenitis and underlying peptic ulcer disease. Moderate distention   of the gallbladder. There is mild biliary tract dilatation which could be related to underlying pancreatitis. No   calcific stones appreciated within the common bile duct.    MRI ABDOMEN (03/23/13)  IMPRESSION:   1. Pancreatitis with multiple stones seen within the pancreatic duct, some of which appear to cause obstruction.   2. Mild narrowing of the common bile duct  within the pancreatic head, which could be related to pancreatitis, though a   constricting lesion cannot be excluded at this time.        Leory Plowman, MD   ACCESS [phone] or [phone]

## 2013-03-24 NOTE — Anesthesia Postprocedure Evaluation (Signed)
Anesthesia Post Evaluation    Patient: Henry Hines    Procedures performed: Procedure(s) with comments:  LAPAROSCOPIC, CHOLECYSTECTOMY, CHOLANGIOGRAM - LAP CHOLE    ROOM 516      Anesthesia type: General ETT    Patient location:Phase I PACU    Last vitals:   Filed Vitals:    03/24/13 1416   BP: 140/68   Pulse: 90   Temp: 98.2 F (36.8 C)   Resp: 16   SpO2: 100%       Post pain: Patient not complaining of pain, continue current therapy      Mental Status:awake    Respiratory Function: tolerating nasal cannula    Cardiovascular: stable    Nausea/Vomiting: patient not complaining of nausea or vomiting    Hydration Status: adequate    Post assessment: no apparent anesthetic complications

## 2013-03-24 NOTE — Consults (Signed)
CONSULTATION    Date Time: 03/24/2013 8:54 AM  Patient Name: Henry Hines, Henry Hines  MRN#: [id number]  DOB: 1946/02/07  Requesting Physician: Cala Bradford, MD      Reason for Consultation:   Abdominal Pain and abnormal [place]    Assessment:   Diabetes mellitus type 2  Common bile duct dilatation  Pancreatitis    Abdominal Pain.  No discrete mass lesion in pancreas, although there is evidence of pancreatitis and stone formation in the pancreatic duct.    Plan:   MRCP  EGD scheduled with Dr. Kizzie Ide today.  Ultimately, the patient will be referred by Dr. Kizzie Ide to [facility] to assess whether any interventional endoscopic procedures may apply to his case of chronic pancreatitis. It may be advisable to perform EUS to rule out a subtle mass in the head of the pancreas at that time, but I would leave this to the discretion of the treating physicians at [facility].  Will try to arrange for cholecystectomy on this admission.    History:   Henry Hines is a 68 y.o. male who presents to the hospital on 03/22/2013 with abdominal pain that started on 03/20/2013 after doing some physical work around the house, the pain continued to increase which lead him to present to the ER.  He reports a history of pancreatitis 30 years ago but has no other symptoms since that time.  He denies fever, chills, nausea, vomiting, diarrhea, constipation, changes in stool, difficulty urinating, loss of appetite, weight changes, chest pain and SOB.  He has a history of ETOH and smoking for which he quit 30 years ago.  He has no family history of cancer and reports having DM2 and history of prostate cancer.  He is retired as of last week, he is married and reports that his daughter is currently in the hospital with pancreatitis and undergoing a cholecystectomy.    Past Medical History:     Past Medical History   Diagnosis Date   . Diabetes mellitus        Past Surgical History:   History reviewed. No pertinent past surgical history.    Family History:   History reviewed. No  pertinent family history.  Daughter- currently with pancreatitis. No history of pancreatic cancer.      Social History:     History     Social History   . Marital Status: Married     Spouse Name: N/A     Number of Children: N/A   . Years of Education: N/A     Social History Main Topics   . Smoking status: Former Games developer   . Smokeless tobacco: Not on file   . Alcohol Use: No   . Drug Use:    . Sexually Active: Not on file     Other Topics Concern   . Not on file     Social History Narrative   . No narrative on file       Allergies:   No Known Allergies    Medications:     Current Facility-Administered Medications   Medication Dose Route Frequency   . heparin (porcine)  5,000 Units Subcutaneous Q8H SCH   . insulin aspart  1-4 Units Subcutaneous TID AC   . lisinopril  2.5 mg Oral Daily   . pantoprazole  40 mg Oral QAM AC   . pravastatin  40 mg Oral QHS   . sodium chloride (PF)  3 mL Intravenous Q8H       Review of  Systems:   A comprehensive review of systems was: Negative except history of DM2, pancreatitis and prostate CA.    Physical Exam:     Filed Vitals:    03/24/13 0656   BP: 150/85   Pulse: 88   Temp: 98.9 F (37.2 C)   Resp: 17   SpO2: 99%       Intake and Output Summary (Last 24 hours) at Date Time    Intake/Output Summary (Last 24 hours) at 03/24/13 0854  Last data filed at 03/23/13 1050   Gross per 24 hour   Intake    450 ml   Output      0 ml   Net    450 ml       General appearance - alert, well appearing, and in no distress  Mental status - alert, oriented to person, place, and time  Chest - clear to auscultation, no wheezes, rales or rhonchi, symmetric air entry  Heart - normal rate, regular rhythm, normal S1, S2, no murmurs, rubs, clicks or gallops  Abdomen - tenderness noted abdomen - epigastric  Neurological - alert, oriented, normal speech, no focal findings or movement disorder noted  Musculoskeletal - no joint tenderness, deformity or swelling    Labs Reviewed:     Results     Procedure Component  Value Units Date/Time    Dextrose Stick Glucose [[id number]]  (Abnormal) Collected:03/24/13 0656    Specimen Information:Blood Updated:03/24/13 0712     Glucose, POCT 118 (H) mg/dL     Basic Metabolic Panel [[id number]]  (Abnormal) Collected:03/24/13 0609    Specimen Information:Blood / Plasma Updated:03/24/13 0656     Sodium 135 (L) mMol/L      Potassium 4.4 mMol/L      Chloride 103 mMol/L      CO2 19.1 (L) mMol/L      CALCIUM 8.8 mg/dL      Glucose 782 (H) mg/dL      Creatinine 9.56 mg/dL      BUN 12 mg/dL      Anion Gap 21.3 mMol/L      BUN/Creatinine Ratio 13.2 Ratio      EGFR >60 mL/min/1.70m2      Osmolality Calc 272 (L) mOsm/kg     CBC [[id number]] Collected:03/24/13 0609    Specimen Information:Blood / Blood Updated:03/24/13 0642     WBC 9.8 K/cmm      RBC 4.62 M/cmm      Hemoglobin 13.8 gm/dL      Hematocrit 62.9 %      MCV 85 fL      MCH 30 pg      MCHC 35 gm/dL      RDW 52.8 %      PLT CT 280 K/cmm      MPV 6.5 fL     Dextrose Stick Glucose [[id number]]  (Abnormal) Collected:03/23/13 2213    Specimen Information:Blood Updated:03/23/13 2230     Glucose, POCT 111 (H) mg/dL     Dextrose Stick Glucose [[id number]]  (Abnormal) Collected:03/23/13 1625    Specimen Information:Blood Updated:03/23/13 1643     Glucose, POCT 133 (H) mg/dL     Dextrose Stick Glucose [[id number]]  (Abnormal) Collected:03/23/13 1132    Specimen Information:Blood Updated:03/23/13 1200     Glucose, POCT 147 (H) mg/dL     Cancer Antigen 63-8 [[id number]] Collected:03/23/13 0745    Specimen Information:Blood / Blood Updated:03/23/13 0933    CEA [[id number]] Collected:03/23/13 0745    Specimen Information:Blood / Blood Updated:03/23/13 0908  CEA-Abbott 1.91 ng/mL             Rads:   Radiological Procedure reviewed.     Velda Shell, MD  Surgical Oncology          A total of 30 minutes were spent face-to-face with the patient during this encounter and over half of that time was spent on counseling and coordination of care.  I have seen and  examined the patient and have edited and agree with the note written by the nurse practitioner, who I directly supervised.    Velda Shell, MD

## 2013-03-24 NOTE — Progress Notes (Signed)
Resting in bed.  Refused insulin.  States "pin level is 4/10."  No needs voiced.

## 2013-03-24 NOTE — Progress Notes (Signed)
PROGRESS NOTE - [facility]    Date Time: 03/24/2013 7:49 AM  Patient Name: Henry Hines  Attending Physician: Cala Bradford, MD  Today's date: 03/24/2013  Length of Stay: 2  Assessment:                                                                                       [facility]   Principal Problem:   *Common bile duct dilatation  Active Problems:   Diabetes mellitus type 2 in nonobese   Colic, biliary   Biliary colic   AKI (acute kidney injury)        Plan:                                                                                                    [facility]   1.Common bile duct dilatation with pancreatic stone , with colic pain. The plan is to   Pain control   Gastro input appreciated ( Dr Kizzie Ide)   Surgery ( Dr Loreta Ave) called and recommend CT abdomen    Possible surgical removal of gall bladder    No cardiopulmonary issues, able to walk with no chest pain nor any dizziness, meets METS 7, the surgical removal of gallbladder outweighs his condition. He is low to intermediate risk for non cardiac surgery.     2. Diabetes mellitus type 2 in non-obese   Hold oral medication   Insulin coverage for blood sugar monitoring     3.Colic, biliary    Continue pain control    Lipase and LFT are within normal limit    4. AKI (acute kidney injury) improved  secondary to dehydration trending down   Continue IV fluid hydration with N/s   5. Hypertension stable   Continue home medication   6.DVT prophylaxis on heparin sq    Lines:                                                                                                  [employer]     Active [employer] / CVC Line / PIV Line / Drain / Airway / Intraosseous Line / Epidural Line / ART Line / Line / Wound / Pressure Ulcer / NG/OG Tube     Name   Placement date   Placement time   Site   Days  Peripheral IV 03/22/13 Right Antecubital  03/22/13   1402   Antecubital   less than 1          Subjective                                                                                           [facility]     CC: Common bile duct dilatation    He had occasional abdominal pain but no vomiting. He is waiting for EGd today.      Review of Systems:                                                                           [facility]     Constitutional:  Denies fever or fatigue.    ENT:  Denies sore throat or trouble swallowing.    CV:  Denies chest pain or palpitations.    Respiratory:  Denies SOB or cough.    GI:  Claims to have or abdominal pain but denies nausea, vomiting, diarrhea, constipation .    GU:  Denies pain or burning with urination.    MS:  Denies new muscle or joint pain.    Skin:  Denies rash.    Heme: Denies any bleeding.    Physical Exam:                                                                                   [facility]   Temp:  [97.4 F (36.3 C)-98.9 F (37.2 C)] 98.9 F (37.2 C)  Heart Rate:  [70-124] 88   Resp Rate:  [11-18] 17   BP: (123-181)/(68-104) 150/85 mmHg    Intake/Output Summary (Last 24 hours) at 03/24/13 0749  Last data filed at 03/23/13 1050   Gross per 24 hour   Intake    450 ml   Output      0 ml   Net    450 ml     General: awake, alert, oriented x 3; in mild pain distress.   HEENT: perrla, eomi, sclera anicteric oropharynx clear without lesions, mucous membranes moist   Neck: supple, no lymphadenopathy, no thyromegaly, no JVD, no carotid bruits   Cardiovascular: regular rate and rhythm, no murmurs, rubs or gallops   Lungs: clear to auscultation bilaterally, without wheezing, rhonchi, or rales   Abdomen: right upper quadrant tenderness with ? Murphy's sign, but soft, non-distended; no palpable masses, normoactive bowel sounds, no rebound or guarding   Extremities: no clubbing, cyanosis, or edema   Neuro: cranial nerves grossly  intact, strength 5/5 in upper and lower extremities, sensation intact,   Skin: no rashes or lesions noted  Meds:                                                                                                   [facility]     Estimated Creatinine Clearance: 84.1 ml/min (based on Cr of 0.91).    Current Facility-Administered Medications   Medication Dose Route Frequency   . heparin (porcine)  5,000 Units Subcutaneous Q8H SCH   . insulin aspart  1-4 Units Subcutaneous TID AC   . lisinopril  2.5 mg Oral Daily   . pantoprazole  40 mg Oral QAM AC   . pravastatin  40 mg Oral QHS   . sodium chloride (PF)  3 mL Intravenous Q8H       PRN medications: acetaminophen, dextrose, dextrose, glucagon (rDNA), HYDROmorphone, naloxone, ondansetron, [COMPLETED] ondansetron, ondansetron, [DISCONTINUED] meperidine, [DISCONTINUED] midazolam, [DISCONTINUED] ondansetron, [DISCONTINUED] ondansetron    IV Drips:         . sodium chloride 100 mL/hr at 03/24/13 0419       Labs and Imaging:                                                                             [facility]     Results     Procedure Component Value Units Date/Time    Dextrose Stick Glucose [[id number]]  (Abnormal) Collected:03/24/13 0656    Specimen Information:Blood Updated:03/24/13 0712     Glucose, POCT 118 (H) mg/dL     Basic Metabolic Panel [[id number]]  (Abnormal) Collected:03/24/13 0609    Specimen Information:Blood / Plasma Updated:03/24/13 0656     Sodium 135 (L) mMol/L      Potassium 4.4 mMol/L      Chloride 103 mMol/L      CO2 19.1 (L) mMol/L      CALCIUM 8.8 mg/dL      Glucose 696 (H) mg/dL      Creatinine 2.95 mg/dL      BUN 12 mg/dL      Anion Gap 28.4 mMol/L      BUN/Creatinine Ratio 13.2 Ratio      EGFR >60 mL/min/1.46m2      Osmolality Calc 272 (L) mOsm/kg     CBC [[id number]] Collected:03/24/13 0609    Specimen Information:Blood / Blood Updated:03/24/13 0642     WBC 9.8 K/cmm      RBC 4.62 M/cmm      Hemoglobin 13.8 gm/dL      Hematocrit 72.5 %      MCV 85 fL      MCH 30 pg      MCHC 35 gm/dL      RDW 36.6 %      PLT CT 280 K/cmm  MPV 6.5 fL     Dextrose Stick Glucose [[id number]]  (Abnormal)  Collected:03/23/13 2213    Specimen Information:Blood Updated:03/23/13 2230     Glucose, POCT 111 (H) mg/dL     Dextrose Stick Glucose [[id number]]  (Abnormal) Collected:03/23/13 1625    Specimen Information:Blood Updated:03/23/13 1643     Glucose, POCT 133 (H) mg/dL     Dextrose Stick Glucose [[id number]]  (Abnormal) Collected:03/23/13 1132    Specimen Information:Blood Updated:03/23/13 1200     Glucose, POCT 147 (H) mg/dL     Cancer Antigen 08-6 [[id number]] Collected:03/23/13 0745    Specimen Information:Blood / Blood Updated:03/23/13 0933    CEA [[id number]] Collected:03/23/13 0745    Specimen Information:Blood / Blood Updated:03/23/13 0908     CEA-Abbott 1.91 ng/mL     Lipase [[id number]] Collected:03/23/13 0559    Specimen Information:Blood / Plasma Updated:03/23/13 0846     Lipase 62 U/L           Microbiology, reviewed and are significant for:  Microbiology Results     None          Imaging, reviewed and are significant for:  Radiology Results (24 Hour)     Procedure Component Value Units Date/Time    MRI ABDOMEN WO CONTRAST MRCP [[id number]] Collected:03/23/13 1355    Order Status:Completed  Updated:03/23/13 1409    Narrative:    Clinical History:  Abdominal pain, biliary colic, pancreatic duct stones seen on CT    Examination:  Multiplanar MR images of the abdomen were obtained without contrast. The MRCP protocol was used. Additional  3-dimensional reformatted images were provided.    Comparison:  CT 22 March 2013    Findings:  Multiple stones are present within the pancreatic duct. Many are clustered within the pancreatic head, apparently  obstructing the duct, which is dilated within the body and tail. There are also multiple parenchymal calcifications  within the pancreas, presumably all related to chronic pancreatitis. Mild stranding and fluid are present about the  pancreatic head and the adjacent duodenum. This is most likely related pancreatitis, with no convincing evidence of  duodenal involvement.  There is no evidence of pseudocyst or abscess formation    No biliary stones are identified. The common bile duct is mildly distended, measuring 8 mm in diameter. It is slightly  narrowed within the pancreatic head. This is likely related to the pancreatitis, though a constricting lesion in the  pancreatic head cannot be excluded with this unenhanced study.     The liver, spleen, adrenal glands, right kidney and imaged portion of the digestive tract are normal in appearance.  There is 1.5 cm cystic structure in the lower pole of the left kidney. Mild calcification is present with a normal  caliber aorta. No enlarged lymph nodes or masses are identified. Incompletely imaged degenerative changes are present in  the lumbar spine.    Mild airspace disease in the lung bases likely represents atelectasis.      Impression:    1.  Pancreatitis with multiple stones seen within the pancreatic duct, some of which appear to cause obstruction.  2.  Mild narrowing of the common bile duct within the pancreatic head, which could be related to pancreatitis, though a  constricting lesion cannot be excluded at this time.    ReadingStation:[id number]            Signed by: Cala Bradford, MD    [facility]  [address]  [place] [facility] [address]  Pager  [phone]

## 2013-03-24 NOTE — Op Note (Signed)
[facility] ACCESS     DATE OF OPERATION:   03/24/2013     ATTENDING SURGEON:   Terrill Mohr, MD     PREOPERATIVE DIAGNOSES:   Biliary pancreatitis    POSTOPERATIVE DIAGNOSES:   Biliary pancreatitis  Acute cholecystitis    PROCEDURES:   1. Laparoscopic cholecystectomy.   2. Intraoperative cholangiogram    OPERATIVE FINDINGS:   Positive erythema and distention of the gallbladder, positive   Gallbladder slude. On intraoperative cholangiogram, no common bile   duct filling defects.     ANESTHESIA:   General.     INTRAVENOUS FLUIDS:   1400 ml crystalloid    ESTIMATED BLOOD LOSS:   80 ml    SPECIMEN:   Gallbladder with contents.     INDICATION FOR PROCEDURE:   Patient is a 68 year old man, who presented to [facility] for abdominal pain.  Full evaluation showed evidence of biliary pancreatitis.  Patient is appropriate for surgical intervention.     OPERATIVE COURSE:   The patient was brought to the operating room and placed in   supine position. After administration of general anesthesia,   patient was successfully endotracheally intubated. Patient had   bilateral serial compression devices placed on lower extremities.   Patient had appropriate IV antibiotics administered. Patient was then   prepped and draped in a sterile fashion. I entered the abdomen through RUQ using 5 mm Optiview trocar port, since he had history of multiple previous abdominal surgeries and was at risk for adhesions to the anterior abdominal wall.  Next, under direct visualization, we made a 2-cm   transverse supraumbilical incision using a #15 blade. We   carried incision down to the level of the midline fascia. We   incised the fascia sharply and inserted a 12-mm trocar port   under. We   insufflated the abdomen to a pressure of 12-15 mmHg. We were   then able to visualize the intraabdominal cavity and placed a   11-mm trocar port in the subxiphoid position. We placed one  additional 5-mm trocar port in his right subcostal position.   There  was no injury to underlying structures with placement of   any trocars. Patient was placed in reverse Trendelenburg and   left lateral decubitus position for adequate visualization. We   then were able to fully visualize the right upper quadrant. The   liver appeared normal. The gallbladder appeared distended and erythematous with gallbladder wall thickening.  Using blunt dissection, we   were able to take down the inflammatory adhesions and fully   visualize the gallbladder. We applied cephalad traction to the   dome of the gallbladder. We were able to use blunt dissection   to identify anterior tubular structure, which was seemed   consistent with the cystic duct. Slightly more bleeding than usual was seen during the process of isolating this structure due to close adherence of cystic artery We obtained a critical view of this structure.  We clipped the cystic duct near the infundibulum. We   then created a ductotomy using micro scissors. Cholangiogram   catheter was inserted and an intraoperative cholangiogram was   performed, which showed no evidence of common bile duct filling   defects, with adequate passage of contrast into the duodenum.   Catheter was removed, and the cystic duct was clipped and   divided using scissors. We identified a posterior   structure, which seemed consistent with the cystic artery.  We clipped and  divided the structure using hook scissors as well. We then used a Bovie in order to excise the   gallbladder from the gallbladder fossa. We then removed the   gallbladder from the intraabdominal cavity using EndoCatch bag.   We reevaluated the gallbladder fossa. We used electrocautery in   order to achieve adequate hemostasis. We re-evaluated cystic   artery and cystic duct stumps.  We were satisfied with integrity   of the clips on the structures.  We then copiously irrigated the   abdomen with warm normal saline. We suctioned out as much as   fluid as possible.  We surveilled the  intraabdominal cavity and   found no further pathology, which required investigation. We   removed the 11-mm trocar port after using a suture passer to   address the fascial defect. We were satisfied with the   integrity of the closure using the Vicryl suture. We then   removed our 5-mm trocar ports under direct visualization. There   was no bleeding from the peritoneal site. We removed the   periumbilical port and evacuated much of the gas as possible.   We used 0 Vicryl on UR stitch in order to place a   figure-of-eight suture to approximate the fascial edges at that   site. We were satisfied with integrity of the closure. We then   used approximately 15 mL of 0.25% Marcaine with epinephrine   for local anesthesia each of skin incision site. The 4-0 Vicryl   then used to approximate the skin edges. Of note, we irrigated   the subcutaneous tissue in the periumbilical port site before   closure. Steri-Strips were applied, and sterile gauze completed   the dressing. General anesthesia was reversed. Patient was   successfully extubated. Patient was transferred to awaiting bed,   was taken to recovery area in stable condition. There were no   complications during this procedure.

## 2013-03-24 NOTE — Progress Notes (Signed)
Spoke with Dr. Pollie Friar about morning dose of heparin and was instructed to hold this dose r/t to patient's surgery. Will continue to monitor.

## 2013-03-24 NOTE — Progress Notes (Signed)
Assumed care of patient at 1900. Wife by his side.  Patient denies any needs at this point in time.  No signs of distress, states some discomfort from laparoscopic surgery today.  Bandages are dry and intact.  Call bell within reach, sr x 3, bed alarm set. Will continue to assess.

## 2013-03-24 NOTE — Anesthesia Preprocedure Evaluation (Signed)
Anesthesia Evaluation    AIRWAY    Mallampati: I    TM distance: >3 FB  Neck ROM: full  Mouth Opening:full   CARDIOVASCULAR    cardiovascular exam normal, regular and normal       DENTAL    No notable dental hx    (+) upper dentures   PULMONARY    pulmonary exam normal and clear to auscultation     OTHER FINDINGS                      Anesthesia Plan    ASA 2     general                     intravenous induction   Detailed anesthesia plan: general endotracheal            informed consent obtained    Plan discussed with CRNA.    ECG reviewed  pertinent labs reviewed

## 2013-03-24 NOTE — Progress Notes (Signed)
Returned to rm from PACU.  A/o x's. 4-incisional site on abd d/i.  Pain rates 6/10.  Family at bedside.  Will monitor.

## 2013-03-24 NOTE — Transfer of Care (Signed)
Anesthesia Transfer of Care Note    Patient: Henry Hines to PACU [place] [facility]    Last vitals:   Filed Vitals:    03/24/13 1416   BP: 140/68   Pulse: 90   Temp: 98.2 F (36.8 C)   Resp: 16   SpO2: 100%       Oxygen: Nasal Cannula @ 3l     Mental Status:awake and alert     Airway: Natural    Cardiovascular Status:  stable      Report given to RN / VSS / No Pain (0/10) / No N/V / MAE's.

## 2013-03-25 ENCOUNTER — Encounter (HOSPITAL_BASED_OUTPATIENT_CLINIC_OR_DEPARTMENT_OTHER): Payer: Self-pay | Admitting: Gastroenterology

## 2013-03-25 DIAGNOSIS — K8010 Calculus of gallbladder with chronic cholecystitis without obstruction: Secondary | ICD-10-CM

## 2013-03-25 LAB — BASIC METABOLIC PANEL
Anion Gap: 12 mMol/L (ref 7.0–18.0)
BUN / Creatinine Ratio: 16.2 Ratio (ref 10.0–30.0)
BUN: 17 mg/dL (ref 7–22)
CO2: 23.8 mMol/L (ref 20.0–30.0)
Calcium: 8.7 mg/dL (ref 8.5–10.5)
Chloride: 104 mMol/L (ref 98–110)
Creatinine: 1.05 mg/dL (ref 0.80–1.30)
EGFR: >60 mL/min/{1.73_m2}
Glucose: 188 mg/dL — ABNORMAL HIGH (ref 70–99)
Osmolality Calc: 277 mOsm/kg (ref 275–300)
Potassium: 4.8 mMol/L (ref 3.5–5.3)
Sodium: 135 mMol/L — ABNORMAL LOW (ref 136–147)

## 2013-03-25 LAB — CBC
Hematocrit: 35.3 % — ABNORMAL LOW (ref 39.0–52.5)
Hemoglobin: 11.8 gm/dL — ABNORMAL LOW (ref 13.0–17.5)
MCH: 29 pg (ref 28–35)
MCHC: 33 gm/dL (ref 32–36)
MCV: 86 fL (ref 80–100)
MPV: 6.7 fL (ref 6.0–10.0)
PLT CT: 254 10*3/uL (ref 130–440)
RBC: 4.11 10*6/uL (ref 4.00–5.70)
RDW: 12.2 % (ref 11.0–14.0)
WBC: 11.2 10*3/uL — ABNORMAL HIGH (ref 4.0–11.0)

## 2013-03-25 LAB — VH DEXTROSE STICK GLUCOSE
Glucose POCT: 189 mg/dL — ABNORMAL HIGH (ref 70–99)
Glucose POCT: 278 mg/dL — ABNORMAL HIGH (ref 70–99)

## 2013-03-25 MED ORDER — OXYCODONE-ACETAMINOPHEN 5-325 MG PO TABS
1.0000 | ORAL_TABLET | ORAL | Status: DC | PRN
Start: 2013-03-25 — End: 2015-09-15

## 2013-03-25 NOTE — Progress Notes (Signed)
C/o abd pain.  Pain rates 6/10.  Medicated with Percocet-2-tablets po per patient request.

## 2013-03-25 NOTE — Progress Notes (Signed)
Spoke with Dr. Pollie Friar regarding advancing patients diet. She advised that needed to come from the surgeon.  Patient tolerated clear and full liquids last evening and is requesting a regular/cc diet.

## 2013-03-25 NOTE — Progress Notes (Signed)
Continuous fluids discontinued per order. Patient tolerating full liquids well.

## 2013-03-25 NOTE — Progress Notes (Signed)
PROGRESS [facility] - [facility]    Date Time: 03/25/2013 8:00 AM  Patient Name: Henry Hines  Attending Physician: Leory Plowman, MD  Today's date: 03/25/2013  Length of Stay: 3  Assessment:                                                                                       [facility] Hospitalists   Principal Problem:   *Common bile duct dilatation  Active Problems:   Diabetes mellitus type 2 in nonobese   Colic, biliary   Biliary colic   AKI (acute kidney injury)        Plan:                                                                                                    [facility] Hospitalists   1. S/p cholecystectomy day 1 for Common bile duct dilatation with pancreatic stone and biliary colic.   Pain control per surgical team  Gastro input appreciated ( Dr Kizzie Ide)   Surgery ( Dr Reola Calkins is following him    2. Diabetes mellitus type 2 in non-obese   Hold oral medication but continue on discharge  Insulin coverage for blood sugar monitoring     3.Colic, biliary    Continue pain control    Lipase and LFT are within normal limit    4. AKI (acute kidney injury) improved  secondary to dehydration trending down     5. Hypertension stable   Continue home medication   6.DVT prophylaxis on heparin sq  On medical stand point, he is ready for discharge. Thank you for giving [place] the opportunity to manage the patient.  Lines:                                                                                                  [employer]     Active [employer] / CVC Line / PIV Line / Drain / Airway / Intraosseous Line / Epidural Line / ART Line / Line / Wound / Pressure Ulcer / NG/OG Tube     Name   Placement date   Placement time   Site   Days    Peripheral IV 03/22/13 Right Antecubital  03/22/13   1402   Antecubital   less than 1          Subjective  [facility]     CC: Common bile duct dilatation    He tolerated food and had  occasional abdominal pain but no vomiting.     Review of Systems:                                                                           [facility]     Constitutional:  Denies fever or fatigue.    ENT:  Denies sore throat or trouble swallowing.    CV:  Denies chest pain or palpitations.    Respiratory:  Denies SOB or cough.    GI:  Claims to have or abdominal pain but denies nausea, vomiting, diarrhea, constipation .    GU:  Denies pain or burning with urination.    MS:  Denies new muscle or joint pain.    Skin:  Denies rash.    Heme: Denies any bleeding.    Physical Exam:                                                                                   [facility] Hospital[facility]   Temp:  [97.5 F (36.4 C)-99.2 F (37.3 C)] 97.5 F (36.4 C)  Heart Rate:  [68-90] 68   Resp Rate:  [14-18] 18   BP: (118-161)/(68-84) 127/77 mmHg    Intake/Output Summary (Last 24 hours) at 03/25/13 0800  Last data filed at 03/24/13 2255   Gross per 24 hour   Intake   1500 ml   Output     80 ml   Net   1420 ml     General: awake, alert, oriented x 3; in mild pain distress.   HEENT: perrla, eomi, sclera anicteric oropharynx clear without lesions, mucous membranes moist   Neck: supple, no lymphadenopathy, no thyromegaly, no JVD, no carotid bruits   Cardiovascular: regular rate and rhythm, no murmurs, rubs or gallops   Lungs: clear to auscultation bilaterally, without wheezing, rhonchi, or rales   Abdomen: slight tenderness but soft, non-distended; no palpable masses, normoactive bowel sounds, no rebound or guarding   Extremities: no clubbing, cyanosis, or edema   Neuro: cranial nerves grossly intact, strength 5/5 in upper and lower extremities, sensation intact,   Skin: no rashes or lesions noted  Meds:                                                                                                  [facility] Hospitalists     Estimated Creatinine Clearance: 72.9 ml/min (based on  Cr of 1.05).    Current Facility-Administered Medications    Medication Dose Route Frequency   . aspirin  81 mg Oral Daily   . ceFAZolin  2 g Intravenous Q8H   . heparin (porcine)  5,000 Units Subcutaneous Q8H SCH   . insulin aspart  1-4 Units Subcutaneous TID AC   . lisinopril  2.5 mg Oral Daily   . pantoprazole  40 mg Oral QAM AC   . pravastatin  40 mg Oral QHS   . sodium chloride (PF)  3 mL Intravenous Q8H       PRN medications: acetaminophen, dextrose, dextrose, glucagon (rDNA), HYDROmorphone, naloxone, ondansetron, ondansetron, oxyCODONE-acetaminophen, [DISCONTINUED] bupivacaine-EPINEPHrine PF, [DISCONTINUED] iothalamate, [DISCONTINUED] oxyCODONE-acetaminophen    IV Drips:         . [DISCONTINUED] sodium chloride Stopped (03/24/13 1537)   . [DISCONTINUED] lactated ringers 100 mL/hr at 03/24/13 1145       Labs and Imaging:                                                                             [facility]     Results     Procedure Component Value Units Date/Time    Basic Metabolic Panel [[id number]]  (Abnormal) Collected:03/25/13 0654    Specimen Information:Blood / Plasma Updated:03/25/13 0758     Sodium 135 (L) mMol/L      Potassium 4.8 mMol/L      Chloride 104 mMol/L      CO2 23.8 mMol/L      CALCIUM 8.7 mg/dL      Glucose 696 (H) mg/dL      Creatinine 2.95 mg/dL      BUN 17 mg/dL      Anion Gap 28.4 mMol/L      BUN/Creatinine Ratio 16.2 Ratio      EGFR >60 mL/min/1.76m2      Osmolality Calc 277 mOsm/kg     CBC [[id number]]  (Abnormal) Collected:03/25/13 0654    Specimen Information:Blood / Blood Updated:03/25/13 0743     WBC 11.2 (H) K/cmm      RBC 4.11 M/cmm      Hemoglobin 11.8 (L) gm/dL      Hematocrit 72.5 (L) %      MCV 86 fL      MCH 29 pg      MCHC 33 gm/dL      RDW 36.6 %      PLT CT 254 K/cmm      MPV 6.7 fL     Dextrose Stick Glucose [[id number]]  (Abnormal) Collected:03/25/13 0657    Specimen Information:Blood Updated:03/25/13 0714     Glucose, POCT 189 (H) mg/dL     Dextrose Stick Glucose [[id number]]  (Abnormal) Collected:03/24/13 2107     Specimen Information:Blood Updated:03/24/13 2124     Glucose, POCT 245 (H) mg/dL     Dextrose Stick Glucose [[id number]]  (Abnormal) Collected:03/24/13 1604    Specimen Information:Blood Updated:03/24/13 1621     Glucose, POCT 151 (H) mg/dL     Cancer Antigen 16-6 [[id number]]  (Abnormal) Collected:03/23/13 0745    Specimen Information:Blood / Blood Updated:03/24/13 1558     CA 19-9 41 (H) U/mL     Dextrose Stick Glucose [[id number]]  (Abnormal) Collected:03/24/13  1429    Specimen Information:Blood Updated:03/24/13 1447     Glucose, POCT 146 (H) mg/dL           Microbiology, reviewed and are significant for:  Microbiology Results     None          Imaging, reviewed and are significant for:  Radiology Results (24 Hour)     Procedure Component Value Units Date/Time    FL FLUORO < 1 HOUR [[id number]] Collected:03/24/13 1337    Order Status:Completed  Updated:03/24/13 1345    Narrative:    Clinical History:  Cholangiogram    Examination:  FL FLUORO < 1 HOUR    Fluoroscopy Time: 29 seconds      Impression:    2 intraoperative fluoroscopic images are performed demonstrating status post cholecystectomy changes with out evidence  of filling defect seen within a prominent common bile duct. There is adequate biliary to bowel opacification of the  bowel.  4. More inflation please see operative report from the same day.    ReadingStation:[id number]            Signed by: Cala Bradford, MD    [facility]  [address]  [place], [place] [address]  Pager [phone]

## 2013-03-25 NOTE — Progress Notes (Signed)
Reviewed discharged instructions and gave a copy of discharged instructions ad prescriptions to patient.  Verbally voiced understanding.

## 2013-03-25 NOTE — Progress Notes (Signed)
Ambulated off the floor with wife and belongings to go home.

## 2013-03-25 NOTE — Discharge Instructions (Signed)
Diet:  Resume previous home diet as tolerated.      Activity:  No strenuous activity or lifting over 10-15 pounds X 2 weeks.         Encourage ambulation as tolerated.  May use stairs if needed.     No working or driving while on narcotic pain medications.    Wound Care:  Remove outer dressing 2 days after surgery.                          Leave steri-strips in place x 10-14 days.                          May shower after outer dressing has been removed.         Instructions:  Call/Return if temperature over 101.0, redness or purulent (pus) drainage or warmth to your incision(s), refractory nausea/vomiting, increased pain not relieved by medications, or new onset of chest pain or shortness or breath.    Use over-the-counter stool softeners and/or Senokot daily to twice daily as needed for constipation.    Cholecystectomy  You've had painful attacks caused by gallstones. To treat the problem, your doctor wants to remove your gallbladder. This surgery is called cholecystectomy. Removing the gallbladder can relieve pain. It will also prevent future attacks. You can live a healthy life without your gallbladder. You may also be able to go back to eating foods you enjoyed before your gallbladder problems started.     Clips close off the bile duct and blood vessels. The gallbladder is then removed.   Before Your Surgery   Tell your provider what medications you take. Include those bought over the counter. Also include herbs or supplements. Be sure to mention if you take prescription blood thinners. This includes Coumadin (warfarin).   Have any tests your provider asks for, such as blood tests.   Don't eat or drink after midnight, the night before your surgery. This includes water, coffee, and mints.  The Day of Surgery  When you arrive, you will prepare for surgery.   An IV line will be put into a vein in your arm or hand. This gives you fluids and medication.   An anesthesiologist will talk with you about  anesthesia. This is medication used to prevent pain. You will receive general anesthesia. This puts you into a state like deep sleep through the procedure.  During Surgery  There are two methods for removing the gallbladder. Your doctor will choose which method is safer for you.   Laparoscopic cholecystectomy. This is most common. During surgery, 2 to 4 small incisions are made. A thin tube with a camera is used. This is called a laparoscope. The scope is put through one of the incisions. It sends images to a video screen. Surgical tools are put through other incisions. The gallbladder is removed using the scope and these tools.   Open cholecystectomy. One larger incision is made. The surgeon sees and works through this incision. Open surgery is most often used when scarring or other factors make it a better choice for you.  In some cases, safety requires a change from laparoscopic to open surgery during the procedure.  After Surgery  You will be sent to a room to wake up from the anesthesia. You will likely go home the same day. In some cases, an overnight stay is needed. When you are released to go home, have a  family member or friend ready to drive you.  Risks and Possible Complications of Gallbladder Surgery  All surgeries have risks. The risks of gallbladder surgery include:   Bleeding   Infection   Injury to the common bile duct or nearby organs   Blood clots in the legs   Prolonged diarrhea   Bile leaks   Hernia at incision site    [address], [address], [place], [place] [address]. All rights reserved. This information is not intended as a substitute for professional medical care. Always follow your healthcare professional's instructions.    ACCESS BOWEL MAINTENANCE GUIDELINES    The use of narcotic pain medications and decreased activity will lead to constipation.  In order to prevent this from happening to you, please follow these recommendations:  1. Drink adequate amounts of  fluid throughout the day.    2. Take an over-the-counter stool softener, such as Colace, once or twice a day depending on the firmness of your stools.  3. You may take another over-the-counter stool softener, such as Senokot or generic Senna, two tablets at bedtime.  4. Add Metamucil or other similar fiber supplement to your diet twice a day.    If constipation persists:   1. Add Milk of Magnesia two (2) tablespoons (30mL) every 6    hours (up to 8 doses).  2. If you are experiencing rectal fullness or pressure, try a Dulcolax and/or   glycerin suppository every 12 hours.      Avoid anti-diarrhea medications once your bowels begin to move.  Loose         stools should resolve on their own after 2-3 days.    Gas relief medications such as Gas-X or Gaviscon are acceptable for use.      Probiotic food products, such as yogurt, are acceptable especially if you are or have been on antibiotics.    Additional Recipe for Success (Natural Stool Softener):    -1 cup of unsweetened applesauce,   - cup of unsweetened prune juice,   -1 cup of unprocessed bran (ex., [employer] brand - can be found at  [employer], [employer],        [employer], or other health food stores).    Mix together and store in refrigerator.    Take 2 tablespoons with a large glass of water daily and expect results 6-8 hours later.  You may add 1 tablespoon every 5-7 days if needed.   *can be used as long as desired, non-addictive, & gentle     Contact the ACCESS Office at [phone] if you have additional questions.

## 2013-03-25 NOTE — Progress Notes (Signed)
CM met w/ Pt and spouse. Pt reports feeling better today and he is hoping for d/c today.   Pt ambulating in room w/ spouse supervision. No d/c needs voiced by either Pt or spouse.     No d/c needs at this time. Pt likely for d/c later today.

## 2013-03-25 NOTE — Progress Notes (Signed)
Called MD regarding patient requesting to go home.  States "will be up to see the patient."

## 2013-03-25 NOTE — Progress Notes (Signed)
'  Patient room 516 would like to advance his diet this morning. May I have an order to do so. Dennie Bible [id number]' to PIN# '[id number]' with carrier '[employer]'

## 2013-03-25 NOTE — Discharge Summary (Signed)
DISCHARGE NOTE    Date Time: 03/25/2013 1:54 PM  Patient Name: Henry Hines  Attending Physician: Leory Plowman, MD    Date of Admission:   03/22/2013    Date of Discharge:   03/25/2013    Reason for Admission:   Biliary tract distention [576.8]  Biliary colic    Problems:   Hospital Problems:  Principal Problem:   *Common bile duct dilatation  Active Problems:   Diabetes mellitus type 2 in nonobese   Colic, biliary   Biliary colic   AKI (acute kidney injury)      Discharge Dx:   Principal Problem:   *Common bile duct dilatation  Active Problems:   Diabetes mellitus type 2 in nonobese   Colic, biliary   Biliary colic   AKI (acute kidney injury)      Consultations:   Dr. Loreta Ave (Surgical Oncology)  Dr. Kizzie Ide (Gastroenterology)  Dr. Wonda Cerise [facility])    Procedures performed:   03/24/13--laparoscopic cholecystectomy    Labs:        Imaging:        Hospital Course:   Admitted to Hospitalist service on 03/22/13.  Concern for biliary obstruction due to neoplasm.  Dr. Loreta Ave called for evaluation. Had imaging (CT abdomen/MRCP/US abdomen)  which ultimately diagnosed pancreatic duct obstruction due to gallstones, no evidence of malignancy.  Dr. Loreta Ave consulted [facility] for possible cholecystectomy due scheduling difficulties.  GI was consulted and performed EGD on 2/10 which was normal.  Recommended referral to [facility] G[facility]ology as outpatient to address pancreatic duct issue (which could not be addressed here in [facility]).  Underwent uneventful lap chole on 2/11.  Postoperatively his diet was readvanced.  He tolerated this well and was also able to be transitioned to oral analgesics.  He was ambulating well and was discharged home POD #1.          Discharge Medications:      [facility] Medication Instructions [id number]    Printed on:03/25/13 1354   Medication Information                      JANUVIA 100 MG tablet               ranitidine (ZANTAC) 150 MG tablet               pravastatin (PRAVACHOL) 40 MG  tablet               omeprazole (PRILOSEC) 40 MG capsule               metFORMIN (GLUCOPHAGE) 500 MG tablet               lisinopril (PRINIVIL,ZESTRIL) 2.5 MG tablet               glipiZIDE (GLUCOTROL) 10 MG tablet               aspirin EC 81 MG EC tablet  Take by mouth daily.             oxyCODONE-acetaminophen (PERCOCET) 5-325 MG per tablet  Take 1-2 tablets by mouth every 4 (four) hours as needed.                 Discharge Instructions:     Diet:  Resume previous home diet as tolerated.      Activity:  No strenuous activity or lifting over 10-15 pounds X 2 weeks.  Encourage ambulation as tolerated.  May use stairs if needed.     No working or driving while on narcotic pain medications.    Wound Care:  Remove outer dressing 2 days after surgery.                          Leave steri-strips in place x 10-14 days.                          May shower after outer dressing has been removed.         Instructions:  Call/Return if temperature over 101.0, redness or purulent (pus) drainage or warmth to your incision(s), refractory nausea/vomiting, increased pain not relieved by medications, or new onset of chest pain or shortness or breath.    Use over-the-counter stool softeners and/or Senokot daily to twice daily as needed for constipation.        Follow-up with [facility]ESS Clinician in [facility] in 7-10 days [phone]       Signed by: Leory Plowman, MD

## 2013-03-26 ENCOUNTER — Encounter: Payer: Self-pay | Admitting: Surgery

## 2013-04-01 ENCOUNTER — Ambulatory Visit (INDEPENDENT_AMBULATORY_CARE_PROVIDER_SITE_OTHER): Payer: Medicare Other | Admitting: Surgery

## 2013-04-01 ENCOUNTER — Encounter (INDEPENDENT_AMBULATORY_CARE_PROVIDER_SITE_OTHER): Payer: Self-pay

## 2013-04-01 VITALS — BP 119/60 | HR 67 | Temp 97.5°F | Ht 72.0 in | Wt 179.6 lb

## 2013-04-01 DIAGNOSIS — Z9889 Other specified postprocedural states: Secondary | ICD-10-CM

## 2013-04-01 DIAGNOSIS — Z9049 Acquired absence of other specified parts of digestive tract: Secondary | ICD-10-CM

## 2013-04-01 DIAGNOSIS — K8010 Calculus of gallbladder with chronic cholecystitis without obstruction: Secondary | ICD-10-CM

## 2013-04-01 NOTE — Progress Notes (Signed)
[facility] Clinic Note-Cholecystectomy  Date Time: 04/01/2013 2:09 PM  Patient Name: Henry Hines, Henry Hines  DoB: 02-16-1945  Age: 68 y.o.   PCP: Anise Salvo, MD     History of Present Illness:   Curry Seefeldt 68 y.o. male is s/p laparscopic cholecystectomy on 03/24/13 by Dr. Reola Calkins. He denies any shortness of breath, difficulty urinating, fevers, chills, nausea, vomiting, diarrhea, constipation, jaundice, dark urine, wound redness, and wound drainage. His appetite has been good. His activity has returned to normal. He is still using narcotic pain medications but minimally, about 1 tablet per day. He has no additional complaints.     Medications:     Prior to Admission medications    Medication Sig Start Date End Date Taking? Authorizing Provider   aspirin EC 81 MG EC tablet Take by mouth daily.   Yes [provider]   Blood Glucose Calibration (EMBRACE CONTROL) LOW Solution  02/18/13  Yes [provider]   Blood Glucose Monitoring Suppl (EMBRACE BLOOD GLUCOSE MONITOR) Device  02/18/13  Yes [provider]   EMBRACE BLOOD GLUCOSE TEST test strip  02/18/13  Yes [provider]   glipiZIDE (GLUCOTROL) 10 MG tablet  01/18/13  Yes [provider]   JANUVIA 100 MG tablet  03/09/13  Yes [provider]   Lancet Devices (SIMPLE DIAGNOSTICS LANCING DEV) Misc  02/18/13  Yes [provider]   lisinopril (PRINIVIL,ZESTRIL) 2.5 MG tablet  02/09/13  Yes [provider]   metFORMIN (GLUCOPHAGE) 500 MG tablet  12/22/12  Yes [provider]   omeprazole (PRILOSEC) 40 MG capsule  01/18/13  Yes [provider]   Pharmacist Choice Lancets Misc  02/18/13  Yes [provider]   pravastatin (PRAVACHOL) 40 MG tablet  01/18/13  Yes [provider]   ranitidine (ZANTAC) 150 MG tablet  02/15/13  Yes [provider]   oxyCODONE-acetaminophen (PERCOCET) 5-325 MG per tablet Take 1-2 tablets by mouth every 4 (four) hours as needed. 03/25/13   Leory Plowman, MD        Review of Systems:   Negative except for noted above.    Physical Exam:   BP 119/60  Pulse 67  Temp 97.5 F (36.4 C)  Ht 1.829 m (6')  Wt 81.466 kg (179 lb 9.6 oz)  BMI 24.35 kg/m2    EYES: anicteric  LUNGS: bilaterally clear to auscultation, no rales or wheezes or rhonchi  HEART: regular rate and rhythm, no murmur  ABD: soft, nontender, bowel sounds present, incision clean, dry and intact, no erythema, no drainage  SKIN: not jaundiced    Pathology:     Specimens    Diagnosis:  GALLBLADDER, CHOLECYSTECTOMY:  -ACUTE CHOLECYSTITIS WITH FOCAL NECROSIS.    Assessment:     1. S/P laparoscopic cholecystectomy         Plan:   This patient may follow up as needed.  This patient is encouraged to call if there are any questions or concerns. This patient is instructed not to lift over 10 to 15 pounds or engage in any strenuous activity x 4 weeks after surgery.      Theotis Burrow, NP; 04/01/2013 2:09 PM         I have reviewed the notes, assessments, and/or procedures performed by Cristi Loron  , I concur with her/his documentation of Dorina Hoyer.    Terrill Mohr MD  [facility] (Acute Care and Emergency Surgery Service)  [facility]  Phone #  [phone]  Pager # [phone]

## 2013-04-04 ENCOUNTER — Emergency Department: Payer: Medicare Other

## 2013-04-04 ENCOUNTER — Encounter: Payer: Self-pay | Admitting: Emergency Medicine

## 2013-04-04 ENCOUNTER — Emergency Department
Admission: EM | Admit: 2013-04-04 | Discharge: 2013-04-04 | Disposition: A | Discharge status: Home / Self Care | Payer: Medicare Other | Attending: Emergency Medicine | Admitting: Emergency Medicine

## 2013-04-04 DIAGNOSIS — M76892 Other specified enthesopathies of left lower limb, excluding foot: Secondary | ICD-10-CM

## 2013-04-04 DIAGNOSIS — M778 Other enthesopathies, not elsewhere classified: Secondary | ICD-10-CM | POA: Insufficient documentation

## 2013-04-04 DIAGNOSIS — Z87891 Personal history of nicotine dependence: Secondary | ICD-10-CM | POA: Insufficient documentation

## 2013-04-04 DIAGNOSIS — M25562 Pain in left knee: Secondary | ICD-10-CM

## 2013-04-04 DIAGNOSIS — E119 Type 2 diabetes mellitus without complications: Secondary | ICD-10-CM | POA: Insufficient documentation

## 2013-04-04 DIAGNOSIS — M25569 Pain in unspecified knee: Secondary | ICD-10-CM | POA: Insufficient documentation

## 2013-04-04 MED ORDER — IBUPROFEN 600 MG PO TABS
600.0000 mg | ORAL_TABLET | Freq: Once | ORAL | Status: AC
Start: 2013-04-04 — End: 2013-04-04
  Administered 2013-04-04: 600 mg via ORAL

## 2013-04-04 MED ORDER — IBUPROFEN 600 MG PO TABS
ORAL_TABLET | ORAL | Status: AC
Start: 2013-04-04 — End: ?
  Filled 2013-04-04: qty 1

## 2013-04-04 NOTE — ED Provider Notes (Signed)
[facility] EMERGENCY DEPARTMENT History and Physical Exam      Patient Name: TRAMAR, BRUECKNER  Encounter Date:  04/04/2013  Attending Physician: Enerson Busing Markeeta Scalf, MD  PCP: Anise Salvo, MD  Patient DOB:  09-27-1945  MRN:  [id number]  Room:  C13/C13-A      History of Presenting Illness     Chief complaint: Knee Pain    HPI/ROS is limited by: none  HPI/ROS given by: patient and family    Location: knee  Duration: today  Severity: mild    Roger Kettles is a 67 y.o. male who presents with sudden onset of left knee pain around 10AM. He got up from laying down and he couldn't straighten his leg without having a catching sensation. This continued to worsen throughout the day and now he is having difficulty walking. Pain is located behind his knee. When he moves his foot the wrong way or bends his knee, he has sharp pain behind his knee. He has pain in his thigh when he tries to left his leg. He has not noticed any swelling. He denies recent injury. He has never had knee problems in the past. He has chronic back pain. He has never had a blood clot in the past. He takes aspirin but no other blood thinners. He has never had gout. He had prostate cancer 5 years ago. He has his PSA checked every 3 months. He was admitted on 03/22/13 with cholecystitis and biliary pancreatitis and had surgery on the 11th by Dr. Reola Calkins. His PCP is Dr. Richardson Landry.       Review of Systems     Review of Systems   Respiratory: Negative for shortness of breath.    Cardiovascular: Negative for chest pain and leg swelling.   Musculoskeletal: Positive for back pain (chronic), gait problem and myalgias. Negative for joint swelling and neck pain.   Skin: Negative for wound.   Neurological: Negative for weakness and numbness.   Psychiatric/Behavioral: Negative for agitation. The patient is not nervous/anxious.         Allergies     Pt  has no known allergies.    Medications     Current Outpatient Rx   Name  Route  Sig  Dispense  Refill   . ASPIRIN EC 81 MG PO TBEC     Oral    Take by mouth daily.             Marland Kitchen EMBRACE CONTROL LOW VI SOLN                     . EMBRACE BLOOD GLUCOSE MONITOR DEVI                     . EMBRACE BLOOD GLUCOSE TEST VI STRP                     . GLIPIZIDE 10 MG PO TABS                     . JANUVIA 100 MG PO TABS                     . SIMPLE DIAGNOSTICS LANCING DEV MISC                     . LISINOPRIL 2.5 MG PO TABS                     .  METFORMIN HCL 500 MG PO TABS                     . OMEPRAZOLE 40 MG PO CPDR                     . OXYCODONE-ACETAMINOPHEN 5-325 MG PO TABS    Oral    Take 1-2 tablets by mouth every 4 (four) hours as needed.    30 tablet    0     . PHARMACIST CHOICE LANCETS MISC                     . PRAVASTATIN SODIUM 40 MG PO TABS                     . RANITIDINE HCL 150 MG PO TABS                          Past Medical History     Pt has a past medical history of Diabetes mellitus.    Past Surgical History     Pt has past surgical history that includes EGD (03/23/2013) and LAPAROSCOPIC, CHOLECYSTECTOMY, CHOLANGIOGRAM (03/24/2013).    Family History     The family history is not on file.    Social History     Pt reports that he has quit smoking. He does not have any smokeless tobacco history on file. He reports that he does not drink alcohol. His drug history not on file.    Physical Exam     Blood pressure 129/68, pulse 73, temperature 98.2 F (36.8 C), resp. rate 18, height 1.829 m, weight 82.4 kg, SpO2 100.00%.    Physical Exam   Nursing note and vitals reviewed.  Constitutional: He is oriented to person, place, and time. He appears well-developed and well-nourished. No distress.   HENT:   Head: Normocephalic.   Cardiovascular: Normal rate, regular rhythm and normal heart sounds.  Exam reveals no gallop and no friction rub.    No murmur heard.  Pulses:       Dorsalis pedis pulses are 2+ on the right side, and 2+ on the left side.        Posterior tibial pulses are 2+ on the right side, and 2+ on the left side.   Pulmonary/Chest:  Breath sounds normal. No respiratory distress. He has no wheezes.   Musculoskeletal: He exhibits tenderness. He exhibits no edema.        Left hip: Normal. He exhibits normal range of motion, normal strength, no tenderness and no bony tenderness.        Left knee: He exhibits decreased range of motion. He exhibits no swelling, no effusion, no LCL laxity, no bony tenderness and no MCL laxity. tenderness (posterior) found.        Left lower leg: Normal. He exhibits no tenderness, no bony tenderness, no swelling and no edema.        No left hip pain with internal or external rotation.   Neurological: He is alert and oriented to person, place, and time.   Skin: Skin is warm and dry. No erythema.   Psychiatric: He has a normal mood and affect. Judgment normal.       Orders Placed     Orders Placed This Encounter   Procedures   . US Venous Low Extrem Duplx Dopp Uni Left   . XR KNEE LEFT AP AND  LATERAL       Diagnostic Results       The results of the diagnostic studies below have been reviewed by myself:    Labs  Results     ** No Results found for the last 24 hours. **          Radiologic Studies  Radiology Results (24 Hour)     Procedure Component Value Units Date/Time    US Venous Low Extrem Duplx Dopp Uni Left [[id number]] Collected:04/04/13 2225    Order Status:Completed  Updated:04/04/13 2227    Narrative:    Clinical History:  Left leg pain.  Evaluate for deep venous thrombosis.    Examination:  Two-dimensional grayscale, Doppler spectral waveform analysis and color flow Doppler imaging of the deep veins of the  left lower extremity was performed from groin to calf.    Comparison:  None available.    Findings:  The deep veins are widely patent with normal flow, compressibility, and augmentation.      Impression:    No evidence of deep vein thrombosis in the left leg.    ReadingStation:[id number]    XR KNEE LEFT AP AND LATERAL [[id number]] Collected:04/04/13 2215    Order Status:Completed  Updated:04/04/13 2219     Narrative:    Clinical History:  Left knee pain.  No known injury.    Examination:  AP and lateral views of the left knee.    Comparison:  None available.    Findings:  No fractures are identified.  No significant joint effusion.  Small posterior patellar osteophytes.  There is a separate  ossicle of the tibial tubercle, and there is some overlying soft tissue swelling, which may represent an an injury at  the patellar tendon attachment.      Impression:    No acute bony abnormalities.  Soft tissue swelling over the tibial tubercle, at the patellar tendon attachment site.    ReadingStation:[email or web address]            MDM / Critical Care     Blood pressure 129/68, pulse 73, temperature 98.2 F (36.8 C), resp. rate 18, height 1.829 m, weight 82.4 kg, SpO2 100.00%.    This patient presents to the Emergency Department with pain in an extremity without any obvious history of injury.   Evaluation and treatment for this patient was performed and revealed that there was no serious pathology identified in the ED, and no threat of loss of limb.  Sequelae of their injury were considered in the differential diagnosis including osteoarthritis, rheumatoid arthritis, infected joint, gout, bursitis. Any serious sequelae that would require admission and immediate surgical repair were thought unlikely, and the patient is stable for discharge home.  The diagnostic impression and plan and appropriate follow-up were discussed and agreed upon with the patient and/or family.  If performed the results of lab/radiology tests were reviewed and discussed, wrap was applied, and crutch training performed.  All questions were answered and concerns addressed.  Fall/trauma/sports precautions have been given and the patient was warned to return immediately for worsening symptoms or any acute concerns and to follow up with an orthopaedic specialist within an appropriate time as discussed.      Procedures     None    EKG     None    Diagnosis / Disposition      Clinical Impression  1. Left knee pain    2. Tendonitis of knee, left        Disposition  ED Disposition  Discharge Dorina Hoyer discharge to home/self care.    Condition at disposition: Stable            Prescriptions  New Prescriptions    No medications on file       Attestations     The documentation recorded by my scribe, Ishmael Holter, accurately reflects the services I personally performed and the decisions made by me.  Pulcini Busing Loran Auguste, MD      Annise Boran, Renninger Busing, MD  04/05/13 [phone]

## 2013-04-04 NOTE — Discharge Instructions (Signed)
Knee Pain [Uncertain Cause]    There are several common causes for knee pain. These include a sprain of the ligaments that support the joint; an injury to the cartilage lining of the joint; arthritis from wear-and-tear or inflammation; and other causes. There may also be swelling, reduced movement of the knee joint and pain with walking. A definite diagnosis was not made today. If your symptoms do not improve, further follow-up and testing may be needed.  Home Care:  1. Stay off the injured leg as much as possible until pain improves.  2. Apply an ice pack (ice cubes in a plastic bag, wrapped in a towel) over the injured area for 20 minutes every 1-2 hours the first day. Continue with ice packs 3-4 times a day for the next two days, then as needed for the relief of pain and swelling.  3. You may use acetaminophen (Tylenol) or ibuprofen (Motrin, Advil) to control pain, unless another pain medicine was prescribed. [NOTE: If you have chronic liver or kidney disease or ever had a stomach ulcer or GI bleeding, talk with your doctor before using these medicines.]  4. If CRUTCHES or a walker have been recommended, do not bear full weight on the injured leg until you can do so without pain. Check with your doctor before returning to sports or full work duties.  5. If you have a VELCRO KNEE BRACE:   You may open the splint to apply ice.    You may remove the splint to bathe and sleep, unless told otherwise.   Follow Up  with your doctor within 1-2 weeks or as advised if not improving.  [NOTE: If X-rays were taken, they will be reviewed by a radiologist. You will be notified of any new findings that may affect your care.]  Get Prompt Medical Attention  if any of the following occur:   Toes or foot becomes swollen, cold, blue, numb or tingly   Pain or swelling increases in the knee or calf   Warmth or redness appears over the knee or calf   Other joints become painful   Fever or rash appears   Shortness of breath or  chest pain   Fever of 100.4F (38C) or higher, or as directed by your healthcare provider   2000-2014 [employer], [address], [place] [address]. All rights reserved. This information is not intended as a substitute for professional medical care. Always follow your healthcare professional's instructions.    Tendonitis  A tendon is the thick fibrous cord that joins muscle to bone and causes joints to move. Tendonitis is inflammation of the tendon which may be due to overuse, injury or infection. This usually involves the shoulders, forearm, wrist, hands and foot. Symptoms include local pain, swelling and tenderness to the touch. Movement of the involved joint increases the pain.  Tendonitis requires about 4 to 6 weeks to heal. It is treated by preventing motion of the tendon with a splint or brace and use of anti-inflammatory medicine.  Home Care:   Apply an ice pack (ice cubes in a plastic bag, wrapped in a towel) over the injured area for 20 minutes every 1-2 hours the first day for pain relief. Continue this 3-4 times a day until the pain and swelling goes away.   Rest the inflamed joint and protect it from movement.   You may use ibuprofen (Motrin, Advil) or naproxen (Aleve, Naprosyn) to treat pain and inflammation, unless another medicine was prescribed. If you   can't take these medicines, acetaminophen (Tylenol) may help with the pain, but does not treat inflammation. [NOTE : If you have chronic liver or kidney disease or ever had a stomach ulcer or GI bleeding, talk with your doctor before using these medicines.]   As your symptoms improve, begin gradual motion at the involved joint.  Follow Up With Your Doctor If Not Improving After The First Five Days Of Treatment.  Get Prompt Medical Attention If Any Of The Following Occur:   Redness over the painful area   Increasing pain or swelling at the joint   Fever of 100.4F (38C) or higher, or as directed by your healthcare provider    2000-2014 [employer], [address], [place] [address]. All rights reserved. This information is not intended as a substitute for professional medical care. Always follow your healthcare professional's instructions.

## 2013-05-20 ENCOUNTER — Encounter: Payer: Self-pay | Admitting: Gastroenterology

## 2013-05-24 ENCOUNTER — Encounter: Payer: Self-pay | Admitting: Gastroenterology

## 2013-07-06 ENCOUNTER — Ambulatory Visit (AMBULATORY_SURGERY_CENTER): Payer: Self-pay | Admitting: *Deleted

## 2013-07-06 VITALS — Ht 70.0 in | Wt 191.4 lb

## 2013-07-06 DIAGNOSIS — Z85038 Personal history of other malignant neoplasm of large intestine: Secondary | ICD-10-CM

## 2013-07-06 MED ORDER — MOVIPREP 100 G PO SOLR: ORAL | Status: DC

## 2013-07-06 NOTE — Progress Notes (Signed)
No allergies to eggs or soy. No problems with anesthesia.  Pt given Emmi instructions for colonoscopy  No oxygen use  No diet drug use  

## 2013-07-19 ENCOUNTER — Ambulatory Visit (AMBULATORY_SURGERY_CENTER): Payer: Medicare Other | Admitting: Gastroenterology

## 2013-07-19 ENCOUNTER — Encounter: Payer: Self-pay | Admitting: Gastroenterology

## 2013-07-19 VITALS — BP 162/92 | HR 64 | Temp 96.8°F | Resp 16 | Ht 70.0 in | Wt 191.0 lb

## 2013-07-19 DIAGNOSIS — D126 Benign neoplasm of colon, unspecified: Secondary | ICD-10-CM

## 2013-07-19 DIAGNOSIS — Z85038 Personal history of other malignant neoplasm of large intestine: Secondary | ICD-10-CM

## 2013-07-19 DIAGNOSIS — K5289 Other specified noninfective gastroenteritis and colitis: Secondary | ICD-10-CM

## 2013-07-19 MED ORDER — SODIUM CHLORIDE 0.9 % IV SOLN: 500.0000 mL | INTRAVENOUS | Status: DC

## 2013-07-19 NOTE — Patient Instructions (Signed)
YOU HAD AN ENDOSCOPIC PROCEDURE TODAY AT THE [facility]: Refer to the procedure report that was given to you for any specific questions about what was found during the examination.  If the procedure report does not answer your questions, please call your gastroenterologist to clarify.  If you requested that your care partner not be given the details of your procedure findings, then the procedure report has been included in a sealed envelope for you to review at your convenience later.  YOU SHOULD EXPECT: Some feelings of bloating in the abdomen. Passage of more gas than usual.  Walking can help get rid of the air that was put into your GI tract during the procedure and reduce the bloating. If you had a lower endoscopy (such as a colonoscopy or flexible sigmoidoscopy) you may notice spotting of blood in your stool or on the toilet paper. If you underwent a bowel prep for your procedure, then you may not have a normal bowel movement for a few days.  DIET: Your first meal following the procedure should be a light meal and then it is ok to progress to your normal diet.  A half-sandwich or bowl of soup is an example of a good first meal.  Heavy or fried foods are harder to digest and may make you feel nauseous or bloated.  Likewise meals heavy in dairy and vegetables can cause extra gas to form and this can also increase the bloating.  Drink plenty of fluids but you should avoid alcoholic beverages for 24 hours.  ACTIVITY: Your care partner should take you home directly after the procedure.  You should plan to take it easy, moving slowly for the rest of the day.  You can resume normal activity the day after the procedure however you should NOT DRIVE or use heavy machinery for 24 hours (because of the sedation medicines used during the test).    SYMPTOMS TO REPORT IMMEDIATELY: A gastroenterologist can be reached at any hour.  During normal business hours, 8:30 AM to 5:00 PM Monday through Friday,  call [phone].  After hours and on weekends, please call the GI answering service at [phone] who will take a message and have the physician on call contact you.   Following lower endoscopy (colonoscopy or flexible sigmoidoscopy):  Excessive amounts of blood in the stool  Significant tenderness or worsening of abdominal pains  Swelling of the abdomen that is new, acute  Fever of 100F or higher  FOLLOW UP: If any biopsies were taken you will be contacted by phone or by letter within the next 1-3 weeks.  Call your gastroenterologist if you have not heard about the biopsies in 3 weeks.  Our staff will call the home number listed on your records the next business day following your procedure to check on you and address any questions or concerns that you may have at that time regarding the information given to you following your procedure. This is a courtesy call and so if there is no answer at the home number and we have not heard from you through the emergency physician on call, we will assume that you have returned to your regular daily activities without incident.  SIGNATURES/CONFIDENTIALITY: You and/or your care partner have signed paperwork which will be entered into your electronic medical record.  These signatures attest to the fact that that the information above on your After Visit Summary has been reviewed and is understood.  Full responsibility of the confidentiality of this   discharge information lies with you and/or your care-partner.  Polyps removed and sent to pathology.  Await pathology results.  Recommend repeat colonoscopy in 3 years.

## 2013-07-19 NOTE — Progress Notes (Signed)
Report to PACU, RN, vss, BBS= Clear.  

## 2013-07-19 NOTE — Op Note (Signed)
[facility]  [address]. [place], [id number]   COLONOSCOPY PROCEDURE REPORT PATIENT: Ricky Conley, Ricky Conley  MR#: [id number] BIRTHDATE: 1945-04-10 , 68  yrs. old GENDER: Male ENDOSCOPIST: Ladene Artist, MD, [facility] PROCEDURE DATE:  07/19/2013 PROCEDURE:   Colonoscopy via colostomy with biopsy and snare polypectomy First Screening Colonoscopy - Avg.  risk and is 50 yrs.  old or older - No.  Prior Negative Screening - Now for repeat screening. N/A  History of Adenoma - Now for follow-up colonoscopy & has been > or = to 3 yrs.  N/A  Polyps Removed Today? Yes. ASA CLASS:   Class II INDICATIONS:High risk patient with personal history of rectal cancer. MEDICATIONS: MAC sedation, administered by CRNA and propofol (Diprivan) 200mg  IV DESCRIPTION OF PROCEDURE:   After the risks benefits and alternatives of the procedure were thoroughly explained, informed consent was obtained.  A digital exam of the colostomy was performed.   The LB [id number]  endoscope was introduced through the colostomy and advanced to the cecum, which was identified by both the appendix and ileocecal valve. No adverse events experienced.   The quality of the prep was excellent, using MoviPrep  The instrument was then slowly withdrawn as the colon was fully examined.  COLON FINDINGS: A sessile polyp measuring 7 mm in size was found in the ascending colon.  A polypectomy was performed with a cold snare.  The resection was complete and the polyp tissue was completely retrieved.   A sessile polyp measuring 3 mm in size was found in the transverse colon.  A polypectomy was performed with cold forceps.  The resection was complete and the polyp tissue was completely retrieved.   Small nodule measuring 5 mm was found at the colostomy.  Multiple biopsies of the lesion were performed. The colon was otherwise normal.  There was no diverticulosis, inflammation, polyps or cancers unless previously  stated. Retroflexion was not performed due to colostomy. The time to cecum=2 minutes 15 seconds.  Withdrawal time=8 minutes 55 seconds. The scope was withdrawn and the procedure completed.  COMPLICATIONS: There were no complications.  ENDOSCOPIC IMPRESSION: 1.   Sessile polyp measuring 7 mm in the ascending colon; polypectomy performed with a cold snare 2.   Sessile polyp measuring 3 mm in the transverse colon; polypectomy performed with cold forceps 3.   Small nodule at the colostomy; multiple biopsies performed  RECOMMENDATIONS: 1.  Await pathology results 2.  Repeat Colonoscopy in 3 years  eSigned:  Ladene Artist, MD, [facility] 07/19/2013 8:34 AM   cc: Azalia Bilis, MD

## 2013-07-19 NOTE — Progress Notes (Signed)
Called to room to assist during endoscopic procedure.  Patient ID and intended procedure confirmed with present staff. Received instructions for my participation in the procedure from the performing physician.  

## 2013-07-20 ENCOUNTER — Telehealth: Payer: Self-pay | Admitting: *Deleted

## 2013-07-20 NOTE — Telephone Encounter (Signed)
  Follow up Call-  Call back number 07/19/2013  Post procedure Call Back phone  # [phone]  Permission to leave phone message Yes     Patient questions:  Do you have a fever, pain , or abdominal swelling? no Pain Score  0 *  Have you tolerated food without any problems? yes  Have you been able to return to your normal activities? yes  Do you have any questions about your discharge instructions: Diet   no Medications  no Follow up visit  no  Do you have questions or concerns about your Care? no  Actions: * If pain score is 4 or above: No action needed, pain <4.

## 2013-07-22 ENCOUNTER — Encounter: Payer: Self-pay | Admitting: Gastroenterology

## 2013-11-01 ENCOUNTER — Other Ambulatory Visit: Payer: Self-pay | Admitting: Nurse Practitioner

## 2013-11-01 DIAGNOSIS — K861 Other chronic pancreatitis: Secondary | ICD-10-CM

## 2013-11-01 DIAGNOSIS — R9389 Abnormal findings on diagnostic imaging of other specified body structures: Secondary | ICD-10-CM

## 2013-11-16 ENCOUNTER — Ambulatory Visit
Admission: RE | Admit: 2013-11-16 | Discharge: 2013-11-16 | Disposition: A | Discharge status: Home / Self Care | Payer: Medicare Other | Source: Ambulatory Visit | Attending: Nurse Practitioner | Admitting: Nurse Practitioner

## 2013-11-16 DIAGNOSIS — R938 Abnormal findings on diagnostic imaging of other specified body structures: Secondary | ICD-10-CM | POA: Insufficient documentation

## 2013-11-16 DIAGNOSIS — R9389 Abnormal findings on diagnostic imaging of other specified body structures: Secondary | ICD-10-CM

## 2013-11-16 DIAGNOSIS — K861 Other chronic pancreatitis: Secondary | ICD-10-CM | POA: Insufficient documentation

## 2013-11-16 MED ORDER — GADOBUTROL 1 MMOL/ML IV SOLN
10.0000 mL | Freq: Once | INTRAVENOUS | Status: AC | PRN
Start: 2013-11-16 — End: 2013-11-16
  Administered 2013-11-16: 10 mmol via INTRAVENOUS

## 2014-03-11 ENCOUNTER — Emergency Department: Payer: Medicare Other

## 2014-03-11 ENCOUNTER — Emergency Department
Admission: EM | Admit: 2014-03-11 | Discharge: 2014-03-11 | Disposition: A | Discharge status: Home / Self Care | Payer: Medicare Other | Attending: Emergency Medicine | Admitting: Emergency Medicine

## 2014-03-11 DIAGNOSIS — W010XXA Fall on same level from slipping, tripping and stumbling without subsequent striking against object, initial encounter: Secondary | ICD-10-CM | POA: Insufficient documentation

## 2014-03-11 DIAGNOSIS — M545 Low back pain: Secondary | ICD-10-CM | POA: Insufficient documentation

## 2014-03-11 DIAGNOSIS — S2231XA Fracture of one rib, right side, initial encounter for closed fracture: Secondary | ICD-10-CM | POA: Insufficient documentation

## 2014-03-11 HISTORY — DX: Gastro-esophageal reflux disease without esophagitis: K21.9

## 2014-03-11 HISTORY — DX: Malignant neoplasm of prostate: C61

## 2014-03-11 MED ORDER — ORPHENADRINE CITRATE ER 100 MG PO TB12
100.0000 mg | ORAL_TABLET | Freq: Two times a day (BID) | ORAL | Status: DC
Start: 2014-03-11 — End: 2015-09-15

## 2014-03-11 MED ORDER — IBUPROFEN 600 MG PO TABS
600.0000 mg | ORAL_TABLET | Freq: Four times a day (QID) | ORAL | Status: DC | PRN
Start: 2014-03-11 — End: 2015-09-15

## 2014-03-11 MED ORDER — OXYCODONE-ACETAMINOPHEN 5-325 MG PO TABS
1.0000 | ORAL_TABLET | Freq: Four times a day (QID) | ORAL | Status: DC | PRN
Start: 2014-03-11 — End: 2015-09-15

## 2014-03-11 NOTE — Discharge Instructions (Signed)
Rib Fracture (Broken Rib)  Your ribs are curved bones in your chest. They help protect your lungs and expand and contract when you breathe. Children's ribs bend easily and can often withstand a blow or fall. But adult ribs are more likely to break (fracture) under stress. Even coughing or a hard sneeze can fracture a rib.    When to Go to the Emergency Room (ER)  Although they can be painful, most rib fractures aren't serious. But they often make it hard to cough or breathe deeply. Get medical care right away if you have:   Trouble breathing.   Nausea, vomiting, or stomach pain with a sore or bruised rib.   Pain that worsens over time.   An injury to the chest or abdomen.  What to Expect in the ER   A doctor will ask about your injury and examine you carefully.   An X-ray of your chest will likely be taken to show any major damage to ribs and lungs. However, ribs can undergo small breaks that do not show up on X-rays, even though they still hurt.   You may be given medication to ease your discomfort.   Rarely, rib fractures can cause a lung to collapse or lead to bleeding in the chest. In these cases, a tube will be inserted into the chest to reinflate the lung or drain the blood.  Follow-up  You are likely to heal in 6 to 8 weeks. Most rib fractures heal on their own with no lasting effects. Call your doctor right away if you notice any of these symptoms:   Increased chest pain   Shortness of breath   Fever   Coughing up blood   2000-2015 [employer] [address], [place] [address]. All rights reserved. This information is not intended as a substitute for professional medical care. Always follow your healthcare professional's instructions.

## 2014-03-11 NOTE — ED Provider Notes (Signed)
[facility]  EMERGENCY DEPARTMENT  History and Physical Exam       Patient Name: Henry Hines, DITULLIO  Encounter Date:  03/11/2014  Physician Assistant: Boykin Peek, PA-C  Attending Physician: Call, Deprey Busing, MD  PCP: Anise Salvo, MD  Patient DOB:  [birth year]  MRN:  [id number]  Room:  E50/E50-A      History of Presenting Illness     Chief complaint: Back Pain    HPI/ROS given by: Patient    Henry Hines is a 69 y.o. male who presents with right posterior thoracic and lumbar pain after fall that occurred 4 days ago. He was standing at the top of an outdoor staircase when he slipped on ice, states his feet slipped out from under him and the right posterior chest wall/lumbar region struck the edge of a step. He did not strike his head. No LOC. No headache, neck pain, chest pain, abdominal pain. Complains of continued right posterior thoracic pain, worsening with deep breaths, coughing, sneezing. Denies shortness of breath.     Review of Systems     Review of Systems   Constitutional: Negative for fever and chills.   Respiratory: Negative for shortness of breath.    Cardiovascular: Positive for chest pain (Posterior chest wall).   Gastrointestinal: Negative for abdominal pain.   Musculoskeletal: Positive for back pain. Negative for neck pain.   Neurological: Negative for speech difficulty.   Psychiatric/Behavioral: Negative for agitation.   All other systems reviewed and are negative.       Allergies & Medications     Pt has No Known Allergies.    Discharge Medication List as of 03/11/2014  5:28 PM      CONTINUE these medications which have NOT CHANGED    Details   aspirin EC 81 MG EC tablet Take by mouth daily., Until Discontinued, Historical Med      Blood Glucose Calibration (EMBRACE CONTROL) LOW Solution Historical Med      Blood Glucose Monitoring Suppl (EMBRACE BLOOD GLUCOSE MONITOR) Device Historical Med      EMBRACE BLOOD GLUCOSE TEST test strip Historical Med      glipiZIDE (GLUCOTROL) 10 MG  tablet Starting 01/18/2013, Until Discontinued, Historical Med      JANUVIA 100 MG tablet Starting 03/09/2013, Until Discontinued, Historical Med      Lancet Devices (SIMPLE DIAGNOSTICS LANCING DEV) Misc Historical Med      metFORMIN (GLUCOPHAGE) 500 MG tablet Starting 12/22/2012, Until Discontinued, Historical Med      omeprazole (PRILOSEC) 40 MG capsule Starting 01/18/2013, Until Discontinued, Historical Med      !! oxyCODONE-acetaminophen (PERCOCET) 5-325 MG per tablet Take 1-2 tablets by mouth every 4 (four) hours as needed., Starting 03/25/2013, Until Discontinued, Print      Pharmacist Choice Lancets Misc Historical Med      pravastatin (PRAVACHOL) 40 MG tablet Starting 01/18/2013, Until Discontinued, Historical Med      ranitidine (ZANTAC) 150 MG tablet Starting 02/15/2013, Until Discontinued, Historical Med       !! - Potential duplicate medications found. Please discuss with provider.           Past Medical History     Pt has a past medical history of Diabetes mellitus; Prostate cancer; and GERD (gastroesophageal reflux disease).     Past Surgical History     Pt  has past surgical history that includes EGD (03/23/2013); LAPAROSCOPIC, CHOLECYSTECTOMY, CHOLANGIOGRAM (03/24/2013); Appendectomy; Cholecystectomy; and Hernia repair.     Family History  The family history is not on file.     Social History     Pt reports that he has quit smoking. He does not have any smokeless tobacco history on file. He reports that he does not drink alcohol or use illicit drugs.     Physical Exam     Blood pressure 128/70, pulse 98, temperature 97.3 F (36.3 C), resp. rate 20, weight 86 kg, SpO2 100 %.    Constitutional: Well developed, well nourished, active, in no apparent distress.  HENT:   Head: Normocephalic, atraumatic  Ears: No external lesions.  Nose: No external lesions. No epistaxis or drainage.  Eyes: PERRL. No scleral icterus. No conjunctival injection. EOMI.  Neck: Trachea is midline. No JVD. Normal range of motion. No  apparent masses.  Cardiovascular: Regular rhythm, S1 normal and S2 normal. No murmur heard.  Pulmonary/Chest: Effort normal. Lungs clear to auscultation bilaterally. Very ttp of R posterolateral chest wall at level of 8th/9th rib.  Abdominal: Soft, non-tender, non-distended. No masses.   Genitourinay/Anorectal: Defferred  Musculoskeletal: Normal range of motion. No deformity or apparent injury.   Neurological: Pt is alert. Cranial nerves are grossly intact. Moving all extremities without apparent deficit.   Psychiatric: Affect is appropriate. There is no agitation.   Skin: Skin is warm, dry, well perfused. No rash noted. No cyanosis. No pallor. No apparent wound.       Diagnostic Results     The results of the diagnostic studies below have been reviewed by myself:    Labs  Results     ** No results found for the last 24 hours. **          Radiologic Studies  Xr Ribs Right W Pa Chest    03/11/2014   No plain film evidence of fracture. No appreciable pneumothorax or pleural effusion.  ReadingStation:[id number]    Xr Thoracic Spine Ap Lateral And Swimmers    03/11/2014   Mild/moderate degenerative change of the thoracic spine. No compression fracture the thoracic spine is seen.  ReadingStation:[id number]    Xr Lumbar Spine Ap And Lateral    03/11/2014   Mild/moderate lumbar spondylosis. No fracture, spondylolisthesis, or spondylolysis is seen.  ReadingStation:[id number]       ED Course and Medical Decision Making     ED Medication Orders     None          Differential dx includes fracture, sprain, dislocation, strain, contusion, abrasion, neurovascular injury.     Bedside US demonstrated occult rib fx. No ptx seen of cxr. No evidence of respiratory embarrassment at this time. Percocet, norflex, nsaids. Incentive spirometer given. Advised to return immediately to the ED for new or worsening symptoms or any other concern.    In addition to the above history, please see nursing notes. Allergies, meds, past medical, family, social  hx, and the results of the diagnostic studies performed have been reviewed by myself.    I discussed this case with the attending physician in the emergency department and they agree with the assessment and treatment plan.     This chart was generated by an EMR and may contain errors or omissions not intended by the user.     Procedures / Critical Care     BEDSIDE ULTRASOUND  By Boykin Peek, PA-C  Indication: suspicion for occult rib fx  Consent: verbal  Bedside US performed over tender area. Revealed obvious step off deformity of R 8th rib posterolaterally.      Diagnosis / Disposition  Clinical Impression  1. Closed fracture of one rib of right side, initial encounter        Disposition  ED Disposition     Discharge Dorina Hoyer discharge to home/self care.    Condition at disposition: Stable            Follow up for Discharged Patients  [facility] Emergency Department  [address]  [place] [address]  [phone]    As needed, If symptoms worsen      Prescriptions for Discharged Patients  Discharge Medication List as of 03/11/2014  5:28 PM      START taking these medications    Details   ibuprofen (ADVIL,MOTRIN) 600 MG tablet Take 1 tablet (600 mg total) by mouth every 6 (six) hours as needed for Pain., Starting 03/11/2014, Until Discontinued, Print      orphenadrine (NORFLEX) 100 MG tablet Take 1 tablet (100 mg total) by mouth 2 (two) times daily., Starting 03/11/2014, Until Discontinued, Normal      !! oxyCODONE-acetaminophen (PERCOCET) 5-325 MG per tablet Take 1-2 tablets by mouth every 6 (six) hours as needed for Pain., Starting 03/11/2014, Until Discontinued, Print       !! - Potential duplicate medications found. Please discuss with provider.                 Boykin Peek, [place]  03/12/14 5366    Call, Visconti Busing, MD  03/17/14 [phone]

## 2014-06-06 ENCOUNTER — Telehealth: Payer: Self-pay

## 2014-06-06 NOTE — Telephone Encounter (Signed)
Left v.m. Asking pt to call [facility] to schedule assessment appt.  Referral from Dr. Richardson Landry.  Phone number provided.

## 2014-06-28 ENCOUNTER — Telehealth: Payer: Self-pay

## 2014-06-28 NOTE — Telephone Encounter (Signed)
Phone call to schedule for assessment appointment to start diabetes education. Message left with callback name and number.

## 2014-07-22 ENCOUNTER — Emergency Department
Admission: EM | Admit: 2014-07-22 | Discharge: 2014-07-22 | Disposition: A | Discharge status: Home / Self Care | Payer: Medicare Other | Attending: Emergency Medicine | Admitting: Emergency Medicine

## 2014-07-22 ENCOUNTER — Emergency Department: Payer: Medicare Other

## 2014-07-22 DIAGNOSIS — R51 Headache: Secondary | ICD-10-CM | POA: Insufficient documentation

## 2014-07-22 DIAGNOSIS — S39012A Strain of muscle, fascia and tendon of lower back, initial encounter: Secondary | ICD-10-CM | POA: Insufficient documentation

## 2014-07-22 DIAGNOSIS — X58XXXA Exposure to other specified factors, initial encounter: Secondary | ICD-10-CM | POA: Insufficient documentation

## 2014-07-22 DIAGNOSIS — Y9389 Activity, other specified: Secondary | ICD-10-CM | POA: Insufficient documentation

## 2014-07-22 MED ORDER — HYDROCODONE-ACETAMINOPHEN 5-325 MG PO TABS
1.0000 | ORAL_TABLET | Freq: Four times a day (QID) | ORAL | Status: DC | PRN
Start: 2014-07-22 — End: 2015-09-15

## 2014-07-22 MED ORDER — DEXAMETHASONE 4 MG PO TABS
12.0000 mg | ORAL_TABLET | Freq: Once | ORAL | Status: AC
Start: 2014-07-22 — End: 2014-07-22
  Administered 2014-07-22: 12 mg via ORAL

## 2014-07-22 MED ORDER — DEXAMETHASONE 4 MG PO TABS
ORAL_TABLET | ORAL | Status: AC
Start: 2014-07-22 — End: ?
  Filled 2014-07-22: qty 3

## 2014-07-22 MED ORDER — PREDNISONE 20 MG PO TABS
60.0000 mg | ORAL_TABLET | Freq: Every day | ORAL | Status: DC
Start: 2014-07-22 — End: 2015-09-15

## 2014-07-22 NOTE — Discharge Instructions (Signed)
Back Sprain or Strain    You have injured the muscles (strain) or ligaments (sprain) around the spine. This may occur after a sudden forceful twisting or bending force (such as in a car accident), after a simple awkward movement, or after lifting something heavy with poor body positioning. In either case, muscle spasm is often present and adds to the pain.  A back sprain or muscle strain usually gets better in 1-2 weeks. Unless you had a forceful physical injury (for example, a car accident or fall), X-rays are usually not ordered for the initial evaluation of a back sprain or strain. If pain continues and does not respond to medical treatment, X-rays and other tests may be performed at a later time.  Home care  The following guidelines will help you care for your injury at home:   You may need to stay in bed the first few days. But, as soon as possible, begin sitting or walking to avoid problems with prolonged bed rest (muscle weakness, worsening back stiffness and pain, blood clots in the legs).   When in bed, try to find a position of comfort. A firm mattress is best. Try lying flat on your back with pillows under your knees. You can also try lying on your side with your knees bent up towards your chest and a pillow between your knees.   Avoid prolonged sitting. This puts more stress on the lower back than standing or walking.   During the first two days after injury, apply anice packto the painful area for 20 minutes every 2-4 hours. This will reduce swelling and pain.Heat(hot shower, hot bath or heating pad) works well for muscle spasm. You can start with ice, then switch to heat after two days. Some patients feel best alternating ice and heat treatments. Use the one method that feels the best to you.   You may use acetaminophen or ibuprofen to control pain, unless another pain medicine was prescribed. If you have chronic liver or kidney disease or ever had a stomach ulcer or GI bleeding, talk with  your doctor before using these medicines.   Be aware of safe lifting methods and do not lift anything over 15 pounds until all the pain is gone.  Follow-up care  Follow up with your doctor or this facility as advised. Physical therapy or further tests may be needed if symptoms worsen.  If you had X-rays today, they didn't show any broken bones, breaks, or fractures. Sometimes fractures don't show up on the first X-ray. Bruises and sprains can sometimes hurt as much as a fracture. These injuries can take time to heal completely. If your symptoms don't improve or they get worse, talk with your doctor. You may need a repeat X-ray.  When to seek medical advice  Call your health care provider right awayif any of these occur:   Pain becomes worse or spreads to your arms or legs   Weakness or numbness in one or both arms or legs   Loss of bowel or bladder control   Numbness in the groin or genital area   2000-2015 [employer] [address], [place] [address]. All rights reserved. This information is not intended as a substitute for professional medical care. Always follow your healthcare professional's instructions.

## 2014-07-22 NOTE — ED Notes (Signed)
Pt presents to ED with pain in lower lumbar that radiates down and around to lower R abdominal quadrant. Pt denies, nausea, vomiting, and changes in bowel and bladder habits. Pt states, "it's worse when I twist to the right side. It gets better with rest."

## 2014-07-22 NOTE — ED Provider Notes (Signed)
Physician/Midlevel provider first contact with patient: 07/22/14 1503           [facility] EMERGENCY DEPARTMENT  History and Physical Exam          Patient: Henry Hines  Encounter Date: 07/22/2014    DOB: 1945/09/07  Age/Sex: 69 y.o. male    MRN: [id number]  Room: E51/E51-A    PCP: Anise Salvo, MD  Attending: Magdalene Molly, MD        H&P (loc / qual / severity / dura / tim) ROS       Henry Hines is a 69 y.o. male who presents with chief complaint of back pain. Location right lumbar quality Sharp, severity moderate, duration over 1 week.    Pt says he has a hx of low back pain that becomes aggravating at different times. Pt recently saw his PCP and was prescribed some Flexeril and tramadol. Pt says tramadol did not sit well with him and so he has just continued with the Flexeril. This morning the pain seemed worse and so he presented here for further evaluation. Pt has little bit of a headache but otherwise no weakness in his legs, fever, burning when he pees.           HPI/ROS limits: none  Hx given by: patient    Review of Systems   Constitutional: Negative for fever.   Respiratory: Negative for cough and shortness of breath.    Cardiovascular: Negative for chest pain.   Gastrointestinal: Negative for nausea, vomiting, abdominal pain and diarrhea.   Genitourinary: Negative for dysuria.   Skin: Negative for rash.   Neurological: Positive for headaches.   All other systems reviewed and are negative.           ALL / MEDS / PMH / PSH / PFH / SH     Pt has No Known Allergies.    Previous Medications    ASPIRIN EC 81 MG EC TABLET    Take by mouth daily.    BLOOD GLUCOSE CALIBRATION (EMBRACE CONTROL) LOW SOLUTION        BLOOD GLUCOSE MONITORING SUPPL (EMBRACE BLOOD GLUCOSE MONITOR) DEVICE        CYCLOBENZAPRINE (FLEXERIL) 10 MG TABLET    Take 10 mg by mouth 3 (three) times daily as needed for Muscle spasms.    EMBRACE BLOOD GLUCOSE TEST TEST STRIP        GLIPIZIDE (GLUCOTROL) 10 MG TABLET        IBUPROFEN  (ADVIL,MOTRIN) 600 MG TABLET    Take 1 tablet (600 mg total) by mouth every 6 (six) hours as needed for Pain.    JANUVIA 100 MG TABLET        LANCET DEVICES (SIMPLE DIAGNOSTICS LANCING DEV) MISC        METFORMIN (GLUCOPHAGE) 500 MG TABLET        OMEPRAZOLE (PRILOSEC) 40 MG CAPSULE        ORPHENADRINE (NORFLEX) 100 MG TABLET    Take 1 tablet (100 mg total) by mouth 2 (two) times daily.    OXYCODONE-ACETAMINOPHEN (PERCOCET) 5-325 MG PER TABLET    Take 1-2 tablets by mouth every 4 (four) hours as needed.    OXYCODONE-ACETAMINOPHEN (PERCOCET) 5-325 MG PER TABLET    Take 1-2 tablets by mouth every 6 (six) hours as needed for Pain.    PHARMACIST CHOICE LANCETS MISC        PRAVASTATIN (PRAVACHOL) 40 MG TABLET    80 mg.  RANITIDINE (ZANTAC) 150 MG TABLET        TRAMADOL (ULTRAM) 50 MG TABLET    Take 50 mg by mouth every 6 (six) hours as needed for Pain.       Past Medical History   Diagnosis Date   . Diabetes mellitus    . Prostate cancer    . GERD (gastroesophageal reflux disease)        Past Surgical History   Procedure Laterality Date   . Egd  03/23/2013     Procedure: EGD;  Surgeon: Jayme Cloud, MD;  Location: [facility];  Service: Gastroenterology;  Laterality: N/A;   . Laparoscopic, cholecystectomy, cholangiogram  03/24/2013     Procedure: LAPAROSCOPIC, CHOLECYSTECTOMY, CHOLANGIOGRAM;  Surgeon: Leory Plowman, MD;  Location: [facility];  Service: General;  Laterality: N/A;  LAP CHOLE    ROOM 516     . Appendectomy     . Cholecystectomy     . Hernia repair         The family history is not on file.    Pt reports that he has quit smoking. He does not have any smokeless tobacco history on file. He reports that he does not drink alcohol or use illicit drugs.         Physical Exam       Constitutional: Patient appears comfortable.   Eyes: Pupils equal. EOMI.   ENMT: NL appearance of ears/nose. MMM.   Respiratory: Lungs are clear bilaterally. No work of breathing.   Cardiovascular: Heart regular rate and  rhythm. No leg edema bilaterally.   GI/GU: Abdomen is soft, nontender; no guarding or rigidity.   Skin: Skin is warm and dry.   Neurologic: Awake and alert. Memory intact. Reflexes equal. Extensor hallicus longus 5/5 strength bilaterally. Hip flexion, knee extension, ankle dorsiflexion and plantarflexion 5/5 strength.   Psychiatric: Normal affect and insight; no agitation.   Musculoskeletal: Neck is supple. Trachea is midline. No lumbar TTP or current spasm.     Blood pressure 143/69, temperature 97.7 F (36.5 C), resp. rate 18, weight 82.4 kg, SpO2 99 %.         Labs and Imaging     Labs  Labs Reviewed - No data to display    Radiologic Studies  No results found.    ED Medication Orders     Start Ordered     Status Ordering Provider    07/22/14 1537 07/22/14 1536  dexamethasone (DECADRON) tablet 12 mg   Once in ED     Route: Oral  Ordered Dose: 12 mg     Ordered Kylan Veach B            EKG: none         Procedures & Critical Care / MDM     No procedures or critical care.    The differential diagnosis includes, but is not limited to FX, SCIATICA, MUSCULOSKELETAL, HNP, SPINAL STENOSIS, AAA, KIDNEY STONE, TUMOR, CORD COMPRESSION (i.e. IVC thrombosis), CAUDA EQUINA SYNDROME, INFECTION (i.e. epidural abscess, pyelonephritis), MALIGNANCY    3:40 PM  Pt has no midline TTP along his spine. Pt does have a little bit of difficulty when ambulating but only intermittent. I will arrange for an outpatient MRI and prescribed the pt for some additional meds to try.      All questions have been answered. Pt is appreciative of care.         Diagnosis       Final diagnoses:  Lumbar strain, initial encounter   Cephalgia      ED Disposition     Discharge Dorina Hoyer discharge to home/self care.    Condition at disposition: Stable            New Prescriptions    HYDROCODONE-ACETAMINOPHEN (NORCO) 5-325 MG PER TABLET    Take 1-2 tablets by mouth every 6 (six) hours as needed for Pain.    PREDNISONE (DELTASONE) 20 MG TABLET     Take 3 tablets (60 mg total) by mouth daily. Take 60 mg PO x 7 days, then 40 mg PO x 1 day, then 20 mg PO x 1 day       In addition to the above history, please see nursing notes.  Allergies, meds, past medical, family, social hx, and the results of the diagnostic studies performed have been reviewed.  This is note has been created using an EMR that may contain additions or subtractions not intended by myself, Magdalene Molly, MD.                 Magdalene Molly, MD  07/22/14 [phone]

## 2014-08-04 ENCOUNTER — Other Ambulatory Visit: Payer: Self-pay | Admitting: *Deleted

## 2014-08-04 DIAGNOSIS — I7121 Aneurysm of the ascending aorta, without rupture: Secondary | ICD-10-CM

## 2014-08-04 DIAGNOSIS — I712 Thoracic aortic aneurysm, without rupture: Secondary | ICD-10-CM

## 2014-08-31 ENCOUNTER — Encounter: Payer: Self-pay | Admitting: Surgery

## 2014-08-31 ENCOUNTER — Other Ambulatory Visit: Payer: Self-pay

## 2015-01-27 ENCOUNTER — Encounter: Payer: Self-pay | Admitting: Gastroenterology

## 2015-02-01 ENCOUNTER — Ambulatory Visit (HOSPITAL_BASED_OUTPATIENT_CLINIC_OR_DEPARTMENT_OTHER): Admit: 2015-02-01 | Payer: Self-pay | Admitting: Gastroenterology

## 2015-02-01 ENCOUNTER — Encounter (HOSPITAL_BASED_OUTPATIENT_CLINIC_OR_DEPARTMENT_OTHER): Payer: Self-pay

## 2015-02-01 SURGERY — DONT USE, USE 1094-COLONOSCOPY, DIAGNOSTIC (SCREENING)
Anesthesia: Conscious Sedation | Site: Anus

## 2015-04-09 ENCOUNTER — Emergency Department (HOSPITAL_COMMUNITY): Payer: PPO

## 2015-04-09 ENCOUNTER — Inpatient Hospital Stay (HOSPITAL_COMMUNITY)
Admission: EM | Admit: 2015-04-09 | Discharge: 2015-04-12 | DRG: 195 | Disposition: A | Discharge status: Home Health | Payer: PPO | Attending: Family Medicine | Admitting: Family Medicine

## 2015-04-09 ENCOUNTER — Encounter (HOSPITAL_COMMUNITY): Payer: Self-pay

## 2015-04-09 DIAGNOSIS — Z85038 Personal history of other malignant neoplasm of large intestine: Secondary | ICD-10-CM | POA: Diagnosis not present

## 2015-04-09 DIAGNOSIS — R404 Transient alteration of awareness: Secondary | ICD-10-CM | POA: Diagnosis not present

## 2015-04-09 DIAGNOSIS — J111 Influenza due to unidentified influenza virus with other respiratory manifestations: Secondary | ICD-10-CM

## 2015-04-09 DIAGNOSIS — I712 Thoracic aortic aneurysm, without rupture: Secondary | ICD-10-CM | POA: Diagnosis present

## 2015-04-09 DIAGNOSIS — D696 Thrombocytopenia, unspecified: Secondary | ICD-10-CM | POA: Diagnosis not present

## 2015-04-09 DIAGNOSIS — R509 Fever, unspecified: Secondary | ICD-10-CM | POA: Diagnosis not present

## 2015-04-09 DIAGNOSIS — E785 Hyperlipidemia, unspecified: Secondary | ICD-10-CM | POA: Diagnosis not present

## 2015-04-09 DIAGNOSIS — R4182 Altered mental status, unspecified: Secondary | ICD-10-CM | POA: Diagnosis present

## 2015-04-09 DIAGNOSIS — J09X2 Influenza due to identified novel influenza A virus with other respiratory manifestations: Principal | ICD-10-CM | POA: Diagnosis present

## 2015-04-09 DIAGNOSIS — Z87891 Personal history of nicotine dependence: Secondary | ICD-10-CM | POA: Diagnosis not present

## 2015-04-09 DIAGNOSIS — Z79899 Other long term (current) drug therapy: Secondary | ICD-10-CM | POA: Diagnosis not present

## 2015-04-09 DIAGNOSIS — R5081 Fever presenting with conditions classified elsewhere: Secondary | ICD-10-CM | POA: Diagnosis not present

## 2015-04-09 DIAGNOSIS — D649 Anemia, unspecified: Secondary | ICD-10-CM | POA: Diagnosis not present

## 2015-04-09 DIAGNOSIS — F0390 Unspecified dementia without behavioral disturbance: Secondary | ICD-10-CM | POA: Diagnosis not present

## 2015-04-09 DIAGNOSIS — I10 Essential (primary) hypertension: Secondary | ICD-10-CM | POA: Diagnosis not present

## 2015-04-09 DIAGNOSIS — Z7982 Long term (current) use of aspirin: Secondary | ICD-10-CM

## 2015-04-09 DIAGNOSIS — F419 Anxiety disorder, unspecified: Secondary | ICD-10-CM | POA: Diagnosis present

## 2015-04-09 DIAGNOSIS — E119 Type 2 diabetes mellitus without complications: Secondary | ICD-10-CM | POA: Diagnosis present

## 2015-04-09 DIAGNOSIS — R531 Weakness: Secondary | ICD-10-CM | POA: Diagnosis not present

## 2015-04-09 DIAGNOSIS — F319 Bipolar disorder, unspecified: Secondary | ICD-10-CM | POA: Diagnosis not present

## 2015-04-09 DIAGNOSIS — Z933 Colostomy status: Secondary | ICD-10-CM | POA: Diagnosis not present

## 2015-04-09 DIAGNOSIS — R05 Cough: Secondary | ICD-10-CM

## 2015-04-09 DIAGNOSIS — Z809 Family history of malignant neoplasm, unspecified: Secondary | ICD-10-CM

## 2015-04-09 DIAGNOSIS — R059 Cough, unspecified: Secondary | ICD-10-CM

## 2015-04-09 DIAGNOSIS — R69 Illness, unspecified: Secondary | ICD-10-CM

## 2015-04-09 DIAGNOSIS — R410 Disorientation, unspecified: Secondary | ICD-10-CM | POA: Diagnosis present

## 2015-04-09 LAB — CBC WITH DIFFERENTIAL/PLATELET
BASOS ABS: 0 10*3/uL (ref 0.0–0.1)
BASOS PCT: 0 %
Eosinophils Absolute: 0 10*3/uL (ref 0.0–0.7)
Eosinophils Relative: 0 %
HEMATOCRIT: 32.4 % — AB (ref 39.0–52.0)
HEMOGLOBIN: 10.9 g/dL — AB (ref 13.0–17.0)
Lymphocytes Relative: 8 %
Lymphs Abs: 0.4 10*3/uL — ABNORMAL LOW (ref 0.7–4.0)
MCH: 31.4 pg (ref 26.0–34.0)
MCHC: 33.6 g/dL (ref 30.0–36.0)
MCV: 93.4 fL (ref 78.0–100.0)
Monocytes Absolute: 0.5 10*3/uL (ref 0.1–1.0)
Monocytes Relative: 12 %
NEUTROS ABS: 3.5 10*3/uL (ref 1.7–7.7)
NEUTROS PCT: 80 %
Platelets: 99 10*3/uL — ABNORMAL LOW (ref 150–400)
RBC: 3.47 MIL/uL — AB (ref 4.22–5.81)
RDW: 14.5 % (ref 11.5–15.5)
WBC: 4.4 10*3/uL (ref 4.0–10.5)

## 2015-04-09 LAB — INFLUENZA PANEL BY PCR (TYPE A & B)
H1N1FLUPCR: NOT DETECTED
INFLBPCR: NEGATIVE
Influenza A By PCR: POSITIVE — AB

## 2015-04-09 LAB — COMPREHENSIVE METABOLIC PANEL
ALK PHOS: 252 U/L — AB (ref 38–126)
ALT: 46 U/L (ref 17–63)
ANION GAP: 6 (ref 5–15)
AST: 59 U/L — ABNORMAL HIGH (ref 15–41)
Albumin: 2.5 g/dL — ABNORMAL LOW (ref 3.5–5.0)
BILIRUBIN TOTAL: 1.1 mg/dL (ref 0.3–1.2)
BUN: 9 mg/dL (ref 6–20)
CALCIUM: 7.6 mg/dL — AB (ref 8.9–10.3)
CO2: 25 mmol/L (ref 22–32)
Chloride: 111 mmol/L (ref 101–111)
Creatinine, Ser: 0.95 mg/dL (ref 0.61–1.24)
GFR calc non Af Amer: >60 mL/min (ref >60)
Glucose, Bld: 116 mg/dL — ABNORMAL HIGH (ref 65–99)
Potassium: 3.8 mmol/L (ref 3.5–5.1)
SODIUM: 142 mmol/L (ref 135–145)
TOTAL PROTEIN: 5.5 g/dL — AB (ref 6.5–8.1)

## 2015-04-09 LAB — URINALYSIS, ROUTINE W REFLEX MICROSCOPIC
BILIRUBIN URINE: NEGATIVE
GLUCOSE, UA: NEGATIVE mg/dL
HGB URINE DIPSTICK: NEGATIVE
Ketones, ur: NEGATIVE mg/dL
Leukocytes, UA: NEGATIVE
Nitrite: NEGATIVE
PH: 6.5 (ref 5.0–8.0)
Protein, ur: NEGATIVE mg/dL
SPECIFIC GRAVITY, URINE: 1.008 (ref 1.005–1.030)

## 2015-04-09 LAB — PROTIME-INR
INR: 1.1 (ref 0.00–1.49)
Prothrombin Time: 14.4 seconds (ref 11.6–15.2)

## 2015-04-09 LAB — CBG MONITORING, ED: Glucose-Capillary: 118 mg/dL — ABNORMAL HIGH (ref 65–99)

## 2015-04-09 LAB — I-STAT CG4 LACTIC ACID, ED: Lactic Acid, Venous: 1.43 mmol/L (ref 0.5–2.0)

## 2015-04-09 MED ORDER — OSELTAMIVIR PHOSPHATE 75 MG PO CAPS
75.0000 mg | ORAL_CAPSULE | Freq: Two times a day (BID) | ORAL | Status: DC
  Administered 2015-04-10 – 2015-04-12 (×6): 75 mg via ORAL
  Filled 2015-04-09 (×7): qty 1

## 2015-04-09 MED ORDER — SODIUM CHLORIDE 0.9 % IV BOLUS (SEPSIS)
500.0000 mL | INTRAVENOUS | Status: AC
  Administered 2015-04-09: 500 mL via INTRAVENOUS

## 2015-04-09 MED ORDER — BUPROPION HCL ER (XL) 300 MG PO TB24
300.0000 mg | ORAL_TABLET | Freq: Every day | ORAL | Status: DC
  Administered 2015-04-10 – 2015-04-12 (×3): 300 mg via ORAL
  Filled 2015-04-09 (×3): qty 1

## 2015-04-09 MED ORDER — GABAPENTIN 300 MG PO CAPS: 300.0000 mg | ORAL_CAPSULE | ORAL | Status: DC

## 2015-04-09 MED ORDER — DIVALPROEX SODIUM ER 500 MG PO TB24
1500.0000 mg | ORAL_TABLET | Freq: Every morning | ORAL | Status: DC
  Administered 2015-04-10 – 2015-04-12 (×3): 1500 mg via ORAL
  Filled 2015-04-09 (×3): qty 3

## 2015-04-09 MED ORDER — SODIUM CHLORIDE 0.9 % IV SOLN
INTRAVENOUS | Status: DC
  Administered 2015-04-10: 01:00:00 via INTRAVENOUS

## 2015-04-09 MED ORDER — CLONAZEPAM 1 MG PO TABS
1.0000 mg | ORAL_TABLET | Freq: Three times a day (TID) | ORAL | Status: DC
  Administered 2015-04-10 – 2015-04-12 (×9): 1 mg via ORAL
  Filled 2015-04-09 (×9): qty 1

## 2015-04-09 MED ORDER — PRAVASTATIN SODIUM 40 MG PO TABS
80.0000 mg | ORAL_TABLET | Freq: Every day | ORAL | Status: DC
Start: 2015-04-09 — End: 2015-04-12
  Administered 2015-04-10 – 2015-04-11 (×3): 80 mg via ORAL
  Filled 2015-04-09 (×3): qty 2

## 2015-04-09 MED ORDER — ASPIRIN EC 81 MG PO TBEC
81.0000 mg | DELAYED_RELEASE_TABLET | ORAL | Status: DC
  Administered 2015-04-10 – 2015-04-12 (×3): 81 mg via ORAL
  Filled 2015-04-09 (×3): qty 1

## 2015-04-09 MED ORDER — VANCOMYCIN HCL IN DEXTROSE 1-5 GM/200ML-% IV SOLN
1000.0000 mg | Freq: Two times a day (BID) | INTRAVENOUS | Status: DC
  Filled 2015-04-09 (×2): qty 200

## 2015-04-09 MED ORDER — VANCOMYCIN HCL 10 G IV SOLR
1500.0000 mg | INTRAVENOUS | Status: AC
  Administered 2015-04-09: 1500 mg via INTRAVENOUS
  Filled 2015-04-09: qty 1500

## 2015-04-09 MED ORDER — SODIUM CHLORIDE 0.9 % IV BOLUS (SEPSIS)
1000.0000 mL | INTRAVENOUS | Status: AC
  Administered 2015-04-09 (×2): 1000 mL via INTRAVENOUS

## 2015-04-09 MED ORDER — MELOXICAM 7.5 MG PO TABS
7.5000 mg | ORAL_TABLET | Freq: Two times a day (BID) | ORAL | Status: DC
  Administered 2015-04-10 – 2015-04-12 (×5): 7.5 mg via ORAL
  Filled 2015-04-09 (×5): qty 1

## 2015-04-09 MED ORDER — ENOXAPARIN SODIUM 40 MG/0.4ML ~~LOC~~ SOLN
40.0000 mg | SUBCUTANEOUS | Status: DC
  Administered 2015-04-10 – 2015-04-12 (×3): 40 mg via SUBCUTANEOUS
  Filled 2015-04-09 (×3): qty 0.4

## 2015-04-09 MED ORDER — PIPERACILLIN-TAZOBACTAM 3.375 G IVPB
3.3750 g | Freq: Three times a day (TID) | INTRAVENOUS | Status: DC
  Administered 2015-04-10: 3.375 g via INTRAVENOUS
  Filled 2015-04-09 (×3): qty 50

## 2015-04-09 MED ORDER — PIPERACILLIN-TAZOBACTAM 3.375 G IVPB 30 MIN
3.3750 g | INTRAVENOUS | Status: AC
  Administered 2015-04-09: 3.375 g via INTRAVENOUS
  Filled 2015-04-09: qty 50

## 2015-04-09 NOTE — Progress Notes (Signed)
Pharmacy Antibiotic Note  Ricky Conley is a 70 y.o. male who presented to the [facility] on 04/09/2015 with fever and AMS concerning for infection. Code sepsis was called and pharmacy was consulted to start Vancomycin + Zosyn. Wt 81.6 kg, SCr 0.95, CrCl~70 ml/min.   Plan: 1. Vancomycin 1500 mg IV x 1 followed by 1g IV every 12 horus 2. Zosyn 3.375g IV x 1 (over 30 minutes) followed by 3.375g IV every 8 hours (infused over 4 hours) 3. Will continue to follow renal function, culture results, LOT, and antibiotic de-escalation plans   Height: 5\' 10"  (177.8 cm) Weight: 180 lb (81.647 kg) IBW/kg (Calculated) : 73  Temp (24hrs), Avg:101.8 F (38.8 C), Min:101.8 F (38.8 C), Max:101.8 F (38.8 C)   Recent Labs Lab 04/09/15 2110 04/09/15 2125  WBC 4.4  --   CREATININE 0.95  --   LATICACIDVEN  --  1.43    Estimated Creatinine Clearance: 73.6 mL/min (by C-G formula based on Cr of 0.95).    No Known Allergies  Antimicrobials this admission: Vanc 2/26 >> Zosyn 2/26 >>  Dose adjustments this admission: N/a  Microbiology results: n/a  Thank you for allowing pharmacy to be a part of this patient's care.  Alycia Rossetti, PharmD, BCPS Clinical Pharmacist Pager: [phone] 04/09/2015 10:25 PM

## 2015-04-09 NOTE — ED Notes (Addendum)
Pt arrived via EMS c/o fever and altered mental status starting today.  EMS gave 1,000mg  tylenol, 645ml NS.  Family gave tylenol at 1800 but unknown amount.  Wife reports oral temp at home 105.0.  Pt has colostomy and diabetes.  Wife reports found pt on floor at home earlier, unwitnessed fall.

## 2015-04-09 NOTE — ED Provider Notes (Signed)
CSN: [id number]     Arrival date & time 04/09/15  1953 History   First MD Initiated Contact with Patient 04/09/15 2006     Chief Complaint  Patient presents with  . Fever  . Altered Mental Status     (Consider location/radiation/quality/duration/timing/severity/associated sxs/prior Treatment) Patient is a 70 y.o. male presenting with fever and altered mental status.  Fever Max temp prior to arrival:  105 Temp source:  Oral Severity:  Moderate Onset quality:  Gradual Duration:  2 days Timing:  Constant Progression:  Waxing and waning Chronicity:  New Relieved by:  Acetaminophen Worsened by:  Nothing tried Ineffective treatments:  None tried Associated symptoms: chills, confusion, cough, myalgias and rhinorrhea   Associated symptoms: no chest pain, no congestion, no diarrhea, no dysuria, no ear pain, no headaches, no nausea, no rash, no sore throat and no vomiting   Risk factors: hx of cancer and sick contacts   Risk factors: no immunosuppression   Altered Mental Status Presenting symptoms: confusion   Associated symptoms: fever   Associated symptoms: no abdominal pain, no headaches, no light-headedness, no nausea, no palpitations, no rash, no vomiting and no weakness     Past Medical History  Diagnosis Date  . Arthritis   . Anxiety   . Depression   . Hyperlipidemia   . Ascending aortic aneurysm ([place])   . Cancer ([place])   . Colon cancer ([place]) 2001  . Bipolar disorder ([place])   . Diet-controlled diabetes mellitus [facility])    Past Surgical History  Procedure Laterality Date  . Colonoscopy    . Back surgery  2003  . Partial colectomy  2001  . Colostomy  2001  . Fracture surgery Left 2009    ankle with hardware  . Carpal tunnel release Bilateral 2008   Family History  Problem Relation Age of Onset  . Cancer Maternal Uncle   . Cancer Brother   . Colon cancer Neg Hx    Social History  Substance Use Topics  . Smoking status: Former Smoker    Quit date: 05/30/1970  .  Smokeless tobacco: Former Systems developer    Quit date: 05/30/1998  . Alcohol Use: No    Review of Systems  Constitutional: Positive for fever and chills. Negative for appetite change and fatigue.  HENT: Positive for rhinorrhea. Negative for congestion, ear pain, facial swelling, mouth sores and sore throat.   Eyes: Negative for visual disturbance.  Respiratory: Positive for cough. Negative for chest tightness and shortness of breath.   Cardiovascular: Negative for chest pain and palpitations.  Gastrointestinal: Negative for nausea, vomiting, abdominal pain, diarrhea and blood in stool.  Endocrine: Negative for cold intolerance and heat intolerance.  Genitourinary: Negative for dysuria, frequency, decreased urine volume and difficulty urinating.  Musculoskeletal: Positive for myalgias. Negative for back pain and neck stiffness.  Skin: Negative for rash.  Neurological: Negative for dizziness, weakness, light-headedness and headaches.  Psychiatric/Behavioral: Positive for confusion.  All other systems reviewed and are negative.     Allergies  Review of patient's allergies indicates no known allergies.  Home Medications   Prior to Admission medications   Medication Sig Start Date End Date Taking? Authorizing Provider  aspirin 81 MG EC tablet Take 81 mg by mouth every morning.    Yes Historical Provider, MD  buPROPion (WELLBUTRIN XL) 300 MG 24 hr tablet Take 300 mg by mouth every morning.   Yes Historical Provider, MD  clonazePAM (KLONOPIN) 2 MG tablet Take 1 mg by mouth 3 (three) times  daily.    Yes Historical Provider, MD  divalproex (DEPAKOTE ER) 500 MG 24 hr tablet Take 1,500 mg by mouth every morning.   Yes Historical Provider, MD  gabapentin (NEURONTIN) 300 MG capsule Take 300-600 mg by mouth See admin instructions. Take 2 capsules in the morning and 1 capsule at noon and 1 capsule at night on Sunday then take 1 capsule three times a day on the other days   Yes Historical Provider, MD   meloxicam (MOBIC) 7.5 MG tablet Take 7.5 mg by mouth 2 (two) times daily after a meal.     Yes Historical Provider, MD  pravastatin (PRAVACHOL) 40 MG tablet Take 80 mg by mouth at bedtime.    Yes Historical Provider, MD   BP 141/63 mmHg  Pulse 81  Temp(Src) 101.8 F (38.8 C) (Oral)  Resp 21  Ht 5\' 10"  (1.778 m)  Wt 81.647 kg  BMI 25.83 kg/m2  SpO2 95% Physical Exam  Constitutional: He is oriented to person, place, and time. He appears well-nourished. No distress.  HENT:  Head: Normocephalic and atraumatic.  Right Ear: External ear normal.  Left Ear: External ear normal.  Eyes: Pupils are equal, round, and reactive to light. Right eye exhibits no discharge. Left eye exhibits no discharge. No scleral icterus.  Neck: Normal range of motion. Neck supple.  Cardiovascular: Normal rate.  Exam reveals no gallop and no friction rub.   No murmur heard. Pulmonary/Chest: Effort normal and breath sounds normal. No stridor. No respiratory distress. He has no wheezes. He has no rales. He exhibits no tenderness.  Abdominal: Soft. He exhibits no distension and no mass. There is no tenderness. There is no rebound and no guarding.  Musculoskeletal: He exhibits no edema or tenderness.  Neurological: He is alert and oriented to person, place, and time. He has normal strength. No cranial nerve deficit or sensory deficit. GCS eye subscore is 4. GCS verbal subscore is 4. GCS motor subscore is 6.  Skin: Skin is warm and dry. No rash noted. He is not diaphoretic. No erythema.    ED Course  Procedures (including critical care time) Labs Review Labs Reviewed  COMPREHENSIVE METABOLIC PANEL - Abnormal; Notable for the following:    Glucose, Bld 116 (*)    Calcium 7.6 (*)    Total Protein 5.5 (*)    Albumin 2.5 (*)    AST 59 (*)    Alkaline Phosphatase 252 (*)    All other components within normal limits  CBC WITH DIFFERENTIAL/PLATELET - Abnormal; Notable for the following:    RBC 3.47 (*)     Hemoglobin 10.9 (*)    HCT 32.4 (*)    Platelets 99 (*)    Lymphs Abs 0.4 (*)    All other components within normal limits  INFLUENZA PANEL BY PCR (TYPE A & B, H1N1) - Abnormal; Notable for the following:    Influenza A By PCR POSITIVE (*)    All other components within normal limits  CBG MONITORING, ED - Abnormal; Notable for the following:    Glucose-Capillary 118 (*)    All other components within normal limits  CULTURE, BLOOD (ROUTINE X 2)  CULTURE, BLOOD (ROUTINE X 2)  URINE CULTURE  URINALYSIS, ROUTINE W REFLEX MICROSCOPIC (NOT AT [facility])  PROTIME-INR  TSH  T4, FREE  I-STAT CG4 LACTIC ACID, ED  I-STAT CG4 LACTIC ACID, ED    Imaging Review Dg Chest 2 View  04/09/2015  CLINICAL DATA:  70 year old male with fever EXAM:  CHEST  2 VIEW COMPARISON:  Chest CT dated 03/08/2013 FINDINGS: Two views of the chest do not demonstrate a focal consolidation. There is no pleural effusion or pneumothorax. There is minimal interstitial prominence throughout the left lung, likely atelectatic changes. Stable cardiac silhouette. Degenerative changes of the spine. No acute osseous pathology identified. IMPRESSION: No active cardiopulmonary disease. Electronically Signed   By: Anner Crete M.D.   On: 04/09/2015 21:14   Ct Head Wo Contrast  04/09/2015  CLINICAL DATA:  Altered mental status and fever beginning today. Found down on floor today, unwitnessed fall. History of diabetes. EXAM: CT HEAD WITHOUT CONTRAST TECHNIQUE: Contiguous axial images were obtained from the base of the skull through the vertex without intravenous contrast. COMPARISON:  CT head February 18, 2013 FINDINGS: Moderate ventriculomegaly on the basis of global parenchymal brain volume loss, stable from prior examination. No intraparenchymal hemorrhage, mass effect nor midline shift. Patchy supratentorial white matter hypodensities are within less than expected for patient's age and though non-specific suggest sequelae of chronic small  vessel ischemic disease. No acute large vascular territory infarcts. No abnormal extra-axial fluid collections. Basal cisterns are patent. Minimal calcific atherosclerosis of the carotid siphons. No skull fracture. The included ocular globes and orbital contents are non-suspicious. The mastoid aircells and included paranasal sinuses are well-aerated. Tube within LEFT external auditory canal. IMPRESSION: No acute intracranial process; negative CT head for age. Electronically Signed   By: Elon Alas M.D.   On: 04/09/2015 22:40   I have personally reviewed and evaluated these images and lab results as part of my medical decision-making.   EKG Interpretation   Date/Time:  Sunday April 09 2015 20:01:47 EST Ventricular Rate:  99 PR Interval:  128 QRS Duration: 81 QT Interval:  307 QTC Calculation: 394 R Axis:   64 Text Interpretation:  Sinus rhythm Probable LVH with secondary repol abnrm  Nonspecific ST abnormality depression in lateral leads more severe then  seeen 02/18/2013 Otherwise no significant change Confirmed by Tyrone Nine MD,  Quillian Quince [phone]) on 04/09/2015 8:09:58 PM      MDM   70 year old male presents with with fever and confusion. History as above. Code sepsis initiated and empiric antibiotics initiated. Patient given 30 mL/kg of IV fluids. Chest x-ray without evidence of pneumonia. Thoracic acid within normal limits. CBC and CMP and nonspecific. Influenza positive. Patient will car admission given his altered mental status and recent falls reported by the wife. ETT head unremarkable for any acute bleed. Next  Patient remained hemodynamically stable and was admitted to the hospitalist service for continued management. Next  Agnostic studies interpreted by me and use to my clinical decision-making.  Patient seen in conjunction with Dr. Tyrone Nine.  Final diagnoses:  Other specified fever  Influenza-like illness  Delirium        Addison Lank, MD 04/09/15 [place],  DO 04/10/15 [id number]

## 2015-04-09 NOTE — Progress Notes (Signed)
Pharmacy Code Sepsis Protocol  Time of code sepsis page: 2103 [x]  Antibiotics delivered at 2111  Were antibiotics ordered at the time of the code sepsis page? No Was it required to contact the physician? []  Physician not contacted [x]  Physician contacted to order antibiotics for code sepsis []  Physician contacted to recommend changing antibiotics  Pharmacy consulted for: Vancomycin + Zosyn  Anti-infectives    Start     Dose/Rate Route Frequency Ordered Stop   04/09/15 2115  vancomycin (VANCOCIN) 1,500 mg in sodium chloride 0.9 % 500 mL IVPB     1,500 mg 250 mL/hr over 120 Minutes Intravenous STAT 04/09/15 2106 04/10/15 2115   04/09/15 2115  piperacillin-tazobactam (ZOSYN) IVPB 3.375 g     3.375 g 100 mL/hr over 30 Minutes Intravenous STAT 04/09/15 2106 04/10/15 2115        Nurse education provided: [x]  Minutes left to administer antibiotics to achieve 1 hour goal [x]  Correct order of antibiotic administration [x]  Antibiotic Y-site compatibilities    Alycia Rossetti, PharmD, BCPS Clinical Pharmacist Pager: [phone] 04/09/2015 9:14 PM

## 2015-04-09 NOTE — H&P (Signed)
[facility] History and Physical  Ricky Conley [id number] DOB: 1945-11-18 DOA: 04/09/2015  Referring physician: EDP PCP: Ricky Frees, MD   Chief Complaint: Fever, AMS   HPI: Ricky Conley is a 70 y.o. male who presents to the ED with 2 day history of fever and waxing and waning AMS.  AMS is described as confusion.  Fever is associated with muscle aches, cough, chills, myalgias, and rhinorrhea.  Wife had been sick to a milder degree previously.  No N/V/D, no chest pain, no SOB, no headache, no meningismus.  Review of Systems: Systems reviewed.  As above, otherwise negative  Past Medical History  Diagnosis Date  . Arthritis   . Anxiety   . Depression   . Hyperlipidemia   . Ascending aortic aneurysm ([place])   . Cancer ([place])   . Colon cancer ([place]) 2001  . Bipolar disorder ([place])   . Diet-controlled diabetes mellitus [facility])    Past Surgical History  Procedure Laterality Date  . Colonoscopy    . Back surgery  2003  . Partial colectomy  2001  . Colostomy  2001  . Fracture surgery Left 2009    ankle with hardware  . Carpal tunnel release Bilateral 2008   Social History:  reports that he quit smoking about 44 years ago. He quit smokeless tobacco use about 16 years ago. He reports that he does not drink alcohol or use illicit drugs.  No Known Allergies  Family History  Problem Relation Age of Onset  . Cancer Maternal Uncle   . Cancer Brother   . Colon cancer Neg Hx      Prior to Admission medications   Medication Sig Start Date End Date Taking? Authorizing Provider  aspirin 81 MG EC tablet Take 81 mg by mouth every morning.    Yes Historical Provider, MD  buPROPion (WELLBUTRIN XL) 300 MG 24 hr tablet Take 300 mg by mouth every morning.   Yes Historical Provider, MD  clonazePAM (KLONOPIN) 2 MG tablet Take 1 mg by mouth 3 (three) times daily.    Yes Historical Provider, MD  divalproex (DEPAKOTE ER) 500 MG 24 hr tablet Take 1,500 mg by mouth every morning.   Yes  Historical Provider, MD  gabapentin (NEURONTIN) 300 MG capsule Take 300-600 mg by mouth See admin instructions. Take 2 capsules in the morning and 1 capsule at noon and 1 capsule at night on Sunday then take 1 capsule three times a day on the other days   Yes Historical Provider, MD  meloxicam (MOBIC) 7.5 MG tablet Take 7.5 mg by mouth 2 (two) times daily after a meal.     Yes Historical Provider, MD  pravastatin (PRAVACHOL) 40 MG tablet Take 80 mg by mouth at bedtime.    Yes Historical Provider, MD   Physical Exam: Filed Vitals:   04/09/15 2245 04/09/15 2300  BP: 127/57 132/61  Pulse: 84 86  Temp:    Resp: 22 13    BP 132/61 mmHg  Pulse 86  Temp(Src) 101.8 F (38.8 C) (Oral)  Resp 13  Ht 5\' 10"  (1.778 m)  Wt 81.647 kg (180 lb)  BMI 25.83 kg/m2  SpO2 94%  General Appearance:    Alert, oriented, no distress, appears stated age  Head:    Normocephalic, atraumatic  Eyes:    PERRL, EOMI, sclera non-icteric        Nose:   Nares without drainage or epistaxis. Mucosa, turbinates normal  Throat:   Moist mucous membranes. Oropharynx without  erythema or exudate.  Neck:   Supple. No carotid bruits.  No thyromegaly.  No lymphadenopathy.   Back:     No CVA tenderness, no spinal tenderness  Lungs:     Clear to auscultation bilaterally, without wheezes, rhonchi or rales  Chest wall:    No tenderness to palpitation  Heart:    Regular rate and rhythm without murmurs, gallops, rubs  Abdomen:     Soft, non-tender, nondistended, normal bowel sounds, no organomegaly  Genitalia:    deferred  Rectal:    deferred  Extremities:   No clubbing, cyanosis or edema.  Pulses:   2+ and symmetric all extremities  Skin:   Skin color, texture, turgor normal, no rashes or lesions  Lymph nodes:   Cervical, supraclavicular, and axillary nodes normal  Neurologic:   CNII-XII intact. Normal strength, sensation and reflexes      throughout    Labs on Admission:  Basic Metabolic Panel:  Recent Labs Lab  04/09/15 2110  NA 142  K 3.8  CL 111  CO2 25  GLUCOSE 116*  BUN 9  CREATININE 0.95  CALCIUM 7.6*   Liver Function Tests:  Recent Labs Lab 04/09/15 2110  AST 59*  ALT 46  ALKPHOS 252*  BILITOT 1.1  PROT 5.5*  ALBUMIN 2.5*   No results for input(s): LIPASE, AMYLASE in the last 168 hours. No results for input(s): AMMONIA in the last 168 hours. CBC:  Recent Labs Lab 04/09/15 2110  WBC 4.4  NEUTROABS 3.5  HGB 10.9*  HCT 32.4*  MCV 93.4  PLT 99*   Cardiac Enzymes: No results for input(s): CKTOTAL, CKMB, CKMBINDEX, TROPONINI in the last 168 hours.  BNP (last 3 results) No results for input(s): PROBNP in the last 8760 hours. CBG:  Recent Labs Lab 04/09/15 2004  GLUCAP 118*    Radiological Exams on Admission: Dg Chest 2 View  04/09/2015  CLINICAL DATA:  70 year old male with fever EXAM: CHEST  2 VIEW COMPARISON:  Chest CT dated 03/08/2013 FINDINGS: Two views of the chest do not demonstrate a focal consolidation. There is no pleural effusion or pneumothorax. There is minimal interstitial prominence throughout the left lung, likely atelectatic changes. Stable cardiac silhouette. Degenerative changes of the spine. No acute osseous pathology identified. IMPRESSION: No active cardiopulmonary disease. Electronically Signed   By: Anner Crete M.D.   On: 04/09/2015 21:14   Ct Head Wo Contrast  04/09/2015  CLINICAL DATA:  Altered mental status and fever beginning today. Found down on floor today, unwitnessed fall. History of diabetes. EXAM: CT HEAD WITHOUT CONTRAST TECHNIQUE: Contiguous axial images were obtained from the base of the skull through the vertex without intravenous contrast. COMPARISON:  CT head February 18, 2013 FINDINGS: Moderate ventriculomegaly on the basis of global parenchymal brain volume loss, stable from prior examination. No intraparenchymal hemorrhage, mass effect nor midline shift. Patchy supratentorial white matter hypodensities are within less than  expected for patient's age and though non-specific suggest sequelae of chronic small vessel ischemic disease. No acute large vascular territory infarcts. No abnormal extra-axial fluid collections. Basal cisterns are patent. Minimal calcific atherosclerosis of the carotid siphons. No skull fracture. The included ocular globes and orbital contents are non-suspicious. The mastoid aircells and included paranasal sinuses are well-aerated. Tube within LEFT external auditory canal. IMPRESSION: No acute intracranial process; negative CT head for age. Electronically Signed   By: Elon Alas M.D.   On: 04/09/2015 22:40    EKG: Independently reviewed.  Assessment/Plan Principal Problem:  Altered mental status Active Problems:   Fever   Influenza-like illness   Delirium   1. ILI with Delirium - 1. Flu pnl pending 2. Empiric tamiflu ordered for now since influenza A virus is at the top of my differential (there is a local outbreak of influenza A in the area over the past week or so). 3. Got zosyn / vanc in ED, will leave these on for the moment but would de-escalate ASAP if his flu comes back positive  Code Status: Full  Family Communication: Family at bedside Disposition Plan: Admit to inpatient   Time spent: 70 min  GARDNER, JARED M. [facility] Pager [phone]  If 7AM-7PM, please contact the day team taking care of the patient [email or web address] Password [email or web address] 04/09/2015, 11:10 PM

## 2015-04-09 NOTE — ED Notes (Signed)
CBG: 118. RN notified. 

## 2015-04-10 ENCOUNTER — Encounter (HOSPITAL_COMMUNITY): Payer: Self-pay | Admitting: *Deleted

## 2015-04-10 DIAGNOSIS — J09X2 Influenza due to identified novel influenza A virus with other respiratory manifestations: Secondary | ICD-10-CM | POA: Diagnosis not present

## 2015-04-10 DIAGNOSIS — D696 Thrombocytopenia, unspecified: Secondary | ICD-10-CM | POA: Diagnosis not present

## 2015-04-10 DIAGNOSIS — R410 Disorientation, unspecified: Secondary | ICD-10-CM | POA: Diagnosis not present

## 2015-04-10 LAB — URINE CULTURE

## 2015-04-10 LAB — TSH: TSH: 1.071 u[IU]/mL (ref 0.350–4.500)

## 2015-04-10 LAB — GLUCOSE, CAPILLARY: GLUCOSE-CAPILLARY: 158 mg/dL — AB (ref 65–99)

## 2015-04-10 LAB — T4, FREE: FREE T4: 0.76 ng/dL (ref 0.61–1.12)

## 2015-04-10 MED ORDER — GABAPENTIN 300 MG PO CAPS
300.0000 mg | ORAL_CAPSULE | ORAL | Status: DC
  Administered 2015-04-10: 300 mg via ORAL
  Filled 2015-04-10 (×3): qty 1

## 2015-04-10 MED ORDER — GABAPENTIN 300 MG PO CAPS: 600.0000 mg | ORAL_CAPSULE | ORAL | Status: DC

## 2015-04-10 MED ORDER — MENTHOL 3 MG MT LOZG
1.0000 | LOZENGE | OROMUCOSAL | Status: DC | PRN
  Administered 2015-04-10: 3 mg via ORAL
  Filled 2015-04-10: qty 9

## 2015-04-10 MED ORDER — SODIUM CHLORIDE 0.9 % IV SOLN
INTRAVENOUS | Status: AC
  Administered 2015-04-10: 11:00:00 via INTRAVENOUS

## 2015-04-10 MED ORDER — GABAPENTIN 300 MG PO CAPS
300.0000 mg | ORAL_CAPSULE | ORAL | Status: DC
  Administered 2015-04-10 – 2015-04-12 (×8): 300 mg via ORAL
  Filled 2015-04-10 (×6): qty 1

## 2015-04-10 NOTE — Progress Notes (Signed)
PROGRESS NOTE    Ricky Conley  [id number]  DOB: 1945-12-27  DOA: 04/09/2015 PCP: Shirline Frees, MD Outpatient Specialists:   Hospital course: 70 year old male with history of diet-controlled DM, HLD, anxiety, depression, colon cancer status post colostomy, presented to [facility] ED on 04/09/15 with 2 day history of fevers, waxing and waning confusion, muscle aches, cough, chills and rhinorrhea. Patient's wife apparently also unwell. Febrile to 101.70F in the ED. Chest x-ray, urine microscopy and CT head unremarkable. Influenza A+.  Assessment & Plan:   Influenza A with URTI - Continue droplet isolation, Tamiflu and symptomatic/supportive treatment. Discontinue empirically started IV vancomycin and Zosyn. - Continue IV fluids for additional 24 hours.  Delirium - Secondary to influenza complicating advanced age. Improved or even resolved.  Anemia and thrombocytopenia - May be related to influenza. Follow CBC in a.m.  Hyperlipidemia - Continue statins  Anxiety and depression - Stable. Continue bupropion, clonazepam and divalproex.  Colon cancer status post colostomy - Said to be in remission.  DVT prophylaxis: Lovenox Code Status: Full Family Communication: None at bedside Disposition Plan: DC home possibly 2/28. PT evaluation requested   Consultants:  None  Procedures:  None  Antimicrobials:  Tamiflu 2/26 >  IV vancomycin and Zosyn-discontinued.   Subjective: Feels better. Complaints of some sore throat. Cough but swallows sputum unable to tell color. No dyspnea or chest pain.  Objective: Filed Vitals:   04/09/15 2345 04/10/15 0015 04/10/15 0545 04/10/15 1005  BP: 115/64 134/73 145/79 151/76  Pulse: 80 76  68  Temp:   98.6 F (37 C) 98.8 F (37.1 C)  TempSrc:   Oral Oral  Resp: 22 18 18 18   Height:  5\' 10"  (1.778 m)    Weight:  78.8 kg (173 lb 11.6 oz)    SpO2: 93% 95% 98% 97%   No intake or output data in the 24 hours ending 04/10/15 1158 Filed  Weights   04/09/15 2008 04/10/15 0015  Weight: 81.647 kg (180 lb) 78.8 kg (173 lb 11.6 oz)    Exam:  General exam: Pleasant elderly male lying comfortably in bed. Does not appear septic or toxic. Respiratory system: Clear. No increased work of breathing. Cardiovascular system: S1 & S2 heard, RRR. No JVD, murmurs, gallops, clicks or pedal edema. Gastrointestinal system: Abdomen is nondistended, soft and nontender. Normal bowel sounds heard. Colostomy with normal brown colored stools. Central nervous system: Alert and oriented. No focal neurological deficits. Extremities: Symmetric 5 x 5 power.   Data Reviewed: Basic Metabolic Panel:  Recent Labs Lab 04/09/15 2110  NA 142  K 3.8  CL 111  CO2 25  GLUCOSE 116*  BUN 9  CREATININE 0.95  CALCIUM 7.6*   Liver Function Tests:  Recent Labs Lab 04/09/15 2110  AST 59*  ALT 46  ALKPHOS 252*  BILITOT 1.1  PROT 5.5*  ALBUMIN 2.5*   No results for input(s): LIPASE, AMYLASE in the last 168 hours. No results for input(s): AMMONIA in the last 168 hours. CBC:  Recent Labs Lab 04/09/15 2110  WBC 4.4  NEUTROABS 3.5  HGB 10.9*  HCT 32.4*  MCV 93.4  PLT 99*   Cardiac Enzymes: No results for input(s): CKTOTAL, CKMB, CKMBINDEX, TROPONINI in the last 168 hours. BNP (last 3 results) No results for input(s): PROBNP in the last 8760 hours. CBG:  Recent Labs Lab 04/09/15 2004  GLUCAP 118*    Recent Results (from the past 240 hour(s))  Urine culture     Status: None (Preliminary  result)   Collection Time: 04/09/15  9:16 PM  Result Value Ref Range Status   Specimen Description URINE, CLEAN CATCH  Final   Special Requests NONE  Final   Culture TOO YOUNG TO READ  Final   Report Status PENDING  Incomplete         Studies: Dg Chest 2 View  04/09/2015  CLINICAL DATA:  70 year old male with fever EXAM: CHEST  2 VIEW COMPARISON:  Chest CT dated 03/08/2013 FINDINGS: Two views of the chest do not demonstrate a focal  consolidation. There is no pleural effusion or pneumothorax. There is minimal interstitial prominence throughout the left lung, likely atelectatic changes. Stable cardiac silhouette. Degenerative changes of the spine. No acute osseous pathology identified. IMPRESSION: No active cardiopulmonary disease. Electronically Signed   By: Anner Crete M.D.   On: 04/09/2015 21:14   Ct Head Wo Contrast  04/09/2015  CLINICAL DATA:  Altered mental status and fever beginning today. Found down on floor today, unwitnessed fall. History of diabetes. EXAM: CT HEAD WITHOUT CONTRAST TECHNIQUE: Contiguous axial images were obtained from the base of the skull through the vertex without intravenous contrast. COMPARISON:  CT head February 18, 2013 FINDINGS: Moderate ventriculomegaly on the basis of global parenchymal brain volume loss, stable from prior examination. No intraparenchymal hemorrhage, mass effect nor midline shift. Patchy supratentorial white matter hypodensities are within less than expected for patient's age and though non-specific suggest sequelae of chronic small vessel ischemic disease. No acute large vascular territory infarcts. No abnormal extra-axial fluid collections. Basal cisterns are patent. Minimal calcific atherosclerosis of the carotid siphons. No skull fracture. The included ocular globes and orbital contents are non-suspicious. The mastoid aircells and included paranasal sinuses are well-aerated. Tube within LEFT external auditory canal. IMPRESSION: No acute intracranial process; negative CT head for age. Electronically Signed   By: Elon Alas M.D.   On: 04/09/2015 22:40        Scheduled Meds: . aspirin EC  81 mg Oral BH-q7a  . buPROPion  300 mg Oral Daily  . clonazePAM  1 mg Oral TID  . divalproex  1,500 mg Oral q morning - 10a  . enoxaparin (LOVENOX) injection  40 mg Subcutaneous Q24H  . gabapentin  300 mg Oral 2 times per day on Sun  . gabapentin  300 mg Oral 3 times per day on Mon  Tue Wed Thu Fri Sat  . [START ON 04/16/2015] gabapentin  600 mg Oral Once per day on Sun  . meloxicam  7.5 mg Oral BID PC  . oseltamivir  75 mg Oral BID  . pravastatin  80 mg Oral QHS   Continuous Infusions: . sodium chloride 75 mL/hr at 04/10/15 1051    Principal Problem:   Altered mental status Active Problems:   Fever   Influenza-like illness   Delirium    Time spent: 25 minutes.    Vernell Leep, MD, FACP, FHM. [facility] Pager [phone]  If 7PM-7AM, please contact night-coverage [email or web address] Password [email or web address] 04/10/2015, 11:58 AM    LOS: 1 day

## 2015-04-10 NOTE — Progress Notes (Signed)
Patient admitted from E.D accompanied by spouse. Patient alert and oriented to place  Self and situation. Welcomed and  Oriented to room and unit.

## 2015-04-10 NOTE — Care Management Note (Signed)
Case Management Note  Patient Details  Name: Ricky Conley MRN: [id number] Date of Birth: 08-30-1945  Subjective/Objective:     Patient admitted with influenza A positive. Patient from home with his wife.   Action/Plan: Awaiting PT/OT recommendations. CM following for discharge disposition.   Expected Discharge Date:  04/13/15               Expected Discharge Plan:     In-House Referral:     Discharge planning Services     Post Acute Care Choice:    Choice offered to:     DME Arranged:    DME Agency:     HH Arranged:    HH Agency:     Status of Service:  In process, will continue to follow  Medicare Important Message Given:    Date Medicare IM Given:    Medicare IM give by:    Date Additional Medicare IM Given:    Additional Medicare Important Message give by:     If discussed at [place] of Stay Meetings, dates discussed:    Additional Comments:  Pollie Friar, RN 04/10/2015, 2:41 PM

## 2015-04-10 NOTE — Evaluation (Signed)
Physical Therapy Evaluation Patient Details Name: Ricky Conley MRN: [id number] DOB: 24-Aug-1945 Today's Date: 04/10/2015   History of Present Illness  pt presents with the flu and delerium.  pt with hx of Anxiety, Depression, AAA, Colon CA, Bipolar, DM, Back Surgery, and Ankle fx.    Clinical Impression  Pt impulsive and with cognitive deficits, though per conversation with wife pt has been having a cognitive decline for more than a year.  Per wife pt has also had multiple falls for some time and has been questioning his mobility and balance.  Lengthy discussion with wife about pt's balance and safety and encouraged wife to f/u with pt's primary MD to discuss cognitive decline and balance deficits.  Feel pt would benefit from 24hr S and f/u with home PT for home safety and continued work on mobility.  Will continue to follow.      Follow Up Recommendations Home health PT;Supervision/Assistance - 24 hour    Equipment Recommendations  Rolling walker with 5" wheels    Recommendations for Other Services       Precautions / Restrictions Precautions Precautions: Fall Precaution Comments: Per wife pt has multiple falls.   Restrictions Weight Bearing Restrictions: No      Mobility  Bed Mobility Overal bed mobility: Needs Assistance Bed Mobility: Supine to Sit;Sit to Supine     Supine to sit: HOB elevated;Min assist Sit to supine: Min guard   General bed mobility comments: pt struggles with coming to sitting and attempts to rock to use momentum to come to sitting.  A to scoot hips to EOB.   Transfers Overall transfer level: Needs assistance Equipment used: Rolling walker (2 wheeled) Transfers: Sit to/from Stand Sit to Stand: Min assist         General transfer comment: pt tends to grab RW and uses momentum to bring himself up to standing.  Ambulation/Gait Ambulation/Gait assistance: Min guard Ambulation Distance (Feet): 140 Feet Assistive device: Rolling walker (2  wheeled) Gait Pattern/deviations: Step-through pattern;Decreased stride length;Trunk flexed     General Gait Details: pt unsteady and needs cues for staying closer to RW.  pt fatigues easily.    Stairs            Wheelchair Mobility    Modified Rankin (Stroke Patients Only)       Balance Overall balance assessment: Needs assistance;History of Falls Sitting-balance support: No upper extremity supported;Feet supported Sitting balance-Leahy Scale: Fair Sitting balance - Comments: pt needed A to scoot hips all the way to the EOB to A with his balance, otherwise he tends to lean posteriorly and will fall backwards.  pt and wife indicate he has been doing this at home too.   Postural control: Posterior lean Standing balance support: Bilateral upper extremity supported;During functional activity Standing balance-Leahy Scale: Poor                               Pertinent Vitals/Pain Pain Assessment: No/denies pain    Home Living Family/patient expects to be discharged to:: Private residence Living Arrangements: Spouse/significant other Available Help at Discharge: Family;Available 24 hours/day (except Thursday and Friday when wife works.) Type of Home: House Home Access: Stairs to enter;Ramped entrance Entrance Stairs-Rails: None Entrance Stairs-Number of Steps: 2 Home Layout: One level Home Equipment: Walker - standard      Prior Function Level of Independence: Needs assistance   Gait / Transfers Assistance Needed: pt normally ambulates without A, but wife  indicates hx of falling.    ADL's / Homemaking Assistance Needed: Wife performs homemaking tasks, but pt can perform own ADLs.          Hand Dominance        Extremity/Trunk Assessment   Upper Extremity Assessment: Generalized weakness           Lower Extremity Assessment: Generalized weakness      Cervical / Trunk Assessment: Kyphotic  Communication   Communication: HOH  Cognition  Arousal/Alertness: Awake/alert Behavior During Therapy: Impulsive Overall Cognitive Status: Impaired/Different from baseline Area of Impairment: Attention;Following commands;Safety/judgement;Awareness;Problem solving;Memory   Current Attention Level: Alternating Memory: Decreased short-term memory;Decreased recall of precautions Following Commands: Follows multi-step commands inconsistently Safety/Judgement: Decreased awareness of deficits;Decreased awareness of safety Awareness: Emergent Problem Solving: Slow processing;Difficulty sequencing;Requires verbal cues;Requires tactile cues General Comments: Per wife pt has been having memory deficits for > 41yr and indicates he gets mroe confused anytime he is sick.      General Comments      Exercises        Assessment/Plan    PT Assessment Patient needs continued PT services  PT Diagnosis Difficulty walking;Generalized weakness;Altered mental status   PT Problem List Decreased strength;Decreased activity tolerance;Decreased balance;Decreased mobility;Decreased coordination;Decreased cognition;Decreased knowledge of use of DME;Decreased safety awareness  PT Treatment Interventions DME instruction;Gait training;Stair training;Functional mobility training;Therapeutic activities;Therapeutic exercise;Balance training;Neuromuscular re-education;Cognitive remediation;Patient/family education   PT Goals (Current goals can be found in the Care Plan section) Acute Rehab PT Goals Patient Stated Goal: Per wife to improve pt's safety. PT Goal Formulation: With patient/family Time For Goal Achievement: 04/24/15 Potential to Achieve Goals: Good    Frequency Min 3X/week   Barriers to discharge        Co-evaluation               End of Session Equipment Utilized During Treatment: Gait belt Activity Tolerance: Patient tolerated treatment well Patient left: in bed;with call bell/phone within reach;with bed alarm set;with family/visitor  present Nurse Communication: Mobility status         Time: [id number] PT Time Calculation (min) (ACUTE ONLY): 39 min   Charges:   PT Evaluation $PT Eval Moderate Complexity: 1 Procedure PT Treatments $Gait Training: 8-22 mins $Therapeutic Activity: 8-22 mins   PT G CodesCatarina Hartshorn, [place] [phone] 04/10/2015, 1:57 PM

## 2015-04-11 ENCOUNTER — Inpatient Hospital Stay (HOSPITAL_COMMUNITY): Payer: PPO

## 2015-04-11 DIAGNOSIS — R509 Fever, unspecified: Secondary | ICD-10-CM | POA: Diagnosis not present

## 2015-04-11 DIAGNOSIS — D696 Thrombocytopenia, unspecified: Secondary | ICD-10-CM | POA: Diagnosis not present

## 2015-04-11 DIAGNOSIS — J09X2 Influenza due to identified novel influenza A virus with other respiratory manifestations: Secondary | ICD-10-CM | POA: Diagnosis not present

## 2015-04-11 DIAGNOSIS — R410 Disorientation, unspecified: Secondary | ICD-10-CM | POA: Diagnosis not present

## 2015-04-11 DIAGNOSIS — R05 Cough: Secondary | ICD-10-CM | POA: Diagnosis not present

## 2015-04-11 LAB — LACTIC ACID, PLASMA: LACTIC ACID, VENOUS: 1.5 mmol/L (ref 0.5–2.0)

## 2015-04-11 LAB — CBC
HCT: 32.4 % — ABNORMAL LOW (ref 39.0–52.0)
Hemoglobin: 10.1 g/dL — ABNORMAL LOW (ref 13.0–17.0)
MCH: 29.7 pg (ref 26.0–34.0)
MCHC: 31.2 g/dL (ref 30.0–36.0)
MCV: 95.3 fL (ref 78.0–100.0)
PLATELETS: 103 10*3/uL — AB (ref 150–400)
RBC: 3.4 MIL/uL — AB (ref 4.22–5.81)
RDW: 14.5 % (ref 11.5–15.5)
WBC: 4.4 10*3/uL (ref 4.0–10.5)

## 2015-04-11 MED ORDER — ACETAMINOPHEN 325 MG PO TABS
650.0000 mg | ORAL_TABLET | Freq: Four times a day (QID) | ORAL | Status: DC | PRN
  Administered 2015-04-11 (×2): 650 mg via ORAL
  Filled 2015-04-11 (×3): qty 2

## 2015-04-11 MED ORDER — GUAIFENESIN-DM 100-10 MG/5ML PO SYRP
5.0000 mL | ORAL_SOLUTION | ORAL | Status: DC | PRN
Start: 2015-04-11 — End: 2015-04-12
  Administered 2015-04-11 – 2015-04-12 (×5): 5 mL via ORAL
  Filled 2015-04-11 (×5): qty 5

## 2015-04-11 MED ORDER — SODIUM CHLORIDE 0.9 % IV BOLUS (SEPSIS)
500.0000 mL | Freq: Once | INTRAVENOUS | Status: AC
  Administered 2015-04-11: 500 mL via INTRAVENOUS

## 2015-04-11 MED ORDER — SODIUM CHLORIDE 0.9 % IV SOLN
INTRAVENOUS | Status: AC
Start: 2015-04-11 — End: 2015-04-12
  Administered 2015-04-11 (×2): via INTRAVENOUS

## 2015-04-11 NOTE — Progress Notes (Signed)
Physical Therapy Treatment Patient Details Name: Ricky Conley MRN: [id number] DOB: 1945/07/05 Today's Date: 04/11/2015    History of Present Illness pt presents with the flu and delerium.  pt with hx of Anxiety, Depression, AAA, Colon CA, Bipolar, DM, Back Surgery, and Ankle fx.      PT Comments    Pt is very impulsive with decreased safety awareness. He is HOH which compounds his safety deficits due to inability to hear cues.  Follow Up Recommendations  Home health PT;Supervision/Assistance - 24 hour     Equipment Recommendations  Rolling walker with 5" wheels    Recommendations for Other Services       Precautions / Restrictions Precautions Precautions: Fall Precaution Comments: Per wife pt has multiple falls.      Mobility  Bed Mobility         Supine to sit: HOB elevated;Min guard     General bed mobility comments: Heavy use of bed rail coming to sit EOB.  Transfers   Equipment used: Rolling walker (2 wheeled)   Sit to Stand: Min guard         General transfer comment: Verbal cues for hand placement and safety.  Ambulation/Gait Ambulation/Gait assistance: Min guard Ambulation Distance (Feet): 50 Feet Assistive device: Rolling walker (2 wheeled) Gait Pattern/deviations: Step-through pattern;Decreased stride length Gait velocity: mildly decreased   General Gait Details: Verbal cues to stay close to RW. Pt fatigues quickly. Gait distance limited by fatigue due to prolonged time sitting EOB and multi sit to stand transfers for ADLs.   Stairs            Wheelchair Mobility    Modified Rankin (Stroke Patients Only)       Balance                                    Cognition Arousal/Alertness: Awake/alert Behavior During Therapy: Impulsive Overall Cognitive Status: Impaired/Different from baseline Area of Impairment: Attention;Following commands;Safety/judgement;Awareness;Problem solving;Memory   Current Attention Level:  Alternating Memory: Decreased short-term memory;Decreased recall of precautions Following Commands: Follows multi-step commands inconsistently Safety/Judgement: Decreased awareness of deficits;Decreased awareness of safety Awareness: Emergent Problem Solving: Slow processing;Difficulty sequencing;Requires verbal cues;Requires tactile cues      Exercises      General Comments        Pertinent Vitals/Pain Pain Assessment: No/denies pain    Home Living                      Prior Function            PT Goals (current goals can now be found in the care plan section) Acute Rehab PT Goals Patient Stated Goal: Per wife to improve pt's safety. PT Goal Formulation: With patient/family Time For Goal Achievement: 04/24/15 Potential to Achieve Goals: Good Progress towards PT goals: Progressing toward goals    Frequency  Min 3X/week    PT Plan Current plan remains appropriate    Co-evaluation             End of Session Equipment Utilized During Treatment: Gait belt Activity Tolerance: Patient tolerated treatment well Patient left: in chair;with call bell/phone within reach;with chair alarm set     Time: [id number] PT Time Calculation (min) (ACUTE ONLY): 28 min  Charges:  $Gait Training: 8-22 mins $Therapeutic Activity: 8-22 mins  G Codes:      Lorriane Shire 04/11/2015, 9:58 AM

## 2015-04-11 NOTE — Progress Notes (Signed)
Temp of 103.1, skin cool to touch, making remarks that doesn't make sense. Alert and oriented to self.  Cool cloth implemented. NP made aware with new order of NS bolus and tylenol. Will continue to monitor.

## 2015-04-11 NOTE — Clinical Documentation Improvement (Signed)
Internal Medicine  Can the diagnosis of anemia be further specified? Please document findings in next progress note NOT in BPA drop down box. Thank you!   Iron deficiency Anemia  Nutritional anemia, including the nutrition or mineral deficits  Chronic Blood Loss Anemia, including the suspected or known cause  Anemia of chronic disease, including the associated chronic disease state  Other  Clinically Undetermined  Document any associated diagnoses/conditions.  Supporting Information:  H&H's running for current admission: 10.9/32, 10.1/32  Please exercise your independent, professional judgment when responding. A specific answer is not anticipated or expected.  Thank You,  Zoila Shutter RN, BSN, [place] [phone]; Cell: [phone]

## 2015-04-11 NOTE — Progress Notes (Signed)
PROGRESS NOTE    Ricky Conley  [id number]  DOB: Jun 01, 1945  DOA: 04/09/2015 PCP: Shirline Frees, MD Outpatient Specialists:   Hospital course: 70 year old male with history of diet-controlled DM, HLD, anxiety, depression, colon cancer status post colostomy, presented to [facility] ED on 04/09/15 with 2 day history of fevers, waxing and waning confusion, muscle aches, cough, chills and rhinorrhea. Patient's wife apparently also unwell. Febrile to 101.82F in the ED. Chest x-ray, urine microscopy and CT head unremarkable. Influenza A+. Continues to spike high fevers.  Assessment & Plan:   Influenza A with URTI - Continue droplet isolation, Tamiflu and symptomatic/supportive treatment. Discontinued empirically started IV vancomycin and Zosyn. - Continue IV fluids for additional 24 hours. - Patient complaining of increased cough and chest congestion. Swallows sputum. Requested follow-up chest x-ray.  Delirium - Secondary to influenza complicating advanced age. Resolved.  Anemia and thrombocytopenia - May be related to influenza. Anemia relatively stable. Thrombocytopenia slightly better. Follow CBC in a.m.  Hyperlipidemia - Continue statins  Anxiety and depression - Stable. Continue bupropion, clonazepam and divalproex.  Colon cancer status post colostomy - Said to be in remission.  DVT prophylaxis: Lovenox Code Status: Full Family Communication: Discussed with spouse at bedside on 2/28. Disposition Plan: DC home possibly 3/1. Home health PT at discharge and rolling walker with 5 inch wheels.   Consultants:  None  Procedures:  None  Antimicrobials:  Tamiflu 2/26 >  IV vancomycin and Zosyn-discontinued.   Subjective: Complaints of cough and chest congestion. Spiked temperature of 103.50F early this morning. Denies dyspnea or pain.  Objective: Filed Vitals:   04/11/15 0201 04/11/15 0326 04/11/15 0559 04/11/15 1009  BP: 157/97  144/65 168/80  Pulse: 85  78 85    Temp: 97.3 F (36.3 C) 103.1 F (39.5 C) 100 F (37.8 C) 98.4 F (36.9 C)  TempSrc: Oral Oral Oral Oral  Resp: 18  18 20   Height:      Weight:      SpO2: 98%  95% 95%    Intake/Output Summary (Last 24 hours) at 04/11/15 1148 Last data filed at 04/11/15 0700  Gross per 24 hour  Intake      0 ml  Output   2250 ml  Net  -2250 ml   Filed Weights   04/09/15 2008 04/10/15 0015  Weight: 81.647 kg (180 lb) 78.8 kg (173 lb 11.6 oz)    Exam:  General exam: Pleasant elderly male lying comfortably in bed. Does not appear septic or toxic. No neck stiffness. Respiratory system: Slightly harsh breath sounds bilaterally. No increased work of breathing. Cardiovascular system: S1 & S2 heard, RRR. No JVD, murmurs, gallops, clicks or pedal edema. Gastrointestinal system: Abdomen is nondistended, soft and nontender. Normal bowel sounds heard. Colostomy with normal brown colored stools. Central nervous system: Alert and oriented. No focal neurological deficits. Extremities: Symmetric 5 x 5 power.   Data Reviewed: Basic Metabolic Panel:  Recent Labs Lab 04/09/15 2110  NA 142  K 3.8  CL 111  CO2 25  GLUCOSE 116*  BUN 9  CREATININE 0.95  CALCIUM 7.6*   Liver Function Tests:  Recent Labs Lab 04/09/15 2110  AST 59*  ALT 46  ALKPHOS 252*  BILITOT 1.1  PROT 5.5*  ALBUMIN 2.5*   No results for input(s): LIPASE, AMYLASE in the last 168 hours. No results for input(s): AMMONIA in the last 168 hours. CBC:  Recent Labs Lab 04/09/15 2110 04/11/15 0355  WBC 4.4 4.4  NEUTROABS 3.5  --  HGB 10.9* 10.1*  HCT 32.4* 32.4*  MCV 93.4 95.3  PLT 99* 103*   Cardiac Enzymes: No results for input(s): CKTOTAL, CKMB, CKMBINDEX, TROPONINI in the last 168 hours. BNP (last 3 results) No results for input(s): PROBNP in the last 8760 hours. CBG:  Recent Labs Lab 04/09/15 2004 04/10/15 2230  GLUCAP 118* 158*    Recent Results (from the past 240 hour(s))  Culture, blood (routine x  2)     Status: None (Preliminary result)   Collection Time: 04/09/15  9:10 PM  Result Value Ref Range Status   Specimen Description BLOOD RIGHT FOREARM  Final   Special Requests BOTTLES DRAWN AEROBIC AND ANAEROBIC 10CC   Final   Culture NO GROWTH < 24 HOURS  Final   Report Status PENDING  Incomplete  Urine culture     Status: None   Collection Time: 04/09/15  9:16 PM  Result Value Ref Range Status   Specimen Description URINE, CLEAN CATCH  Final   Special Requests NONE  Final   Culture 8,000 COLONIES/mL INSIGNIFICANT GROWTH  Final   Report Status 04/10/2015 FINAL  Final  Culture, blood (routine x 2)     Status: None (Preliminary result)   Collection Time: 04/09/15  9:19 PM  Result Value Ref Range Status   Specimen Description BLOOD RIGHT ANTECUBITAL  Final   Special Requests BOTTLES DRAWN AEROBIC AND ANAEROBIC 10CC   Final   Culture NO GROWTH < 24 HOURS  Final   Report Status PENDING  Incomplete         Studies: Dg Chest 2 View  04/09/2015  CLINICAL DATA:  70 year old male with fever EXAM: CHEST  2 VIEW COMPARISON:  Chest CT dated 03/08/2013 FINDINGS: Two views of the chest do not demonstrate a focal consolidation. There is no pleural effusion or pneumothorax. There is minimal interstitial prominence throughout the left lung, likely atelectatic changes. Stable cardiac silhouette. Degenerative changes of the spine. No acute osseous pathology identified. IMPRESSION: No active cardiopulmonary disease. Electronically Signed   By: Anner Crete M.D.   On: 04/09/2015 21:14   Ct Head Wo Contrast  04/09/2015  CLINICAL DATA:  Altered mental status and fever beginning today. Found down on floor today, unwitnessed fall. History of diabetes. EXAM: CT HEAD WITHOUT CONTRAST TECHNIQUE: Contiguous axial images were obtained from the base of the skull through the vertex without intravenous contrast. COMPARISON:  CT head February 18, 2013 FINDINGS: Moderate ventriculomegaly on the basis of global  parenchymal brain volume loss, stable from prior examination. No intraparenchymal hemorrhage, mass effect nor midline shift. Patchy supratentorial white matter hypodensities are within less than expected for patient's age and though non-specific suggest sequelae of chronic small vessel ischemic disease. No acute large vascular territory infarcts. No abnormal extra-axial fluid collections. Basal cisterns are patent. Minimal calcific atherosclerosis of the carotid siphons. No skull fracture. The included ocular globes and orbital contents are non-suspicious. The mastoid aircells and included paranasal sinuses are well-aerated. Tube within LEFT external auditory canal. IMPRESSION: No acute intracranial process; negative CT head for age. Electronically Signed   By: Elon Alas M.D.   On: 04/09/2015 22:40        Scheduled Meds: . aspirin EC  81 mg Oral BH-q7a  . buPROPion  300 mg Oral Daily  . clonazePAM  1 mg Oral TID  . divalproex  1,500 mg Oral q morning - 10a  . enoxaparin (LOVENOX) injection  40 mg Subcutaneous Q24H  . gabapentin  300 mg Oral 2  times per day on Sun  . gabapentin  300 mg Oral 3 times per day on Mon Tue Wed Thu Fri Sat  . [START ON 04/16/2015] gabapentin  600 mg Oral Once per day on Sun  . meloxicam  7.5 mg Oral BID PC  . oseltamivir  75 mg Oral BID  . pravastatin  80 mg Oral QHS   Continuous Infusions: . sodium chloride      Principal Problem:   Altered mental status Active Problems:   Fever   Influenza-like illness   Delirium    Time spent: 25 minutes.    Vernell Leep, MD, FACP, FHM. [facility] Pager [phone]  If 7PM-7AM, please contact night-coverage [email or web address] Password [email or web address] 04/11/2015, 11:48 AM    LOS: 2 days

## 2015-04-12 DIAGNOSIS — R4182 Altered mental status, unspecified: Secondary | ICD-10-CM | POA: Diagnosis not present

## 2015-04-12 DIAGNOSIS — J111 Influenza due to unidentified influenza virus with other respiratory manifestations: Secondary | ICD-10-CM | POA: Diagnosis not present

## 2015-04-12 LAB — CBC
HCT: 34.8 % — ABNORMAL LOW (ref 39.0–52.0)
Hemoglobin: 10.9 g/dL — ABNORMAL LOW (ref 13.0–17.0)
MCH: 29.7 pg (ref 26.0–34.0)
MCHC: 31.3 g/dL (ref 30.0–36.0)
MCV: 94.8 fL (ref 78.0–100.0)
PLATELETS: 123 10*3/uL — AB (ref 150–400)
RBC: 3.67 MIL/uL — AB (ref 4.22–5.81)
RDW: 14.3 % (ref 11.5–15.5)
WBC: 4.4 10*3/uL (ref 4.0–10.5)

## 2015-04-12 MED ORDER — OSELTAMIVIR PHOSPHATE 75 MG PO CAPS: 75.0000 mg | ORAL_CAPSULE | Freq: Two times a day (BID) | ORAL | Status: DC

## 2015-04-12 MED ORDER — AMLODIPINE BESYLATE 5 MG PO TABS: 5.0000 mg | ORAL_TABLET | Freq: Every day | ORAL | Status: AC

## 2015-04-12 MED ORDER — AMLODIPINE BESYLATE 5 MG PO TABS
5.0000 mg | ORAL_TABLET | Freq: Every day | ORAL | Status: DC
  Administered 2015-04-12: 5 mg via ORAL
  Filled 2015-04-12: qty 1

## 2015-04-12 MED ORDER — AMLODIPINE BESYLATE 5 MG PO TABS
5.0000 mg | ORAL_TABLET | Freq: Every day | ORAL | Status: DC
Start: 2015-04-12 — End: 2015-04-12

## 2015-04-12 NOTE — Progress Notes (Signed)
Pt discharging home at this time with wife taking all personal belongings. Rolling walker delivered.  IV's discontinued, dry dressing applied. Discharge instructions and prescriptions provided with verbal understanding. Pt denies pain or discomfort.  No noted distress. Pt to receive [facility] services.

## 2015-04-12 NOTE — Care Management Note (Signed)
Case Management Note  Patient Details  Name: Ricky Conley MRN: [id number] Date of Birth: Feb 06, 1946  Subjective/Objective:                    Action/Plan: Patient discharging to home with orders for [facility] services. CM met with the patient and his wife and provided them a list of [place] agencies that [facility] will accept. They selected [place]. Stephanie with [facility] notified and accepted the referral. Patient also ordered a rolling walker. Jermaine with [facility] notified and will deliver the equipment to the room. Bedside RN updated.   Expected Discharge Date:  04/13/15               Expected Discharge Plan:  [place]  In-House Referral:     Discharge planning Services  CM Consult  Post Acute Care Choice:  Home Health, Durable Medical Equipment Choice offered to:  Patient, Spouse  DME Arranged:  Walker rolling DME Agency:  [place]:  PT [place]:  [place]  Status of Service:  Completed, signed off  Medicare Important Message Given:    Date Medicare IM Given:    Medicare IM give by:    Date Additional Medicare IM Given:    Additional Medicare Important Message give by:     If discussed at [place] of Stay Meetings, dates discussed:    Additional Comments:  Pollie Friar, RN 04/12/2015, 11:45 AM

## 2015-04-12 NOTE — Discharge Summary (Signed)
Physician Discharge Summary  Ricky Conley [id number] DOB: Nov 11, 1945 DOA: 04/09/2015  PCP: Shirline Frees, MD  Admit date: 04/09/2015 Discharge date: 04/12/2015  Time spent: > 35 minutes  Recommendations for Outpatient Follow-up:  1. Monitor blood pressures and adjust antihypertensive medication pending values. Wishes recently started on amlodipine this admission   Discharge Diagnoses:  Principal Problem:   Altered mental status Active Problems:   Fever   Influenza-like illness   Delirium   Discharge Condition: stable  Diet recommendation: low sodium diet  Filed Weights   04/09/15 2008 04/10/15 0015  Weight: 81.647 kg (180 lb) 78.8 kg (173 lb 11.6 oz)    History of present illness:  70 year old Caucasian male who presented to the ED with 2 day history of fever and altered mental status. Was diagnosed with influenza A and started on Tamiflu.  Hospital Course:  Influenza A -We'll continue Tamiflu for a total treatment regimen of 5 days - Patient is to continue supportive therapy with tylenol for discomfort and or fevers  Essential HTN - not well controlled as such started on amlodipine and recommended a low sodium diet. Discussed with patient and spouse.   Procedures:  None  Consultations:  None  Discharge Exam: Filed Vitals:   04/12/15 0900 04/12/15 1024  BP: 182/91 171/90  Pulse: 71 66  Temp: 98.4 F (36.9 C) 97.2 F (36.2 C)  Resp: 18 20    General: Pt in nad, alert and awake Cardiovascular: rrr, no mrg Respiratory: cta bl, no wheezes  Discharge Instructions   Discharge Instructions    Call MD for:  difficulty breathing, headache or visual disturbances    Complete by:  As directed      Call MD for:  redness, tenderness, or signs of infection (pain, swelling, redness, odor or green/yellow discharge around incision site)    Complete by:  As directed      Call MD for:  severe uncontrolled pain    Complete by:  As directed      Call MD for:   temperature >100.4    Complete by:  As directed      Diet - low sodium heart healthy    Complete by:  As directed      Discharge instructions    Complete by:  As directed   Please be sure to follow up with your primary care physician in the next 1-2 weeks.     Increase activity slowly    Complete by:  As directed           Current Discharge Medication List    START taking these medications   Details  amLODipine (NORVASC) 5 MG tablet Take 1 tablet (5 mg total) by mouth daily. Qty: 30 tablet, Refills: 0    oseltamivir (TAMIFLU) 75 MG capsule Take 1 capsule (75 mg total) by mouth 2 (two) times daily. Qty: 6 capsule, Refills: 0      CONTINUE these medications which have NOT CHANGED   Details  aspirin 81 MG EC tablet Take 81 mg by mouth every morning.     buPROPion (WELLBUTRIN XL) 300 MG 24 hr tablet Take 300 mg by mouth every morning.    clonazePAM (KLONOPIN) 2 MG tablet Take 1 mg by mouth 3 (three) times daily.     divalproex (DEPAKOTE ER) 500 MG 24 hr tablet Take 1,500 mg by mouth every morning.    gabapentin (NEURONTIN) 300 MG capsule Take 300-600 mg by mouth See admin instructions. Take 2 capsules in the  morning and 1 capsule at noon and 1 capsule at night on Sunday then take 1 capsule three times a day on the other days    pravastatin (PRAVACHOL) 40 MG tablet Take 80 mg by mouth at bedtime.       STOP taking these medications     meloxicam (MOBIC) 7.5 MG tablet        No Known Allergies    The results of significant diagnostics from this hospitalization (including imaging, microbiology, ancillary and laboratory) are listed below for reference.    Significant Diagnostic Studies: Dg Chest 2 View  04/11/2015  CLINICAL DATA:  70 year old male with history of cough and fever since yesterday. EXAM: CHEST  2 VIEW COMPARISON:  Chest x-ray 04/09/2015. FINDINGS: Lung volumes are normal. No consolidative airspace disease. No pleural effusions. No pneumothorax. No pulmonary  nodule or mass noted. Pulmonary vasculature and the cardiomediastinal silhouette are within normal limits. IMPRESSION: No radiographic evidence of acute cardiopulmonary disease. Electronically Signed   By: Vinnie Langton M.D.   On: 04/11/2015 12:53   Dg Chest 2 View  04/09/2015  CLINICAL DATA:  70 year old male with fever EXAM: CHEST  2 VIEW COMPARISON:  Chest CT dated 03/08/2013 FINDINGS: Two views of the chest do not demonstrate a focal consolidation. There is no pleural effusion or pneumothorax. There is minimal interstitial prominence throughout the left lung, likely atelectatic changes. Stable cardiac silhouette. Degenerative changes of the spine. No acute osseous pathology identified. IMPRESSION: No active cardiopulmonary disease. Electronically Signed   By: Anner Crete M.D.   On: 04/09/2015 21:14   Ct Head Wo Contrast  04/09/2015  CLINICAL DATA:  Altered mental status and fever beginning today. Found down on floor today, unwitnessed fall. History of diabetes. EXAM: CT HEAD WITHOUT CONTRAST TECHNIQUE: Contiguous axial images were obtained from the base of the skull through the vertex without intravenous contrast. COMPARISON:  CT head February 18, 2013 FINDINGS: Moderate ventriculomegaly on the basis of global parenchymal brain volume loss, stable from prior examination. No intraparenchymal hemorrhage, mass effect nor midline shift. Patchy supratentorial white matter hypodensities are within less than expected for patient's age and though non-specific suggest sequelae of chronic small vessel ischemic disease. No acute large vascular territory infarcts. No abnormal extra-axial fluid collections. Basal cisterns are patent. Minimal calcific atherosclerosis of the carotid siphons. No skull fracture. The included ocular globes and orbital contents are non-suspicious. The mastoid aircells and included paranasal sinuses are well-aerated. Tube within LEFT external auditory canal. IMPRESSION: No acute  intracranial process; negative CT head for age. Electronically Signed   By: Elon Alas M.D.   On: 04/09/2015 22:40    Microbiology: Recent Results (from the past 240 hour(s))  Culture, blood (routine x 2)     Status: None (Preliminary result)   Collection Time: 04/09/15  9:10 PM  Result Value Ref Range Status   Specimen Description BLOOD RIGHT FOREARM  Final   Special Requests BOTTLES DRAWN AEROBIC AND ANAEROBIC 10CC   Final   Culture NO GROWTH 2 DAYS  Final   Report Status PENDING  Incomplete  Urine culture     Status: None   Collection Time: 04/09/15  9:16 PM  Result Value Ref Range Status   Specimen Description URINE, CLEAN CATCH  Final   Special Requests NONE  Final   Culture 8,000 COLONIES/mL INSIGNIFICANT GROWTH  Final   Report Status 04/10/2015 FINAL  Final  Culture, blood (routine x 2)     Status: None (Preliminary result)  Collection Time: 04/09/15  9:19 PM  Result Value Ref Range Status   Specimen Description BLOOD RIGHT ANTECUBITAL  Final   Special Requests BOTTLES DRAWN AEROBIC AND ANAEROBIC 10CC   Final   Culture NO GROWTH 2 DAYS  Final   Report Status PENDING  Incomplete     Labs: Basic Metabolic Panel:  Recent Labs Lab 04/09/15 2110  NA 142  K 3.8  CL 111  CO2 25  GLUCOSE 116*  BUN 9  CREATININE 0.95  CALCIUM 7.6*   Liver Function Tests:  Recent Labs Lab 04/09/15 2110  AST 59*  ALT 46  ALKPHOS 252*  BILITOT 1.1  PROT 5.5*  ALBUMIN 2.5*   No results for input(s): LIPASE, AMYLASE in the last 168 hours. No results for input(s): AMMONIA in the last 168 hours. CBC:  Recent Labs Lab 04/09/15 2110 04/11/15 0355 04/12/15 0632  WBC 4.4 4.4 4.4  NEUTROABS 3.5  --   --   HGB 10.9* 10.1* 10.9*  HCT 32.4* 32.4* 34.8*  MCV 93.4 95.3 94.8  PLT 99* 103* 123*   Cardiac Enzymes: No results for input(s): CKTOTAL, CKMB, CKMBINDEX, TROPONINI in the last 168 hours. BNP: BNP (last 3 results) No results for input(s): BNP in the last 8760  hours.  ProBNP (last 3 results) No results for input(s): PROBNP in the last 8760 hours.  CBG:  Recent Labs Lab 04/09/15 2004 04/10/15 2230  GLUCAP 118* 158*    Signed:  Velvet Bathe MD.  [facility] 04/12/2015, 10:59 AM

## 2015-04-13 DIAGNOSIS — J111 Influenza due to unidentified influenza virus with other respiratory manifestations: Secondary | ICD-10-CM | POA: Diagnosis not present

## 2015-04-13 DIAGNOSIS — R531 Weakness: Secondary | ICD-10-CM | POA: Diagnosis not present

## 2015-04-13 DIAGNOSIS — F319 Bipolar disorder, unspecified: Secondary | ICD-10-CM | POA: Diagnosis not present

## 2015-04-13 DIAGNOSIS — R269 Unspecified abnormalities of gait and mobility: Secondary | ICD-10-CM | POA: Diagnosis not present

## 2015-04-13 DIAGNOSIS — E119 Type 2 diabetes mellitus without complications: Secondary | ICD-10-CM | POA: Diagnosis not present

## 2015-04-14 LAB — CULTURE, BLOOD (ROUTINE X 2)
CULTURE: NO GROWTH
Culture: NO GROWTH

## 2015-04-15 DIAGNOSIS — T1490 Injury, unspecified: Secondary | ICD-10-CM | POA: Diagnosis not present

## 2015-04-17 DIAGNOSIS — Z933 Colostomy status: Secondary | ICD-10-CM | POA: Diagnosis not present

## 2015-04-18 ENCOUNTER — Encounter (HOSPITAL_COMMUNITY): Payer: Self-pay

## 2015-04-18 ENCOUNTER — Emergency Department (HOSPITAL_COMMUNITY): Payer: PPO

## 2015-04-18 ENCOUNTER — Inpatient Hospital Stay (HOSPITAL_COMMUNITY)
Admission: EM | Admit: 2015-04-18 | Discharge: 2015-04-21 | DRG: 057 | Disposition: A | Discharge status: Skilled Nursing Facility | Payer: PPO | Attending: Internal Medicine | Admitting: Internal Medicine

## 2015-04-18 DIAGNOSIS — F419 Anxiety disorder, unspecified: Secondary | ICD-10-CM | POA: Diagnosis present

## 2015-04-18 DIAGNOSIS — F0390 Unspecified dementia without behavioral disturbance: Secondary | ICD-10-CM | POA: Diagnosis not present

## 2015-04-18 DIAGNOSIS — R296 Repeated falls: Secondary | ICD-10-CM | POA: Diagnosis present

## 2015-04-18 DIAGNOSIS — F329 Major depressive disorder, single episode, unspecified: Secondary | ICD-10-CM | POA: Diagnosis not present

## 2015-04-18 DIAGNOSIS — Z87891 Personal history of nicotine dependence: Secondary | ICD-10-CM

## 2015-04-18 DIAGNOSIS — R531 Weakness: Secondary | ICD-10-CM

## 2015-04-18 DIAGNOSIS — Z9049 Acquired absence of other specified parts of digestive tract: Secondary | ICD-10-CM

## 2015-04-18 DIAGNOSIS — I959 Hypotension, unspecified: Secondary | ICD-10-CM | POA: Diagnosis not present

## 2015-04-18 DIAGNOSIS — R7989 Other specified abnormal findings of blood chemistry: Secondary | ICD-10-CM | POA: Diagnosis present

## 2015-04-18 DIAGNOSIS — F319 Bipolar disorder, unspecified: Secondary | ICD-10-CM | POA: Diagnosis not present

## 2015-04-18 DIAGNOSIS — R4182 Altered mental status, unspecified: Secondary | ICD-10-CM | POA: Diagnosis not present

## 2015-04-18 DIAGNOSIS — E785 Hyperlipidemia, unspecified: Secondary | ICD-10-CM | POA: Diagnosis present

## 2015-04-18 DIAGNOSIS — Z85038 Personal history of other malignant neoplasm of large intestine: Secondary | ICD-10-CM | POA: Diagnosis not present

## 2015-04-18 DIAGNOSIS — E119 Type 2 diabetes mellitus without complications: Secondary | ICD-10-CM | POA: Diagnosis present

## 2015-04-18 DIAGNOSIS — Z7982 Long term (current) use of aspirin: Secondary | ICD-10-CM | POA: Diagnosis not present

## 2015-04-18 DIAGNOSIS — I712 Thoracic aortic aneurysm, without rupture: Secondary | ICD-10-CM | POA: Diagnosis not present

## 2015-04-18 DIAGNOSIS — I9589 Other hypotension: Secondary | ICD-10-CM

## 2015-04-18 DIAGNOSIS — R945 Abnormal results of liver function studies: Secondary | ICD-10-CM

## 2015-04-18 DIAGNOSIS — Z809 Family history of malignant neoplasm, unspecified: Secondary | ICD-10-CM | POA: Diagnosis not present

## 2015-04-18 DIAGNOSIS — Z85048 Personal history of other malignant neoplasm of rectum, rectosigmoid junction, and anus: Secondary | ICD-10-CM | POA: Diagnosis not present

## 2015-04-18 DIAGNOSIS — R740 Nonspecific elevation of levels of transaminase and lactic acid dehydrogenase [LDH]: Secondary | ICD-10-CM

## 2015-04-18 DIAGNOSIS — G20 Parkinson's disease: Principal | ICD-10-CM | POA: Diagnosis present

## 2015-04-18 DIAGNOSIS — R509 Fever, unspecified: Secondary | ICD-10-CM | POA: Diagnosis not present

## 2015-04-18 DIAGNOSIS — R7401 Elevation of levels of liver transaminase levels: Secondary | ICD-10-CM | POA: Diagnosis present

## 2015-04-18 DIAGNOSIS — R404 Transient alteration of awareness: Secondary | ICD-10-CM | POA: Diagnosis not present

## 2015-04-18 DIAGNOSIS — Z933 Colostomy status: Secondary | ICD-10-CM

## 2015-04-18 DIAGNOSIS — R410 Disorientation, unspecified: Secondary | ICD-10-CM | POA: Diagnosis not present

## 2015-04-18 DIAGNOSIS — E1165 Type 2 diabetes mellitus with hyperglycemia: Secondary | ICD-10-CM | POA: Diagnosis not present

## 2015-04-18 DIAGNOSIS — R2689 Other abnormalities of gait and mobility: Secondary | ICD-10-CM | POA: Diagnosis present

## 2015-04-18 DIAGNOSIS — R29818 Other symptoms and signs involving the nervous system: Secondary | ICD-10-CM | POA: Diagnosis not present

## 2015-04-18 DIAGNOSIS — E860 Dehydration: Secondary | ICD-10-CM | POA: Diagnosis not present

## 2015-04-18 DIAGNOSIS — N179 Acute kidney failure, unspecified: Secondary | ICD-10-CM | POA: Diagnosis not present

## 2015-04-18 DIAGNOSIS — Z79899 Other long term (current) drug therapy: Secondary | ICD-10-CM | POA: Diagnosis not present

## 2015-04-18 DIAGNOSIS — I952 Hypotension due to drugs: Secondary | ICD-10-CM | POA: Diagnosis not present

## 2015-04-18 LAB — CBC WITH DIFFERENTIAL/PLATELET
BASOS ABS: 0 10*3/uL (ref 0.0–0.1)
BASOS PCT: 0 %
EOS PCT: 1 %
Eosinophils Absolute: 0.1 10*3/uL (ref 0.0–0.7)
HEMATOCRIT: 37.6 % — AB (ref 39.0–52.0)
Hemoglobin: 11.9 g/dL — ABNORMAL LOW (ref 13.0–17.0)
Lymphocytes Relative: 11 %
Lymphs Abs: 1 10*3/uL (ref 0.7–4.0)
MCH: 29.6 pg (ref 26.0–34.0)
MCHC: 31.6 g/dL (ref 30.0–36.0)
MCV: 93.5 fL (ref 78.0–100.0)
MONO ABS: 1 10*3/uL (ref 0.1–1.0)
Monocytes Relative: 10 %
NEUTROS ABS: 7.2 10*3/uL (ref 1.7–7.7)
Neutrophils Relative %: 78 %
PLATELETS: 199 10*3/uL (ref 150–400)
RBC: 4.02 MIL/uL — AB (ref 4.22–5.81)
RDW: 14.1 % (ref 11.5–15.5)
WBC: 9.2 10*3/uL (ref 4.0–10.5)

## 2015-04-18 LAB — I-STAT CG4 LACTIC ACID, ED
LACTIC ACID, VENOUS: 1.51 mmol/L (ref 0.5–2.0)
Lactic Acid, Venous: 1.12 mmol/L (ref 0.5–2.0)

## 2015-04-18 LAB — COMPREHENSIVE METABOLIC PANEL
ALT: 48 U/L (ref 17–63)
ANION GAP: 10 (ref 5–15)
AST: 48 U/L — AB (ref 15–41)
Albumin: 2.7 g/dL — ABNORMAL LOW (ref 3.5–5.0)
Alkaline Phosphatase: 302 U/L — ABNORMAL HIGH (ref 38–126)
BILIRUBIN TOTAL: 1.6 mg/dL — AB (ref 0.3–1.2)
BUN: 18 mg/dL (ref 6–20)
CO2: 25 mmol/L (ref 22–32)
Calcium: 8.3 mg/dL — ABNORMAL LOW (ref 8.9–10.3)
Chloride: 104 mmol/L (ref 101–111)
Creatinine, Ser: 1.3 mg/dL — ABNORMAL HIGH (ref 0.61–1.24)
GFR, EST NON AFRICAN AMERICAN: 54 mL/min — AB (ref >60)
Glucose, Bld: 124 mg/dL — ABNORMAL HIGH (ref 65–99)
POTASSIUM: 4.7 mmol/L (ref 3.5–5.1)
Sodium: 139 mmol/L (ref 135–145)
TOTAL PROTEIN: 7 g/dL (ref 6.5–8.1)

## 2015-04-18 LAB — URINALYSIS, ROUTINE W REFLEX MICROSCOPIC
Bilirubin Urine: NEGATIVE
Glucose, UA: NEGATIVE mg/dL
KETONES UR: NEGATIVE mg/dL
LEUKOCYTES UA: NEGATIVE
NITRITE: NEGATIVE
PROTEIN: NEGATIVE mg/dL
Specific Gravity, Urine: 1.014 (ref 1.005–1.030)
pH: 6 (ref 5.0–8.0)

## 2015-04-18 LAB — VALPROIC ACID LEVEL: Valproic Acid Lvl: 36 ug/mL — ABNORMAL LOW (ref 50.0–100.0)

## 2015-04-18 LAB — URINE MICROSCOPIC-ADD ON

## 2015-04-18 LAB — AMMONIA: AMMONIA: 35 umol/L (ref 9–35)

## 2015-04-18 MED ORDER — ONDANSETRON HCL 4 MG/2ML IJ SOLN: 4.0000 mg | Freq: Four times a day (QID) | INTRAMUSCULAR | Status: DC | PRN

## 2015-04-18 MED ORDER — ENOXAPARIN SODIUM 40 MG/0.4ML ~~LOC~~ SOLN
40.0000 mg | SUBCUTANEOUS | Status: DC
  Administered 2015-04-18 – 2015-04-20 (×3): 40 mg via SUBCUTANEOUS
  Filled 2015-04-18 (×3): qty 0.4

## 2015-04-18 MED ORDER — ONDANSETRON HCL 4 MG PO TABS: 4.0000 mg | ORAL_TABLET | Freq: Four times a day (QID) | ORAL | Status: DC | PRN

## 2015-04-18 MED ORDER — ACETAMINOPHEN 325 MG PO TABS
650.0000 mg | ORAL_TABLET | Freq: Four times a day (QID) | ORAL | Status: DC | PRN
  Administered 2015-04-20 – 2015-04-21 (×3): 650 mg via ORAL
  Filled 2015-04-18 (×3): qty 2

## 2015-04-18 MED ORDER — CLONAZEPAM 1 MG PO TABS
1.0000 mg | ORAL_TABLET | Freq: Three times a day (TID) | ORAL | Status: DC
  Administered 2015-04-18 – 2015-04-21 (×8): 1 mg via ORAL
  Filled 2015-04-18 (×8): qty 1

## 2015-04-18 MED ORDER — DIVALPROEX SODIUM ER 500 MG PO TB24
1500.0000 mg | ORAL_TABLET | Freq: Every morning | ORAL | Status: DC
  Administered 2015-04-19 – 2015-04-21 (×3): 1500 mg via ORAL
  Filled 2015-04-18 (×6): qty 3

## 2015-04-18 MED ORDER — SENNOSIDES-DOCUSATE SODIUM 8.6-50 MG PO TABS: 1.0000 | ORAL_TABLET | Freq: Every evening | ORAL | Status: DC | PRN

## 2015-04-18 MED ORDER — HYDROCODONE-ACETAMINOPHEN 5-325 MG PO TABS
1.0000 | ORAL_TABLET | ORAL | Status: DC | PRN
Start: 2015-04-18 — End: 2015-04-21

## 2015-04-18 MED ORDER — GABAPENTIN 300 MG PO CAPS
300.0000 mg | ORAL_CAPSULE | ORAL | Status: DC
  Administered 2015-04-18 – 2015-04-21 (×8): 300 mg via ORAL
  Filled 2015-04-18 (×7): qty 1

## 2015-04-18 MED ORDER — SODIUM CHLORIDE 0.9 % IV BOLUS (SEPSIS)
1000.0000 mL | INTRAVENOUS | Status: AC
  Administered 2015-04-18 (×2): 1000 mL via INTRAVENOUS

## 2015-04-18 MED ORDER — BUPROPION HCL ER (XL) 150 MG PO TB24
300.0000 mg | ORAL_TABLET | ORAL | Status: DC
  Administered 2015-04-20 – 2015-04-21 (×2): 300 mg via ORAL
  Filled 2015-04-18 (×2): qty 2

## 2015-04-18 MED ORDER — GABAPENTIN 300 MG PO CAPS: 600.0000 mg | ORAL_CAPSULE | ORAL | Status: DC

## 2015-04-18 MED ORDER — ASPIRIN EC 81 MG PO TBEC
81.0000 mg | DELAYED_RELEASE_TABLET | ORAL | Status: DC
  Administered 2015-04-19 – 2015-04-21 (×3): 81 mg via ORAL
  Filled 2015-04-18 (×3): qty 1

## 2015-04-18 MED ORDER — GABAPENTIN 300 MG PO CAPS: 300.0000 mg | ORAL_CAPSULE | ORAL | Status: DC

## 2015-04-18 MED ORDER — ACETAMINOPHEN 650 MG RE SUPP: 650.0000 mg | Freq: Four times a day (QID) | RECTAL | Status: DC | PRN

## 2015-04-18 MED ORDER — SODIUM CHLORIDE 0.9 % IV BOLUS (SEPSIS)
500.0000 mL | INTRAVENOUS | Status: AC
  Administered 2015-04-18: 500 mL via INTRAVENOUS

## 2015-04-18 MED ORDER — DEXTROSE 5 % IV SOLN
2.0000 g | Freq: Once | INTRAVENOUS | Status: AC
  Administered 2015-04-18: 2 g via INTRAVENOUS
  Filled 2015-04-18: qty 2

## 2015-04-18 MED ORDER — VANCOMYCIN HCL IN DEXTROSE 1-5 GM/200ML-% IV SOLN
1000.0000 mg | Freq: Once | INTRAVENOUS | Status: AC
  Administered 2015-04-18: 1000 mg via INTRAVENOUS
  Filled 2015-04-18: qty 200

## 2015-04-18 MED ORDER — SODIUM CHLORIDE 0.9 % IV SOLN
INTRAVENOUS | Status: DC
Start: 2015-04-18 — End: 2015-04-21
  Administered 2015-04-18: 20:00:00 via INTRAVENOUS

## 2015-04-18 NOTE — ED Notes (Signed)
Attempted report.  Pt has not been assigned yet

## 2015-04-18 NOTE — ED Notes (Signed)
Supper ordered  

## 2015-04-18 NOTE — ED Notes (Signed)
[place] MD made aware of pt

## 2015-04-18 NOTE — Progress Notes (Signed)
Pharmacy Antibiotic Note  Ricky Conley is a 70 y.o. male admitted on 04/18/2015 with pneumonia.  Pharmacy has been consulted for cefepime and vancomycin dosing. Pt presents with increased confusion and hypotension.   Pt received vancomycin 1g and cefepime 2g IV once in the ED.  Plan: Vancomycin 750mg  IV every 12 hours.  Goal trough 15-20 mcg/mL.  Cefepime 2g IV q12h Monitor culture data, renal function and clinical course VT at SS prn  Height: 5\' 10"  (177.8 cm) Weight: 170 lb (77.111 kg) IBW/kg (Calculated) : 73  Temp (24hrs), Avg:97.3 F (36.3 C), Min:97.3 F (36.3 C), Max:97.3 F (36.3 C)   Recent Labs Lab 04/12/15 0632  WBC 4.4    Estimated Creatinine Clearance: 73.6 mL/min (by C-G formula based on Cr of 0.95).    No Known Allergies  Antimicrobials this admission: Cefepime 3/7 >>  Vanc 3/7 >>   Dose adjustments this admission: n/a  Microbiology results: 3/7 BCx: sent  UCx:    Sputum:    MRSA PCR:    Andrey Cota. Diona Foley, PharmD, [place] Clinical Pharmacist Pager [phone] 04/18/2015 12:36 PM

## 2015-04-18 NOTE — ED Notes (Signed)
Pt from [place] for increased confusion and hypotension.  Pt recently d/c from hospital for flu on 3/1.  Pt became diaphoretic, lethargic and weak while at doctor's office and manual bp was 60/40.  Pt given 800cc NS PTA and latest bp was 113/56.  Pt remains confused from baseline.

## 2015-04-18 NOTE — ED Provider Notes (Signed)
CSN: [id number]     Arrival date & time 04/18/15  1210 History   First MD Initiated Contact with Patient 04/18/15 1212     Chief Complaint  Patient presents with  . Hypotension     (Consider location/radiation/quality/duration/timing/severity/associated sxs/prior Treatment) HPI Comments: After flu had improved, generalized weakness, recent hospitalization and discharge last week Now confusion, generalized weakness, difficulty getting to bathroom Cannot lift arms to feed self Reports weakness in both arms, both legs Cannot stand up When he is assisted in standing, he walks with shuffling gait Blood pressure initially ok in PCP office, then low on recheck 0000000 systolic, and pt became lethargic, diaphoretic Dry cough since had flu, improved in last day or two Intermittent fevers at home, since flu diagnosis at home 101, on Saturday most recent Hx of mild dementia, forgot how to make bird houses     Past Medical History  Diagnosis Date  . Arthritis   . Anxiety   . Depression   . Hyperlipidemia   . Ascending aortic aneurysm ([place])   . Cancer ([place])   . Colon cancer ([place]) 2001  . Bipolar disorder ([place])   . Diet-controlled diabetes mellitus [facility])    Past Surgical History  Procedure Laterality Date  . Colonoscopy    . Back surgery  2003  . Partial colectomy  2001  . Colostomy  2001  . Fracture surgery Left 2009    ankle with hardware  . Carpal tunnel release Bilateral 2008   Family History  Problem Relation Age of Onset  . Cancer Maternal Uncle   . Cancer Brother   . Colon cancer Neg Hx    Social History  Substance Use Topics  . Smoking status: Former Smoker    Quit date: 05/30/1970  . Smokeless tobacco: Former Systems developer    Quit date: 05/30/1998  . Alcohol Use: No    Review of Systems  Constitutional: Positive for fever, activity change, appetite change (today\) and fatigue.  HENT: Negative for sore throat.   Eyes: Negative for visual disturbance.  Respiratory:  Positive for cough. Negative for shortness of breath.   Cardiovascular: Negative for chest pain and leg swelling.  Gastrointestinal: Negative for nausea, abdominal pain and diarrhea. Constipation: mildly decreased ostomy output.  Genitourinary: Negative for difficulty urinating.  Musculoskeletal: Negative for back pain and neck stiffness.  Skin: Negative for rash.  Neurological: Negative for syncope and headaches ("never in my life").  Psychiatric/Behavioral: Positive for confusion.      Allergies  Review of patient's allergies indicates no known allergies.  Home Medications   Prior to Admission medications   Medication Sig Start Date End Date Taking? Authorizing Provider  amLODipine (NORVASC) 5 MG tablet Take 1 tablet (5 mg total) by mouth daily. 04/12/15  Yes Velvet Bathe, MD  aspirin 81 MG EC tablet Take 81 mg by mouth every morning.    Yes Historical Provider, MD  buPROPion (WELLBUTRIN XL) 300 MG 24 hr tablet Take 300 mg by mouth every morning.   Yes Historical Provider, MD  clonazePAM (KLONOPIN) 2 MG tablet Take 1 mg by mouth 3 (three) times daily.    Yes Historical Provider, MD  divalproex (DEPAKOTE ER) 500 MG 24 hr tablet Take 1,500 mg by mouth every morning.   Yes Historical Provider, MD  gabapentin (NEURONTIN) 300 MG capsule Take 300-600 mg by mouth See admin instructions. Take 2 capsules in the morning and 1 capsule at noon and 1 capsule at night on Sunday then take 1 capsule three  times a day on the other days   Yes Historical Provider, MD  pravastatin (PRAVACHOL) 40 MG tablet Take 80 mg by mouth at bedtime.    Yes Historical Provider, MD  oseltamivir (TAMIFLU) 75 MG capsule Take 1 capsule (75 mg total) by mouth 2 (two) times daily. Patient not taking: Reported on 04/18/2015 04/12/15   Velvet Bathe, MD   BP 143/84 mmHg  Pulse 71  Temp(Src) 97.3 F (36.3 C) (Oral)  Resp 14  Ht 5\' 10"  (1.778 m)  Wt 170 lb (77.111 kg)  BMI 24.39 kg/m2  SpO2 97% Physical Exam  Constitutional:  He is oriented to person, place, and time. He appears well-developed and well-nourished. No distress.  HENT:  Head: Normocephalic and atraumatic.  Mouth/Throat: No oropharyngeal exudate.  Eyes: Conjunctivae and EOM are normal. Pupils are equal, round, and reactive to light.  Neck: Normal range of motion. No rigidity. Normal range of motion present. No Brudzinski's sign noted.  Cardiovascular: Normal rate, regular rhythm, normal heart sounds and intact distal pulses.  Exam reveals no gallop and no friction rub.   No murmur heard. Pulmonary/Chest: Effort normal and breath sounds normal. No respiratory distress. He has no wheezes. He has no rales.  Abdominal: Soft. He exhibits no distension. There is no tenderness. There is no guarding.  Musculoskeletal: He exhibits no edema.  Neurological: He is alert and oriented to person, place, and time. He has normal strength. No cranial nerve deficit or sensory deficit. Coordination normal. GCS eye subscore is 4. GCS verbal subscore is 5. GCS motor subscore is 6.  Skin: Skin is warm and dry. He is not diaphoretic.  Nursing note and vitals reviewed.   ED Course  Procedures (including critical care time) Labs Review Labs Reviewed  COMPREHENSIVE METABOLIC PANEL - Abnormal; Notable for the following:    Glucose, Bld 124 (*)    Creatinine, Ser 1.30 (*)    Calcium 8.3 (*)    Albumin 2.7 (*)    AST 48 (*)    Alkaline Phosphatase 302 (*)    Total Bilirubin 1.6 (*)    GFR calc non Af Amer 54 (*)    All other components within normal limits  CBC WITH DIFFERENTIAL/PLATELET - Abnormal; Notable for the following:    RBC 4.02 (*)    Hemoglobin 11.9 (*)    HCT 37.6 (*)    All other components within normal limits  URINALYSIS, ROUTINE W REFLEX MICROSCOPIC (NOT AT [facility]) - Abnormal; Notable for the following:    Color, Urine AMBER (*)    Hgb urine dipstick SMALL (*)    All other components within normal limits  URINE MICROSCOPIC-ADD ON - Abnormal; Notable  for the following:    Squamous Epithelial / LPF 0-5 (*)    Bacteria, UA RARE (*)    All other components within normal limits  CULTURE, BLOOD (ROUTINE X 2)  CULTURE, BLOOD (ROUTINE X 2)  URINE CULTURE  AMMONIA  I-STAT CG4 LACTIC ACID, ED  I-STAT CG4 LACTIC ACID, ED    Imaging Review Ct Head Wo Contrast  04/18/2015  CLINICAL DATA:  Increasing confusion with diaphoresis and lethargy. Hypotension. EXAM: CT HEAD WITHOUT CONTRAST TECHNIQUE: Contiguous axial images were obtained from the base of the skull through the vertex without intravenous contrast. COMPARISON:  Head CT 04/09/2015 and 02/18/2013. FINDINGS: Brain: There is no evidence of acute intracranial hemorrhage, mass lesion, brain edema or extra-axial fluid collection. The ventricles and subarachnoid spaces are prominent but unchanged. There is no CT  evidence of acute cortical infarction. There is stable minimal periventricular white matter disease. Bones/sinuses/visualized face: The visualized paranasal sinuses, mastoid air cells and middle ears are clear. The calvarium is intact. IMPRESSION: Stable head CT.  No acute intracranial findings. Electronically Signed   By: Richardean Sale M.D.   On: 04/18/2015 14:47   Dg Chest Port 1 View  04/18/2015  CLINICAL DATA:  Hypotension. Fever. Altered mental status. History of rectal and colon cancer. EXAM: PORTABLE CHEST 1 VIEW COMPARISON:  04/11/2015 FINDINGS: Normal heart size. Lungs clear. No pneumothorax. No pleural effusion. IMPRESSION: No active disease. Electronically Signed   By: Marybelle Killings M.D.   On: 04/18/2015 13:45   I have personally reviewed and evaluated these images and lab results as part of my medical decision-making.   EKG Interpretation None      MDM   Final diagnoses:  Altered mental status, unspecified altered mental status type    70 year old male with a history of colon cancer, bipolar, diabetes, mild dementia per wife, recent admission to the hospital for influenza  presents with concern for altered mental status and hypotension while at his PCPs office. Wife reports that since discharge from the hospital, patient had initially seemed to be improving, however over the last several days has had progressive confusion, generalized weakness, inability to bring fork to mouth and difficulty ambulating.  CT head done given hx of fall and shows no acute abnormality. Patient's neurologic exam is nonfocal, and have low suspicion for CVA. He has full range of motion of his neck, no nuchal rigidity, negative Brudzinski's and have low suspicion for meningitis and encephalitis.  Given history of recent fevers, recent influenza with hospitalization, cough and decreased blood pressures at PCPs office, on patient's arrival treated him as a code sepsis. He is given 30 mL/kg of normal saline, vancomycin and cefepime for suspected HCAP.  Patient's chest x-ray, however is less concerning for pneumonia. Urinalysis shows no signs of UTI. Blood cultures have been sent. Patient has benign abdominal exam and doubt intra-abdominal source of symptoms.  Patient's blood pressures have been stable since his arrival to the emergency department. Labs are significant for a mild acute kidney injury, likely secondary to dehydration.  Symptoms may be secondary to multiple medications in setting of dementia and delirium from recent influenza and hospitalization, initiation of a new blood pressure medication with possible hypertensive episodes at home, or possible other infection not detected at this time.  Patient with stable vital signs in ED, however recommend admission to hospital for observation and continued care.   Gareth Morgan, MD 04/18/15 [phone]

## 2015-04-18 NOTE — H&P (Signed)
[facility] History and Physical  Ricky Conley ERD:[id number] DOB: 09-29-1945 DOA: 04/18/2015  Referring physician: Emergency Department PCP: Shirline Frees, MD   CHIEF COMPLAINT:  Weakness. Low BP today at PCP's                 HPI: Ricky Conley is a 70 y.o. male hospitalized a few days ago with influenza A, treated with tamiflu. Symptoms improvedPatient sent to ED by PCP today after an episode of hypotension and diaphoresis (in sitting position) at their office. He had IV fluids by EMS, was normotensive on arrival to ED. Wife states patient has been having progressive weakness since recent hospital discharge. He is barely able to walk or feed himself. She had to stand behind patient and remind him to put one foot in front of the other. He has been having balance problems and all of this started after discharge home from Hospital. Patient was getting PT at home but decided he didn't need it anymore. Wife states that patient was in fact improving until a few days ago. He was running low grade fevers at home until Saturday.       ED COURSE:   Afebrile, vital signs stable.       Labs:   Lactic acid normal 1.12 <<<< 1.51 Normal white count  Urinalysis:    Clear, rare bacteria, negative ketones, negative leukocytes, 6-30 RBCs, 0-5 WBCs    EKG:    Sinus rhythm Abnormal R-wave progression, early transition Probable left ventricular hypertrophy                   Medications  sodium chloride 0.9 % bolus 1,000 mL (0 mLs Intravenous Stopped 04/18/15 1604)    Followed by  sodium chloride 0.9 % bolus 500 mL (0 mLs Intravenous Stopped 04/18/15 1450)  ceFEPIme (MAXIPIME) 2 g in dextrose 5 % 50 mL IVPB (0 g Intravenous Stopped 04/18/15 1332)  vancomycin (VANCOCIN) IVPB 1000 mg/200 mL premix (0 mg Intravenous Stopped 04/18/15 1433)    Review of Systems  Constitutional: Positive for malaise/fatigue.  HENT: Negative.   Eyes: Negative.   Respiratory: Positive for cough.   Cardiovascular: Negative.    Gastrointestinal: Negative.   Genitourinary: Negative.   Musculoskeletal: Negative.   Skin: Negative.   Neurological: Positive for weakness.  Endo/Heme/Allergies: Negative.   Psychiatric/Behavioral: Negative.     Past Medical History  Diagnosis Date  . Arthritis   . Anxiety   . Depression   . Hyperlipidemia   . Ascending aortic aneurysm ([place])   . Cancer ([place])   . Colon cancer ([place]) 2001  . Bipolar disorder ([place])   . Diet-controlled diabetes mellitus [facility])    Past Surgical History  Procedure Laterality Date  . Colonoscopy    . Back surgery  2003  . Partial colectomy  2001  . Colostomy  2001  . Fracture surgery Left 2009    ankle with hardware  . Carpal tunnel release Bilateral 2008    SOCIAL HISTORY:  reports that he quit smoking about 44 years ago. He quit smokeless tobacco use about 16 years ago. He reports that he does not drink alcohol or use illicit drugs. Lives: At home with wife     Assistive devices:   Walker needed for ambulation.   No Known Allergies  Family History  Problem Relation Age of Onset  . Cancer Maternal Uncle   . Cancer Brother   . Colon cancer Neg Hx     Prior to Admission  medications   Medication Sig Start Date End Date Taking? Authorizing Provider  amLODipine (NORVASC) 5 MG tablet Take 1 tablet (5 mg total) by mouth daily. 04/12/15  Yes Velvet Bathe, MD  aspirin 81 MG EC tablet Take 81 mg by mouth every morning.    Yes Historical Provider, MD  buPROPion (WELLBUTRIN XL) 300 MG 24 hr tablet Take 300 mg by mouth every morning.   Yes Historical Provider, MD  clonazePAM (KLONOPIN) 2 MG tablet Take 1 mg by mouth 3 (three) times daily.    Yes Historical Provider, MD  divalproex (DEPAKOTE ER) 500 MG 24 hr tablet Take 1,500 mg by mouth every morning.   Yes Historical Provider, MD  gabapentin (NEURONTIN) 300 MG capsule Take 300-600 mg by mouth See admin instructions. Take 2 capsules in the morning and 1 capsule at noon and 1 capsule at night on Sunday  then take 1 capsule three times a day on the other days   Yes Historical Provider, MD  pravastatin (PRAVACHOL) 40 MG tablet Take 80 mg by mouth at bedtime.    Yes Historical Provider, MD  oseltamivir (TAMIFLU) 75 MG capsule Take 1 capsule (75 mg total) by mouth 2 (two) times daily. Patient not taking: Reported on 04/18/2015 04/12/15   Velvet Bathe, MD   PHYSICAL EXAM: Filed Vitals:   04/18/15 1400 04/18/15 1530 04/18/15 1545 04/18/15 1600  BP: 132/66 138/74 144/76 143/84  Pulse: 65 66 65 71  Temp:      TempSrc:      Resp: _0 Height:      Weight:      SpO2: 91% 95% 96% 97%    Wt Readings from Last 3 Encounters:  04/18/15 77.111 kg (170 lb)  04/10/15 78.8 kg (173 lb 11.6 oz)  07/19/13 86.637 kg (191 lb)    General:  Pleasant white male. Appears calm and comfortable Eyes: PER, normal lids, irises & conjunctiva ENT: grossly normal hearing, lips & tongue Neck: no LAD, no masses Cardiovascular: RRR, no murmurs. No LE edema.  Respiratory: Respirations even and unlabored. Normal respiratory effort. Lungs CTA bilaterally, no wheezes / rales .   Abdomen: soft, non-distended, non-tender, active bowel sounds. No obvious masses.  Skin: no rash seen on limited exam Musculoskeletal: grossly normal tone BUE/BLE Psychiatric: grossly normal mood and affect, speech fluent and appropriate Neurologic: grossly non-focal.         LABS ON ADMISSION:    Basic Metabolic Panel:  Recent Labs Lab 04/18/15 1240  NA 139  K 4.7  CL 104  CO2 25  GLUCOSE 124*  BUN 18  CREATININE 1.30*  CALCIUM 8.3*   Liver Function Tests:  Recent Labs Lab 04/18/15 1240  AST 48*  ALT 48  ALKPHOS 302*  BILITOT 1.6*  PROT 7.0  ALBUMIN 2.7*    CBC:  Recent Labs Lab 04/12/15 0632 04/18/15 1240  WBC 4.4 9.2  NEUTROABS  --  7.2  HGB 10.9* 11.9*  HCT 34.8* 37.6*  MCV 94.8 93.5  PLT 123* 199   CREATININE: 1.3 mg/dL ABNORMAL (04/18/15 1240) Estimated creatinine clearance - 53.8  mL/min  Radiological Exams on Admission: Ct Head Wo Contrast  04/18/2015  CLINICAL DATA:  Increasing confusion with diaphoresis and lethargy. Hypotension. EXAM: CT HEAD WITHOUT CONTRAST TECHNIQUE: Contiguous axial images were obtained from the base of the skull through the vertex without intravenous contrast. COMPARISON:  Head CT 04/09/2015 and 02/18/2013. FINDINGS: Brain: There is no evidence of acute intracranial hemorrhage, mass lesion, brain  edema or extra-axial fluid collection. The ventricles and subarachnoid spaces are prominent but unchanged. There is no CT evidence of acute cortical infarction. There is stable minimal periventricular white matter disease. Bones/sinuses/visualized face: The visualized paranasal sinuses, mastoid air cells and middle ears are clear. The calvarium is intact. IMPRESSION: Stable head CT.  No acute intracranial findings. Electronically Signed   By: Richardean Sale M.D.   On: 04/18/2015 14:47   Dg Chest Port 1 View  04/18/2015  CLINICAL DATA:  Hypotension. Fever. Altered mental status. History of rectal and colon cancer. EXAM: PORTABLE CHEST 1 VIEW COMPARISON:  04/11/2015 FINDINGS: Normal heart size. Lungs clear. No pneumothorax. No pleural effusion. IMPRESSION: No active disease. Electronically Signed   By: Marybelle Killings M.D.   On: 04/18/2015 13:45    ASSESSMENT / PLAN    Hypotension. Occurred at PCP's office today (while sitting). Normotensive in ED after IVF by EMS.  Patient had not taken Norvasc today. Etiology of hypotensive episode unclear. No evidence for sepsis. -admit to Observation - Telemetry -Continue IVF -D/C antibiotics -hold home norvasc  Generalized weakness / loss of balance / confusion. Progressive weakness and balance disturbance since recent hospitalization for Influenza A. Requiring walker since discharge and now having difficulty even standing up without losing balance. No acute findings on head CTscan. Review of records shows that patient has  been hospitalized 3-4 times in last two years, each time with mention of mental status changes, gail disturbances so this could be progression of a more chronic neurologic problem or just physical deconditioning from recent illness. No history of CVA.  -Head CTscan without acute findings. -Neurology evaluation in am - I spoke with Dr. Nicole Kindred -PT, OT evaluation.   Acute kidney injury, mild. Normal BUN. Creatinine 1.3, baseline 0.95 -am BMET  Abnormal LFTs, cholestatic. Alk phos normal Jan 2015,  252 late last month and now up to 302. His T.bili is slightly elevated at 1.6. Of note, his colon cancer is remote (2001) so metastatic disease seems unlikely -abdominal u/s -am LFTs  Bipolar disorder -continue wellbutrin, klonopin, depakote -check depakote level  Hyperlipidemia -hold statin   Ascending aortic aneurysm. Measured at 4.1cm on January 2015 chest CTscan. .   History of colorectal cancer. He had rectal cancer (T3, N1) and invasive adenocarcinoma of sigmoid polyps in 2001,  s/p sigmoid colectomy with permanent colostomy. He is up to date with surveillance colonoscopy.   CONSULTANTS:  none    Code Status: Limited code -DNI DVT Prophylaxis: Lovenox Family Communication:  Patient alert, oriented and understands plan of care.  Disposition Plan: Discharge to home in 24-48 hours   Time spent: 60 minutes Tye Savoy  NP [facility] Pager [phone]

## 2015-04-19 ENCOUNTER — Inpatient Hospital Stay (HOSPITAL_COMMUNITY): Payer: PPO

## 2015-04-19 DIAGNOSIS — G20 Parkinson's disease: Principal | ICD-10-CM

## 2015-04-19 DIAGNOSIS — R7989 Other specified abnormal findings of blood chemistry: Secondary | ICD-10-CM

## 2015-04-19 DIAGNOSIS — R531 Weakness: Secondary | ICD-10-CM

## 2015-04-19 DIAGNOSIS — R740 Nonspecific elevation of levels of transaminase and lactic acid dehydrogenase [LDH]: Secondary | ICD-10-CM | POA: Diagnosis not present

## 2015-04-19 DIAGNOSIS — R29818 Other symptoms and signs involving the nervous system: Secondary | ICD-10-CM | POA: Diagnosis not present

## 2015-04-19 LAB — COMPREHENSIVE METABOLIC PANEL
ALBUMIN: 2.2 g/dL — AB (ref 3.5–5.0)
ALT: 44 U/L (ref 17–63)
ANION GAP: 9 (ref 5–15)
AST: 46 U/L — AB (ref 15–41)
Alkaline Phosphatase: 275 U/L — ABNORMAL HIGH (ref 38–126)
BUN: 13 mg/dL (ref 6–20)
CALCIUM: 8.2 mg/dL — AB (ref 8.9–10.3)
CO2: 28 mmol/L (ref 22–32)
Chloride: 106 mmol/L (ref 101–111)
Creatinine, Ser: 0.9 mg/dL (ref 0.61–1.24)
GFR calc Af Amer: >60 mL/min (ref >60)
GFR calc non Af Amer: >60 mL/min (ref >60)
GLUCOSE: 108 mg/dL — AB (ref 65–99)
Potassium: 4 mmol/L (ref 3.5–5.1)
SODIUM: 143 mmol/L (ref 135–145)
Total Bilirubin: 0.8 mg/dL (ref 0.3–1.2)
Total Protein: 5.8 g/dL — ABNORMAL LOW (ref 6.5–8.1)

## 2015-04-19 LAB — URINE CULTURE

## 2015-04-19 LAB — CBC
HEMATOCRIT: 34.6 % — AB (ref 39.0–52.0)
HEMOGLOBIN: 11.1 g/dL — AB (ref 13.0–17.0)
MCH: 29.6 pg (ref 26.0–34.0)
MCHC: 32.1 g/dL (ref 30.0–36.0)
MCV: 92.3 fL (ref 78.0–100.0)
PLATELETS: 198 10*3/uL (ref 150–400)
RBC: 3.75 MIL/uL — ABNORMAL LOW (ref 4.22–5.81)
RDW: 14 % (ref 11.5–15.5)
WBC: 5.4 10*3/uL (ref 4.0–10.5)

## 2015-04-19 MED ORDER — CARBIDOPA-LEVODOPA 25-100 MG PO TABS
0.5000 | ORAL_TABLET | Freq: Three times a day (TID) | ORAL | Status: DC
  Administered 2015-04-19 – 2015-04-21 (×7): 0.5 via ORAL
  Filled 2015-04-19 (×7): qty 1

## 2015-04-19 NOTE — NC FL2 (Signed)
[place] LEVEL OF CARE SCREENING TOOL     IDENTIFICATION  Patient Name: Ricky Conley Birthdate: [birth year] Sex: male Admission Date (Current Location): 04/18/2015  [facility] and [place] Number:  [employer] and Address:  [facility] [place] [place] [address], [place], [place] [address]      Provider Number: [id number]  Attending Physician Name and Address:  Louellen Molder, MD  Relative Name and Phone Number:  [place], [place] [phone] or [phone]    Current Level of Care: Hospital Recommended Level of Care: [place] Prior Approval Number:    Date Approved/Denied:   PASRR Number: [id number]  Discharge Plan: SNF    Current Diagnoses: Patient Active Problem List   Diagnosis Date Noted  . Hypotension 04/18/2015  . Weakness generalized 04/18/2015  . Loss of balance 04/18/2015  . Generalized weakness 04/18/2015  . Fever 04/09/2015  . Influenza-like illness 04/09/2015  . Delirium 04/09/2015  . Hypernatremia 02/18/2013  . Altered mental status 02/18/2013  . Bipolar disorder ([place]) 02/18/2013  . History of colon cancer 02/18/2013  . Colostomy in place [facility]) 02/18/2013  . Hyperlipidemia 02/18/2013  . Ascending aortic aneurysm ([place]) 08/12/2012    Orientation RESPIRATION BLADDER Height & Weight     Self, Time, Situation, Place  Normal Continent Weight: 170 lb (77.111 kg) Height:  5\' 10"  (177.8 cm)  BEHAVIORAL SYMPTOMS/MOOD NEUROLOGICAL BOWEL NUTRITION STATUS      Continent Diet (Regular Diet)  AMBULATORY STATUS COMMUNICATION OF NEEDS Skin   Limited Assist Verbally Normal                       Personal Care Assistance Level of Assistance  Bathing, Dressing Bathing Assistance: Limited assistance   Dressing Assistance: Limited assistance     Functional Limitations Info             SPECIAL CARE FACTORS FREQUENCY  PT (By licensed PT)     PT Frequency: 5x a week               Contractures      Additional Factors Info  Code Status, Allergies, Psychotropic Code Status Info: Partial Allergies Info: NKA Psychotropic Info: buPROPion, clonazePAM, divalproex         Current Medications (04/19/2015):  This is the current hospital active medication list Current Facility-Administered Medications  Medication Dose Route Frequency Provider Last Rate Last Dose  . 0.9 %  sodium chloride infusion   Intravenous Continuous Willia Craze, NP 75 mL/hr at 04/18/15 1950    . acetaminophen (TYLENOL) tablet 650 mg  650 mg Oral Q6H PRN Willia Craze, NP       Or  . acetaminophen (TYLENOL) suppository 650 mg  650 mg Rectal Q6H PRN Willia Craze, NP      . aspirin EC tablet 81 mg  81 mg Oral BH-q7a Willia Craze, NP   81 mg at 04/19/15 0914  . buPROPion (WELLBUTRIN XL) 24 hr tablet 300 mg  300 mg Oral BH-q7a Willia Craze, NP   300 mg at 04/19/15 0700  . carbidopa-levodopa (SINEMET IR) 25-100 MG per tablet immediate release 0.5 tablet  0.5 tablet Oral TID Marliss Coots, PA-C   0.5 tablet at 04/19/15 1530  . clonazePAM (KLONOPIN) tablet 1 mg  1 mg Oral TID Willia Craze, NP   1 mg at 04/19/15 1530  . divalproex (DEPAKOTE ER) 24 hr tablet 1,500 mg  1,500 mg  Oral q morning - 10a Willia Craze, NP   1,500 mg at 04/19/15 [id number]  . enoxaparin (LOVENOX) injection 40 mg  40 mg Subcutaneous Q24H Willia Craze, NP   40 mg at 04/18/15 1950  . gabapentin (NEURONTIN) capsule 300 mg  300 mg Oral 3 times per day on Mon Tue Wed Thu Fri Sat [place], [place]   300 mg at 04/19/15 1530   And  . [START ON 04/23/2015] gabapentin (NEURONTIN) capsule 600 mg  600 mg Oral Once per day on Sun [place], [facility]       And  . Derrill Memo ON 04/23/2015] gabapentin (NEURONTIN) capsule 300 mg  300 mg Oral 2 times per day on Sun [place], [facility]      . HYDROcodone-acetaminophen (NORCO/VICODIN) 5-325 MG per tablet 1-2 tablet  1-2 tablet Oral Q4H PRN Willia Craze, NP      .  ondansetron [facility]) tablet 4 mg  4 mg Oral Q6H PRN Willia Craze, NP       Or  . ondansetron [facility]) injection 4 mg  4 mg Intravenous Q6H PRN Willia Craze, NP      . senna-docusate (Senokot-S) tablet 1 tablet  1 tablet Oral QHS PRN Willia Craze, NP         Discharge Medications: Please see discharge summary for a list of discharge medications.  Relevant Imaging Results:  Relevant Lab Results:   Additional Information SSN [id number]  Ross Ludwig, [place]

## 2015-04-19 NOTE — Clinical Social Work Placement (Addendum)
   CLINICAL SOCIAL WORK PLACEMENT  NOTE  Date:  04/19/2015  Patient Details  Name: Ricky Conley MRN: [id number] Date of Birth: [birth year]  Clinical Social Work is seeking post-discharge placement for this patient at the [place] level of care (*CSW will initial, date and re-position this form in  chart as items are completed):  Yes   Patient/family provided with [facility] Work Department's list of facilities offering this level of care within the geographic area requested by the patient (or if unable, by the patient's family).  Yes   Patient/family informed of their freedom to choose among providers that offer the needed level of care, that participate in Medicare, Medicaid or managed care program needed by the patient, have an available bed and are willing to accept the patient.  Yes   Patient/family informed of [place]'s ownership interest in [facility] and [facility], as well as of the fact that they are under no obligation to receive care at these facilities.  PASRR submitted to EDS on 04/19/15     PASRR number received on 04/19/15     Existing PASRR number confirmed on       FL2 transmitted to all facilities in geographic area requested by pt/family on 04/19/15     FL2 transmitted to all facilities within larger geographic area on       Patient informed that his/her managed care company has contracts with or will negotiate with certain facilities, including the following:         04-20-15   Patient/family informed of bed offers received. (Updated Evette Cristal, MSW, LCSWA, 04/21/15)  Patient chooses bed at   [facility] (Updated Evette Cristal, MSW, LCSWA, 04/21/15)   Physician recommends and patient chooses bed at      Patient to be transferred to  [facility] on   04-21-15 (Updated Evette Cristal, MSW, LCSWA, 04/21/15)  Patient to be transferred to facility by  Patient's wife's car (Updated Evette Cristal,  MSW, LCSWA, 04/21/15)     Patient family notified on   04-21-15 of transfer. (Updated Evette Cristal, MSW, LCSWA, 04/21/15)  Name of family member notified:   Kelvyn Toelle wife was at bedside (Updated Evette Cristal, MSW, LCSWA, 04/21/15)     PHYSICIAN Please sign FL2     Additional Comment:    _______________________________________________ Ross Ludwig, LCSWA 04/19/2015, 6:59 PM

## 2015-04-19 NOTE — Progress Notes (Signed)
[facility] PROGRESS NOTE  Ricky Conley [id number] DOB: 20-Feb-1945 DOA: 04/18/2015 PCP: Shirline Frees, MD  Brief narrative 70 year old male with history of diabetes, hyperlipidemia, history of bipolar disorder, colon cancer recently hospitalized with influenza A and treated with Tamiflu was sent to the ED by PCP for an episode of hypertension with diaphoresis. EMS given IV fluids and blood pressure improved. As per wife patient has had progressive weakness after returning home from the hospital. On further questioning patient has progressive memory loss for the past 2 years, unsteady gait with shuffling for the past 1 year with frequent falls, impaired coordination for past several weeks. Since his recent hospitalization patient has required help with most of his ADLs. In the ED patient's vitals were stable. His blood pressure was actually elevated. Blood work showed hemoglobin 11.1, normal chemistry, negative lactate, normal UA and EKG. Patient admitted to hospitalist service for further management.  Assessment/Plan: Generalized weakness, unsteady gait with progressive memory loss Symptoms this on history are concerning for Parkinson's disease. Patient has had shuffling gait for several months with progressive short-term memory loss and frequent falls. Also having signs and symptoms of poor coordination.  At CT on admission was unremarkable. Appreciate neurology evaluation. Patient started on Sinemet. Seen by physical therapy and recommends skilled nursing facility.  Hypotension No clear etiology. Possibly dehydration with recent illness. Blood pressure had normalized by the time patient presented to the ED. The pressure medication currently on hold. Monitor with gentle hydration.  Acute kidney injury Possibly due to dehydration. Monitor with gentle hydration.   Abnormal LFTs Noted for mildly elevated AST and elevated alkaline phosphatase (Was 252 one month back). Total bili was  slightly elevated to 1.6. Liver ultrasound shows mild intra-and extrahepatic biliary dilatation. The caliber of the CBD appears to be similar to seen on chest CT 2 years back. LFTs appear to have improved in a.m. labs. We'll continue to monitor. Patient does not have any abdominal pain symptoms. No signs of metastatic disease (from  colon cancer).  If worsened further Will obtain MRCP. Hold statin.  Bipolar disorder Continue Wellbutrin, Depakote and Klonopin.  Ascending aortic aneurysm Last CT chest from January 2015 measures is 4.1 cm. She had missed his last appointment with thoracic surgeon. Informed his wife to reschedule an appointment.  History of colorectal cancer Status post sigmoid colectomy . Surveillance colonoscopy as outpatient.   Diet: Regular  Code Status: DO NOT INTUBATE Family Communication: Wife at bedside Disposition Plan: Skilled nursing facility possibly on 3/90. Improved.   Consultants:  Neurology  Procedures:  Head CT  Antibiotics:  None  HPI/Subjective: Seen and examined. Admission H&P reviewed.  Objective: Filed Vitals:   04/19/15 0930 04/19/15 1405  BP: 145/80 147/85  Pulse: 61 112  Temp: 97.9 F (36.6 C) 98.2 F (36.8 C)  Resp: 18 18    Intake/Output Summary (Last 24 hours) at 04/19/15 1627 Last data filed at 04/19/15 1409  Gross per 24 hour  Intake      0 ml  Output    501 ml  Net   -501 ml   Filed Weights   04/18/15 1223  Weight: 77.111 kg (170 lb)    Exam:   General:  Elderly male not in distress  HEENT: Moist mucosa  Chest: Clear bilaterally  CVS: Normal S1 and S2, no murmurs or gallop  GI: Soft, nondistended, nontender  musculoskeletal: Warm, no edema  CNS: Alert and oriented    Data Reviewed: Basic Metabolic Panel:  Recent Labs  Lab 04/18/15 1240 04/19/15 0340  NA 139 143  K 4.7 4.0  CL 104 106  CO2 25 28  GLUCOSE 124* 108*  BUN 18 13  CREATININE 1.30* 0.90  CALCIUM 8.3* 8.2*   Liver Function  Tests:  Recent Labs Lab 04/18/15 1240 04/19/15 0340  AST 48* 46*  ALT 48 44  ALKPHOS 302* 275*  BILITOT 1.6* 0.8  PROT 7.0 5.8*  ALBUMIN 2.7* 2.2*   No results for input(s): LIPASE, AMYLASE in the last 168 hours.  Recent Labs Lab 04/18/15 1608  AMMONIA 35   CBC:  Recent Labs Lab 04/18/15 1240 04/19/15 0340  WBC 9.2 5.4  NEUTROABS 7.2  --   HGB 11.9* 11.1*  HCT 37.6* 34.6*  MCV 93.5 92.3  PLT 199 198   Cardiac Enzymes: No results for input(s): CKTOTAL, CKMB, CKMBINDEX, TROPONINI in the last 168 hours. BNP (last 3 results) No results for input(s): BNP in the last 8760 hours.  ProBNP (last 3 results) No results for input(s): PROBNP in the last 8760 hours.  CBG: No results for input(s): GLUCAP in the last 168 hours.  Recent Results (from the past 240 hour(s))  Culture, blood (routine x 2)     Status: None   Collection Time: 04/09/15  9:10 PM  Result Value Ref Range Status   Specimen Description BLOOD RIGHT FOREARM  Final   Special Requests BOTTLES DRAWN AEROBIC AND ANAEROBIC 10CC   Final   Culture NO GROWTH 5 DAYS  Final   Report Status 04/14/2015 FINAL  Final  Urine culture     Status: None   Collection Time: 04/09/15  9:16 PM  Result Value Ref Range Status   Specimen Description URINE, CLEAN CATCH  Final   Special Requests NONE  Final   Culture 8,000 COLONIES/mL INSIGNIFICANT GROWTH  Final   Report Status 04/10/2015 FINAL  Final  Culture, blood (routine x 2)     Status: None   Collection Time: 04/09/15  9:19 PM  Result Value Ref Range Status   Specimen Description BLOOD RIGHT ANTECUBITAL  Final   Special Requests BOTTLES DRAWN AEROBIC AND ANAEROBIC 10CC   Final   Culture NO GROWTH 5 DAYS  Final   Report Status 04/14/2015 FINAL  Final  Culture, blood (routine x 2)     Status: None (Preliminary result)   Collection Time: 04/18/15 12:35 PM  Result Value Ref Range Status   Specimen Description BLOOD LEFT ANTECUBITAL  Final   Special Requests BOTTLES  DRAWN AEROBIC AND ANAEROBIC 5CC  Final   Culture NO GROWTH 1 DAY  Final   Report Status PENDING  Incomplete  Culture, blood (routine x 2)     Status: None (Preliminary result)   Collection Time: 04/18/15 12:45 PM  Result Value Ref Range Status   Specimen Description BLOOD LEFT ANTECUBITAL  Final   Special Requests BOTTLES DRAWN AEROBIC AND ANAEROBIC 5CC  Final   Culture NO GROWTH 1 DAY  Final   Report Status PENDING  Incomplete  Urine culture     Status: None   Collection Time: 04/18/15  2:25 PM  Result Value Ref Range Status   Specimen Description URINE, RANDOM  Final   Special Requests NONE  Final   Culture 4,000 COLONIES/mL INSIGNIFICANT GROWTH  Final   Report Status 04/19/2015 FINAL  Final     Studies: Ct Head Wo Contrast  04/18/2015  CLINICAL DATA:  Increasing confusion with diaphoresis and lethargy. Hypotension. EXAM: CT HEAD WITHOUT CONTRAST TECHNIQUE: Contiguous axial  images were obtained from the base of the skull through the vertex without intravenous contrast. COMPARISON:  Head CT 04/09/2015 and 02/18/2013. FINDINGS: Brain: There is no evidence of acute intracranial hemorrhage, mass lesion, brain edema or extra-axial fluid collection. The ventricles and subarachnoid spaces are prominent but unchanged. There is no CT evidence of acute cortical infarction. There is stable minimal periventricular white matter disease. Bones/sinuses/visualized face: The visualized paranasal sinuses, mastoid air cells and middle ears are clear. The calvarium is intact. IMPRESSION: Stable head CT.  No acute intracranial findings. Electronically Signed   By: Richardean Sale M.D.   On: 04/18/2015 14:47   US Abdomen Complete  04/19/2015  CLINICAL DATA:  Abnormal liver function studies. History of colorectal cancer. EXAM: ABDOMEN ULTRASOUND COMPLETE COMPARISON:  PET-CT 06/20/2007. Abdominal CT 06/16/2007. Chest CT 03/08/2013. FINDINGS: Gallbladder: No gallstones or wall thickening visualized. No sonographic  Murphy sign noted by sonographer. Common bile duct: Diameter: 9.7 mm. No intraductal calculus visualized. Common duct caliber is similar to prior chest CT. Liver: No focal lesions observed. The hepatic echogenicity is within normal limits. There is mild intrahepatic biliary dilatation. IVC: No abnormality visualized. Pancreas: Visualized portion unremarkable. No evidence of pancreatic ductal dilatation. Spleen: Size and appearance within normal limits. Right Kidney: Length: 12.4 cm. Small cystic lesion in the lower pole measures 1.9 cm maximally. No suspicious cortical lesion or hydronephrosis. Left Kidney: Length: 12.7 cm. Echogenicity within normal limits. No mass or hydronephrosis visualized. Abdominal aorta: No aneurysm visualized. Other findings: None. IMPRESSION: 1. Mild intra and extrahepatic biliary dilatation of undetermined etiology. The caliber of the common bile duct appears similar to chest CT performed 2 years ago. However, given the patient's elevated liver function studies and history, further evaluation recommended with abdominal CT or MRCP (preferred). 2. No specific evidence of metastatic disease. 3. Small right renal cyst. Electronically Signed   By: Richardean Sale M.D.   On: 04/19/2015 09:49   Dg Chest Port 1 View  04/18/2015  CLINICAL DATA:  Hypotension. Fever. Altered mental status. History of rectal and colon cancer. EXAM: PORTABLE CHEST 1 VIEW COMPARISON:  04/11/2015 FINDINGS: Normal heart size. Lungs clear. No pneumothorax. No pleural effusion. IMPRESSION: No active disease. Electronically Signed   By: Marybelle Killings M.D.   On: 04/18/2015 13:45    Scheduled Meds: . aspirin EC  81 mg Oral BH-q7a  . buPROPion  300 mg Oral BH-q7a  . carbidopa-levodopa  0.5 tablet Oral TID  . clonazePAM  1 mg Oral TID  . divalproex  1,500 mg Oral q morning - 10a  . enoxaparin (LOVENOX) injection  40 mg Subcutaneous Q24H  . gabapentin  300 mg Oral 3 times per day on Mon Tue Wed Thu Fri Sat   And  .  [START ON 04/23/2015] gabapentin  600 mg Oral Once per day on Sun   And  . [START ON 04/23/2015] gabapentin  300 mg Oral 2 times per day on Sun   Continuous Infusions: . sodium chloride 75 mL/hr at 04/18/15 1950      Time spent: 25 minutes    Kam Kushnir, [place]  [facility] Pager [phone]. If 7PM-7AM, please contact night-coverage at [email or web address], password [facility] 04/19/2015, 4:27 PM  LOS: 1 day

## 2015-04-19 NOTE — Clinical Social Work Note (Signed)
Clinical Social Work Assessment  Patient Details  Name: Ricky Conley MRN: [id number] Date of Birth: Jun 27, 1945  Date of referral:  04/19/15               Reason for consult:  Facility Placement                Permission sought to share information with:  Facility Sport and exercise psychologist, Family Supports Permission granted to share information::  Yes, Verbal Permission Granted  Name::     Sherif, Shareef [id number] or [phone]  Agency::  SNF admissions  Relationship::     Contact Information:     Housing/Transportation Living arrangements for the past 2 months:  Single Family Home Source of Information:  Patient, Spouse Patient Interpreter Needed:  None Criminal Activity/Legal Involvement Pertinent to Current Situation/Hospitalization:  No - Comment as needed Significant Relationships:  Spouse Lives with:  Spouse Do you feel safe going back to the place where you live?  No (Patient feel he needs some short term rehab before he can return back home) Need for family participation in patient care:  No (Coment)  Care giving concerns:  Patient's wife feels patient needs some short term rehab before he can return back home.   Social Worker assessment / plan:  Patient is a 70 year old male who is married and lives with his wife. Patient is alert and oriented x4 and able to make his own decisions.  Patient states he has never been to SNF for short term rehab.  CSW explained process and role of CSW in bed search process.  Patient's wife states she works two days a week and is worried that he will fall again while at home.  Patient's wife was explained that there is a chance that patient's insurance company may deny patient's coverage for SNF and she may have to take him home with home health.  Patient's wife states she will if she has to but does not feel comfortable taking him back home until he has received some short term rehab.  Patient's wife expressed that patient has had many falls  lately, and she is worried something worse may happen to him.  CSW provided psychosocial support to patient's wife informing her that SNF bed search will begin, CSW tried to reassure patient's wife that if patient has to go home with home health there are several pieces of equipment that can be ordered to help her with making patient safe.  Patient's wife was explained how insurance will pay for his stay if it is approved and also what to expect at SNF once patient has been disharged from hospital.  Patient's wife is hopeful that insurance will approve patient to go to SNF.  Patient's wife would like [place], [place], or [facility] for short term rehab.  Patient and his wife did not express any other other concerns or issues, and did not have any more questions.  Employment status:  Retired Nurse, adult PT Recommendations:  [place] / Referral to community resources:  [place]  Patient/Family's Response to care:  Patient and wife agreeable to SNF for short term rehab.  Patient/Family's Understanding of and Emotional Response to Diagnosis, Current Treatment, and Prognosis:  Patient's wife anxious about patient not being approved for SNF, but is aware of the possibility.  Patient and wife aware of current treatment plan and prognosis.  Emotional Assessment Appearance:  Appears younger than stated age Attitude/Demeanor/Rapport:  Affect (typically observed):  Appropriate, Calm, Pleasant, Stable Orientation:  Oriented to Self, Oriented to Place, Oriented to  Time, Oriented to Situation Alcohol / Substance use:  Not Applicable Psych involvement (Current and /or in the community):  No (Comment)  Discharge Needs  Concerns to be addressed:  No discharge needs identified Readmission within the last 30 days:  No Current discharge risk:  None Barriers to Discharge:  Insurance Authorization   Anell Barr 04/19/2015, 6:51 PM

## 2015-04-19 NOTE — Evaluation (Signed)
Occupational Therapy Evaluation Patient Details Name: Ricky Conley MRN: [id number] DOB: 05-12-1945 Today's Date: 04/19/2015    History of Present Illness Ricky Conley is a 70 y.o. male with a Past Medical History of bipolar, colorectal cancer, HLD, DM, back surgery, Ankle fx, AAA who presents with concerns for symptomatic hypertension.  Recent hospitalization for flu.   Clinical Impression   Pts wife reports that the pt has been requiring assist with ADLs since recent episode with the flu. Currently pt overall min guard-min assist for ADLs and functional mobility. Wife reports decline in cognitive and functional status over the past few months; exacerbated by recent flu episode, with multiple falls over the past year. Recommending SNF for follow up in order to maximize independence and safety with ADLs and functional mobility prior to return home. Pt would benefit from continued skilled OT to address established goals.     Follow Up Recommendations  SNF;Supervision/Assistance - 24 hour    Equipment Recommendations  Other (comment) (TBD at next venue)    Recommendations for Other Services       Precautions / Restrictions Precautions Precautions: Fall Restrictions Weight Bearing Restrictions: No      Mobility Bed Mobility Overal bed mobility: Needs Assistance Bed Mobility: Supine to Sit;Sit to Supine     Supine to sit: Min assist (to elevate trunk) Sit to supine: Min assist (at trunk for controlled descent to bed)   General bed mobility comments: VCs throughout for sequencing and scooting hips to EOB  Transfers Overall transfer level: Needs assistance Equipment used: Rolling walker (2 wheeled) Transfers: Sit to/from Stand Sit to Stand: Min assist         General transfer comment: Light min assist to boost up from EOB. VCs for hand placement    Balance Overall balance assessment: Needs assistance Sitting-balance support: Feet supported;No upper extremity  supported Sitting balance-Leahy Scale: Fair   Postural control: Posterior lean Standing balance support: No upper extremity supported;During functional activity Standing balance-Leahy Scale: Fair Standing balance comment: Pt able to stand at sink to complete grooming activities without UE support                            ADL Overall ADL's : Needs assistance/impaired Eating/Feeding: Set up;Sitting Eating/Feeding Details (indicate cue type and reason): Pts wife reports he has been able to self feed since hospital admission Grooming: Min guard;Oral care;Wash/dry hands;Standing   Upper Body Bathing: Minimal assitance;Sitting   Lower Body Bathing: Minimal assistance;Sit to/from stand   Upper Body Dressing : Minimal assistance;Sitting   Lower Body Dressing: Minimal assistance;Sit to/from stand Lower Body Dressing Details (indicate cue type and reason): Pt able to reach bilateral feet to pull socks up. Anticipate he would need assist with starting pants over feet.  Toilet Transfer: Min guard;Ambulation;BSC;RW (BSC over toilet) Toilet Transfer Details (indicate cue type and reason): Simulated by transfer from EOB         Functional mobility during ADLs: Min guard;Rolling walker General ADL Comments: Pts wife present for OT eval. Discussed pts recent decline in cognition and functional abilities; wife reports multiple falls over the past year. Discussed SNF placement for further rehab prior to return home; wife and pt agreeable.     Vision     Perception     Praxis      Pertinent Vitals/Pain Pain Assessment: Faces Pain Score: 6  Faces Pain Scale: Hurts little more Pain Location: L elbow Pain Descriptors /  Indicators: Sore Pain Intervention(s): Limited activity within patient's tolerance;Monitored during session     Hand Dominance     Extremity/Trunk Assessment Upper Extremity Assessment Upper Extremity Assessment: Overall WFL for tasks assessed   Lower  Extremity Assessment Lower Extremity Assessment: Defer to PT evaluation   Cervical / Trunk Assessment Cervical / Trunk Assessment: Kyphotic   Communication Communication Communication: HOH   Cognition Arousal/Alertness: Awake/alert Behavior During Therapy: WFL for tasks assessed/performed Overall Cognitive Status: Impaired/Different from baseline Area of Impairment: Memory;Following commands;Problem solving Orientation Level: Time   Memory: Decreased recall of precautions;Decreased short-term memory Following Commands: Follows multi-step commands inconsistently     Problem Solving: Slow processing;Requires verbal cues General Comments: Per wife pt has been having memory deficits for > 4yr and indicates he gets more confused anytime he is sick.     General Comments       Exercises       Shoulder Instructions      Home Living Family/patient expects to be discharged to:: Skilled nursing facility Living Arrangements: Spouse/significant other                           Home Equipment: Walker - 2 wheels          Prior Functioning/Environment Level of Independence: Needs assistance  Gait / Transfers Assistance Needed: normally walks independent; but since flu needed help with walker to get up has had falls x 2 at home; assist for transfers up from chair; once had to call EMS ADL's / Homemaking Assistance Needed: Sponge bath since has gotten worse but prior was getting in shower. Since flu, pt has required assist with all ADLs.        OT Diagnosis: Generalized weakness;Cognitive deficits;Acute pain;Altered mental status   OT Problem List: Impaired balance (sitting and/or standing);Decreased cognition;Decreased safety awareness;Decreased knowledge of use of DME or AE;Decreased knowledge of precautions;Pain   OT Treatment/Interventions: Self-care/ADL training;Energy conservation;DME and/or AE instruction;Therapeutic activities;Patient/family education;Balance  training    OT Goals(Current goals can be found in the care plan section) Acute Rehab OT Goals Patient Stated Goal: Per wife to improve pt's safety. OT Goal Formulation: With patient/family Time For Goal Achievement: 05/03/15 Potential to Achieve Goals: Good ADL Goals Pt Will Perform Grooming: with supervision;standing Pt Will Perform Upper Body Bathing: with supervision;sitting Pt Will Perform Lower Body Bathing: with supervision;sit to/from stand Pt Will Transfer to Toilet: with supervision;ambulating;bedside commode (over toilet) Pt Will Perform Toileting - Clothing Manipulation and hygiene: with supervision;sit to/from stand  OT Frequency: Min 2X/week   Barriers to D/C: Decreased caregiver support  wife works 2 days per week       Co-evaluation              End of Session Equipment Utilized During Treatment: Gait belt;Rolling walker  Activity Tolerance: Patient tolerated treatment well Patient left: in bed;with call bell/phone within reach;with bed alarm set;with family/visitor present;Other (comment) (with MD)   [id number] OT Time Calculation (min): 24 min Charges:  OT General Charges $OT Visit: 1 Procedure OT Evaluation $OT Eval Moderate Complexity: 1 Procedure OT Treatments $Self Care/Home Management : 8-22 mins G-Codes:     Binnie Kand M.S., OTR/L Pager: [phone]  04/19/2015, 2:54 PM

## 2015-04-19 NOTE — Evaluation (Signed)
Physical Therapy Evaluation Patient Details Name: Ricky Conley: [id number] DOB: 03-10-1945 Today's Date: 04/19/2015   History of Present Illness  Ricky Conley is a 70 y.o. male with a Past Medical History of bipolar, colorectal cancer, HLD, DM, back surgery, Ankle fx, AAA who presents with concerns for symptomatic hypertension.  Recent hospitalization for flu.  Clinical Impression  Patient presents with decreased independence with mobility due to deficits listed in PT problem list.  He will benefit from skilled PT in the acute setting to allow return home with intermittent family support following SNF level rehab stay.    Follow Up Recommendations SNF    Equipment Recommendations  None recommended by PT    Recommendations for Other Services       Precautions / Restrictions Precautions Precautions: Fall      Mobility  Bed Mobility Overal bed mobility: Needs Assistance       Supine to sit: Min assist Sit to supine: Min guard   General bed mobility comments: assist with rail and cue for technique, to supine cue for safety and positioning  Transfers Overall transfer level: Needs assistance Equipment used: Rolling walker (2 wheeled) Transfers: Sit to/from Stand Sit to Stand: Min assist         General transfer comment: cue for anterior weight shift  Ambulation/Gait Ambulation/Gait assistance: Min guard Ambulation Distance (Feet): 200 Feet Assistive device: Rolling walker (2 wheeled) Gait Pattern/deviations: Step-to pattern;Step-through pattern;Decreased stride length;Antalgic;Wide base of support;Shuffle     General Gait Details: assist for balance initially due to posterior bias, kept walker slightly shorter to allow improved anterior positioning; wide RW so improved steadiness even on turns  Stairs Stairs: Yes Stairs assistance: Min assist Stair Management: Forwards;Two rails Number of Stairs: 2 General stair comments: assist for safety  Wheelchair  Mobility    Modified Rankin (Stroke Patients Only)       Balance Overall balance assessment: Needs assistance       Postural control: Posterior lean Standing balance support: Bilateral upper extremity supported Standing balance-Leahy Scale: Poor Standing balance comment: needs UE support for balance, assist for preventing LOB posterior                             Pertinent Vitals/Pain Pain Assessment: 0-10 Pain Score: 6  Pain Location: L ankle walking without shoes Pain Descriptors / Indicators: Sore Pain Intervention(s): Monitored during session;Repositioned    Home Living Family/patient expects to be discharged to:: Private residence Living Arrangements: Spouse/significant other             Home Equipment: Environmental consultant - 2 wheels      Prior Function Level of Independence: Needs assistance   Gait / Transfers Assistance Needed: normally walks independent; but since flu needed help with walker to get up has had falls x 2 at home; assist for transfers up from chair; once had to call EMS  ADL's / Homemaking Assistance Needed: Sponge bath since has gotten worse but prior was getting in shower        Hand Dominance        Extremity/Trunk Assessment   Upper Extremity Assessment: Overall WFL for tasks assessed           Lower Extremity Assessment: Generalized weakness      Cervical / Trunk Assessment: Kyphotic  Communication   Communication: HOH  Cognition Arousal/Alertness: Awake/alert Behavior During Therapy: Impulsive Overall Cognitive Status: Impaired/Different from baseline Area of Impairment: Orientation Orientation  Level: Time   Memory: Decreased recall of precautions;Decreased short-term memory Following Commands: Follows multi-step commands inconsistently     Problem Solving: Slow processing;Requires verbal cues General Comments: Per wife pt has been having memory deficits for > 48yr and indicates he gets more confused anytime he is  sick.      General Comments General comments (skin integrity, edema, etc.): wife reports she has had to stay with him due to falls, had fallen twice since last admission and called EMS once.  States they have a dog and afraid the dog would trip him and patient won't stay put even while she runs short errands. She works two days a week 8 hours    Exercises        Assessment/Plan    PT Assessment Patient needs continued PT services  PT Diagnosis Abnormality of gait;Generalized weakness   PT Problem List Decreased strength;Decreased balance;Decreased mobility;Decreased coordination;Decreased knowledge of use of DME;Decreased safety awareness;Decreased cognition  PT Treatment Interventions DME instruction;Gait training;Balance training;Stair training;Functional mobility training;Patient/family education;Cognitive remediation;Therapeutic activities;Therapeutic exercise   PT Goals (Current goals can be found in the Care Plan section) Acute Rehab PT Goals Patient Stated Goal: Per wife to improve pt's safety. PT Goal Formulation: With patient/family Time For Goal Achievement: 04/26/15 Potential to Achieve Goals: Good    Frequency Min 3X/week   Barriers to discharge Decreased caregiver support wife needing to return to work    Co-evaluation               End of Session Equipment Utilized During Treatment: Gait belt Activity Tolerance: Patient tolerated treatment well Patient left: in bed;with call bell/phone within reach;with bed alarm set;with family/visitor present           Time: [id number] PT Time Calculation (min) (ACUTE ONLY): 36 min   Charges:   PT Evaluation $PT Eval Moderate Complexity: 1 Procedure PT Treatments $Gait Training: 8-22 mins   PT G CodesReginia Naas 2015/05/18, 1:37 PM] Magda Kiel, [place] [phone] 05-18-2015

## 2015-04-19 NOTE — Care Management Note (Signed)
Case Management Note  Patient Details  Name: Ricky Conley MRN: [id number] Date of Birth: [birth year]  Subjective/Objective:                    Action/Plan: Patient was admitted with hypotension and weakness. Lives at home with wife. Will follow for discharge needs pending PT/OT evals and physician orders.  Expected Discharge Date:                  Expected Discharge Plan:     In-House Referral:     Discharge planning Services     Post Acute Care Choice:    Choice offered to:     DME Arranged:    DME Agency:     HH Arranged:    HH Agency:     Status of Service:  In process, will continue to follow  Medicare Important Message Given:    Date Medicare IM Given:    Medicare IM give by:    Date Additional Medicare IM Given:    Additional Medicare Important Message give by:     If discussed at [place] of Stay Meetings, dates discussed:    Additional CommentsRolm Baptise, RN 04/19/2015, 1:48 PM [phone]

## 2015-04-19 NOTE — Consult Note (Addendum)
NEURO HOSPITALIST CONSULT NOTE   Requestig physician: Dr. Clementeen Graham   Reason for Consult: gait disorder   History obtained from:  Patient   And wife  HPI:                                                                                                                                          Ricky Conley is an 70 y.o. male with many year history of memory loss, "mostly short term". Per wife he has been losing keys and thing all around the house and getting lost when he drives. Over the last two years she has noted he shuffles his feet,, gets stuck when crossing door well, cannot get out of chair without rocking and using his UE and at time still unable to get out of chair. She has noted he is slow and methodical about making movements. HE has been stating he is weak over all but when asked he stated his real problem is making "his body do what he wants it to do quickly".  He was in his PCP office when he had a sudden unprovoked drop in BP which is why he was brought to hospital .   Past Medical History  Diagnosis Date  . Arthritis   . Anxiety   . Depression   . Hyperlipidemia   . Ascending aortic aneurysm ([place])   . Cancer ([place])   . Colon cancer ([place]) 2001  . Bipolar disorder ([place])   . Diet-controlled diabetes mellitus [facility])     Past Surgical History  Procedure Laterality Date  . Colonoscopy    . Back surgery  2003  . Partial colectomy  2001  . Colostomy  2001  . Fracture surgery Left 2009    ankle with hardware  . Carpal tunnel release Bilateral 2008    Family History  Problem Relation Age of Onset  . Cancer Maternal Uncle   . Cancer Brother   . Colon cancer Neg Hx      Social History:  reports that he quit smoking about 44 years ago. He quit smokeless tobacco use about 16 years ago. He reports that he does not drink alcohol or use illicit drugs.  No Known Allergies  MEDICATIONS:  Prior to Admission:  Prescriptions prior to admission  Medication Sig Dispense Refill Last Dose  . amLODipine (NORVASC) 5 MG tablet Take 1 tablet (5 mg total) by mouth daily. 30 tablet 0 04/17/2015 at Unknown time  . aspirin 81 MG EC tablet Take 81 mg by mouth every morning.    04/18/2015 at Unknown time  . buPROPion (WELLBUTRIN XL) 300 MG 24 hr tablet Take 300 mg by mouth every morning.   04/18/2015 at Unknown time  . clonazePAM (KLONOPIN) 2 MG tablet Take 1 mg by mouth 3 (three) times daily.    04/18/2015 at Unknown time  . divalproex (DEPAKOTE ER) 500 MG 24 hr tablet Take 1,500 mg by mouth every morning.   04/18/2015 at Unknown time  . gabapentin (NEURONTIN) 300 MG capsule Take 300-600 mg by mouth See admin instructions. Take 2 capsules in the morning and 1 capsule at noon and 1 capsule at night on Sunday then take 1 capsule three times a day on the other days   04/18/2015 at Unknown time  . pravastatin (PRAVACHOL) 40 MG tablet Take 80 mg by mouth at bedtime.    04/17/2015 at Unknown time  . oseltamivir (TAMIFLU) 75 MG capsule Take 1 capsule (75 mg total) by mouth 2 (two) times daily. (Patient not taking: Reported on 04/18/2015) 6 capsule 0 Completed Course at Unknown time   Scheduled: . aspirin EC  81 mg Oral BH-q7a  . buPROPion  300 mg Oral BH-q7a  . carbidopa-levodopa  0.5 tablet Oral TID  . clonazePAM  1 mg Oral TID  . divalproex  1,500 mg Oral q morning - 10a  . enoxaparin (LOVENOX) injection  40 mg Subcutaneous Q24H  . gabapentin  300 mg Oral 3 times per day on Mon Tue Wed Thu Fri Sat   And  . [START ON 04/23/2015] gabapentin  600 mg Oral Once per day on Sun   And  . [START ON 04/23/2015] gabapentin  300 mg Oral 2 times per day on Sun     ROS:                                                                                                                                       History obtained from the patient and and wife  General ROS: negative for -  chills, fatigue, fever, night sweats, weight gain or weight loss Psychological ROS: negative for - behavioral disorder, hallucinations, memory difficulties, mood swings or suicidal ideation Ophthalmic ROS: negative for - blurry vision, double vision, eye pain or loss of vision ENT ROS: negative for - epistaxis, nasal discharge, oral lesions, sore throat, tinnitus or vertigo Allergy and Immunology ROS: negative for - hives or itchy/watery eyes Hematological and Lymphatic ROS: negative for - bleeding problems, bruising or swollen lymph nodes Endocrine ROS: negative for - galactorrhea, hair pattern changes, polydipsia/polyuria or temperature intolerance Respiratory ROS: negative for - cough, hemoptysis, shortness of  breath or wheezing Cardiovascular ROS: negative for - chest pain, dyspnea on exertion, edema or irregular heartbeat Gastrointestinal ROS: negative for - abdominal pain, diarrhea, hematemesis, nausea/vomiting or stool incontinence Genito-Urinary ROS: negative for - dysuria, hematuria, incontinence or urinary frequency/urgency Musculoskeletal ROS: negative for - joint swelling or muscular weakness Neurological ROS: as noted in HPI Dermatological ROS: negative for rash and skin lesion changes   Blood pressure 145/80, pulse 61, temperature 97.9 F (36.6 C), temperature source Oral, resp. rate 18, height 5\' 10"  (1.778 m), weight 77.111 kg (170 lb), SpO2 97 %.   Neurologic Examination:                                                                                                      HEENT-  Normocephalic, no lesions, without obvious abnormality.  Normal external eye and conjunctiva.  Normal TM's bilaterally.  Normal auditory canals and external ears. Normal external nose, mucus membranes and septum.  Normal pharynx. Cardiovascular- S1, S2 normal, pulses palpable throughout   Lungs- chest clear, no wheezing, rales, normal symmetric air entry Abdomen- normal findings: bowel sounds  normal Extremities- no edema Lymph-no adenopathy palpable Musculoskeletal-no joint tenderness, deformity or swelling Skin-warm and dry, no hyperpigmentation, vitiligo, or suspicious lesions  Neurological Examination Mental Status: Alert, oriented, slightly confused with minimal confabulation.  Speech fluent without evidence of aphasia.  Able to follow 3 step commands but shows hypokinesia Cranial Nerves: II:  Visual fields grossly normal, pupils equal, round, reactive to light and accommodation III,IV, VI: ptosis not present, extra-ocular motions intact bilaterally V,VII: smile symmetric, facial light touch sensation normal bilaterally VIII: hearing normal bilaterally IX,X: uvula rises symmetrically XI: bilateral shoulder shrug XII: midline tongue extension Motor: Right : Upper extremity   5/5    Left:     Upper extremity   5/5  Lower extremity   5/5     Lower extremity   5/5 Increased tone throughout Sensory: Pinprick and light touch intact throughout, bilaterally Deep Tendon Reflexes: 2+ and symmetric throughout UE and KJ no AJ Plantars: Right: downgoing   Left: downgoing Cerebellar: normal finger-to-nose, and normal heel-to-shin test Gait: not tested      Lab Results: Basic Metabolic Panel:  Recent Labs Lab 04/18/15 1240 04/19/15 0340  NA 139 143  K 4.7 4.0  CL 104 106  CO2 25 28  GLUCOSE 124* 108*  BUN 18 13  CREATININE 1.30* 0.90  CALCIUM 8.3* 8.2*    Liver Function Tests:  Recent Labs Lab 04/18/15 1240 04/19/15 0340  AST 48* 46*  ALT 48 44  ALKPHOS 302* 275*  BILITOT 1.6* 0.8  PROT 7.0 5.8*  ALBUMIN 2.7* 2.2*   No results for input(s): LIPASE, AMYLASE in the last 168 hours.  Recent Labs Lab 04/18/15 1608  AMMONIA 35    CBC:  Recent Labs Lab 04/18/15 1240 04/19/15 0340  WBC 9.2 5.4  NEUTROABS 7.2  --   HGB 11.9* 11.1*  HCT 37.6* 34.6*  MCV 93.5 92.3  PLT 199 198    Cardiac Enzymes: No results for input(s): CKTOTAL, CKMB,  CKMBINDEX,  TROPONINI in the last 168 hours.  Lipid Panel: No results for input(s): CHOL, TRIG, HDL, CHOLHDL, VLDL, LDLCALC in the last 168 hours.  CBG: No results for input(s): GLUCAP in the last 168 hours.  Microbiology: Results for orders placed or performed during the hospital encounter of 04/09/15  Culture, blood (routine x 2)     Status: None   Collection Time: 04/09/15  9:10 PM  Result Value Ref Range Status   Specimen Description BLOOD RIGHT FOREARM  Final   Special Requests BOTTLES DRAWN AEROBIC AND ANAEROBIC 10CC   Final   Culture NO GROWTH 5 DAYS  Final   Report Status 04/14/2015 FINAL  Final  Urine culture     Status: None   Collection Time: 04/09/15  9:16 PM  Result Value Ref Range Status   Specimen Description URINE, CLEAN CATCH  Final   Special Requests NONE  Final   Culture 8,000 COLONIES/mL INSIGNIFICANT GROWTH  Final   Report Status 04/10/2015 FINAL  Final  Culture, blood (routine x 2)     Status: None   Collection Time: 04/09/15  9:19 PM  Result Value Ref Range Status   Specimen Description BLOOD RIGHT ANTECUBITAL  Final   Special Requests BOTTLES DRAWN AEROBIC AND ANAEROBIC 10CC   Final   Culture NO GROWTH 5 DAYS  Final   Report Status 04/14/2015 FINAL  Final    Coagulation Studies: No results for input(s): LABPROT, INR in the last 72 hours.  Imaging: Ct Head Wo Contrast  04/18/2015  CLINICAL DATA:  Increasing confusion with diaphoresis and lethargy. Hypotension. EXAM: CT HEAD WITHOUT CONTRAST TECHNIQUE: Contiguous axial images were obtained from the base of the skull through the vertex without intravenous contrast. COMPARISON:  Head CT 04/09/2015 and 02/18/2013. FINDINGS: Brain: There is no evidence of acute intracranial hemorrhage, mass lesion, brain edema or extra-axial fluid collection. The ventricles and subarachnoid spaces are prominent but unchanged. There is no CT evidence of acute cortical infarction. There is stable minimal periventricular white  matter disease. Bones/sinuses/visualized face: The visualized paranasal sinuses, mastoid air cells and middle ears are clear. The calvarium is intact. IMPRESSION: Stable head CT.  No acute intracranial findings. Electronically Signed   By: Richardean Sale M.D.   On: 04/18/2015 14:47   US Abdomen Complete  04/19/2015  CLINICAL DATA:  Abnormal liver function studies. History of colorectal cancer. EXAM: ABDOMEN ULTRASOUND COMPLETE COMPARISON:  PET-CT 06/20/2007. Abdominal CT 06/16/2007. Chest CT 03/08/2013. FINDINGS: Gallbladder: No gallstones or wall thickening visualized. No sonographic Murphy sign noted by sonographer. Common bile duct: Diameter: 9.7 mm. No intraductal calculus visualized. Common duct caliber is similar to prior chest CT. Liver: No focal lesions observed. The hepatic echogenicity is within normal limits. There is mild intrahepatic biliary dilatation. IVC: No abnormality visualized. Pancreas: Visualized portion unremarkable. No evidence of pancreatic ductal dilatation. Spleen: Size and appearance within normal limits. Right Kidney: Length: 12.4 cm. Small cystic lesion in the lower pole measures 1.9 cm maximally. No suspicious cortical lesion or hydronephrosis. Left Kidney: Length: 12.7 cm. Echogenicity within normal limits. No mass or hydronephrosis visualized. Abdominal aorta: No aneurysm visualized. Other findings: None. IMPRESSION: 1. Mild intra and extrahepatic biliary dilatation of undetermined etiology. The caliber of the common bile duct appears similar to chest CT performed 2 years ago. However, given the patient's elevated liver function studies and history, further evaluation recommended with abdominal CT or MRCP (preferred). 2. No specific evidence of metastatic disease. 3. Small right renal cyst. Electronically Signed   By:  Richardean Sale M.D.   On: 04/19/2015 09:49   Dg Chest Port 1 View  04/18/2015  CLINICAL DATA:  Hypotension. Fever. Altered mental status. History of rectal and  colon cancer. EXAM: PORTABLE CHEST 1 VIEW COMPARISON:  04/11/2015 FINDINGS: Normal heart size. Lungs clear. No pneumothorax. No pleural effusion. IMPRESSION: No active disease. Electronically Signed   By: Marybelle Killings M.D.   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 Patient also appears to have mild progressive cognitive decline, as well over the past 2 years. Gait apraxia cannot be ruled out as a possible contributing factor to patient's increasing difficulty with walking.  Recommendations: 1. Trial of Sinemet to 5-100 one half tablet 3 times a day 2. Physical therapy consult for gait training 3. Patient will need outpatient neurology follow-up following discharge for further management of probable Parkinson's disease.  I personally participated in this patient's evaluation and management, including formulating the above clinical assessment and management recommendations.  Rush Farmer M.D. [facility] [phone]

## 2015-04-20 DIAGNOSIS — R531 Weakness: Secondary | ICD-10-CM | POA: Diagnosis not present

## 2015-04-20 DIAGNOSIS — G20 Parkinson's disease: Secondary | ICD-10-CM | POA: Diagnosis present

## 2015-04-20 DIAGNOSIS — R29818 Other symptoms and signs involving the nervous system: Secondary | ICD-10-CM | POA: Diagnosis not present

## 2015-04-20 DIAGNOSIS — R740 Nonspecific elevation of levels of transaminase and lactic acid dehydrogenase [LDH]: Secondary | ICD-10-CM | POA: Diagnosis not present

## 2015-04-20 DIAGNOSIS — R7401 Elevation of levels of liver transaminase levels: Secondary | ICD-10-CM | POA: Diagnosis present

## 2015-04-20 DIAGNOSIS — G20A1 Parkinson's disease without dyskinesia, without mention of fluctuations: Secondary | ICD-10-CM | POA: Diagnosis present

## 2015-04-20 DIAGNOSIS — R7989 Other specified abnormal findings of blood chemistry: Secondary | ICD-10-CM | POA: Diagnosis not present

## 2015-04-20 LAB — HEPATIC FUNCTION PANEL
ALT: 16 U/L — AB (ref 17–63)
AST: 37 U/L (ref 15–41)
Albumin: 2.4 g/dL — ABNORMAL LOW (ref 3.5–5.0)
Alkaline Phosphatase: 299 U/L — ABNORMAL HIGH (ref 38–126)
BILIRUBIN INDIRECT: 0.4 mg/dL (ref 0.3–0.9)
Bilirubin, Direct: 0.4 mg/dL (ref 0.1–0.5)
TOTAL PROTEIN: 6.7 g/dL (ref 6.5–8.1)
Total Bilirubin: 0.8 mg/dL (ref 0.3–1.2)

## 2015-04-20 MED ORDER — SENNOSIDES-DOCUSATE SODIUM 8.6-50 MG PO TABS: 1.0000 | ORAL_TABLET | Freq: Every evening | ORAL | Status: DC | PRN

## 2015-04-20 MED ORDER — CARBIDOPA-LEVODOPA 25-100 MG PO TABS: 0.5000 | ORAL_TABLET | Freq: Three times a day (TID) | ORAL | Status: DC

## 2015-04-20 MED ORDER — GUAIFENESIN-DM 100-10 MG/5ML PO SYRP: 5.0000 mL | ORAL_SOLUTION | ORAL | Status: DC | PRN

## 2015-04-20 MED ORDER — CLONAZEPAM 2 MG PO TABS: 1.0000 mg | ORAL_TABLET | Freq: Three times a day (TID) | ORAL | Status: DC

## 2015-04-20 MED ORDER — GUAIFENESIN-DM 100-10 MG/5ML PO SYRP
5.0000 mL | ORAL_SOLUTION | ORAL | Status: DC | PRN
  Administered 2015-04-20 – 2015-04-21 (×2): 5 mL via ORAL
  Filled 2015-04-20 (×2): qty 5

## 2015-04-20 NOTE — Discharge Summary (Addendum)
Physician Discharge Summary  Ricky Conley [id number] DOB: 23-Oct-1945 DOA: 04/18/2015  PCP: Shirline Frees, MD  Admit date: 04/18/2015 Discharge date: 3/10//2017  Time spent: 30 minutes  Recommendations for Outpatient Follow-up:  1. Discharged to skilled nursing facility. Follow-up with outpatient neurology in 4 weeks. 2. Please follow liver enzymes as outpatient. If persistently elevated , please discontinue Depakote and Klonopin and pt will need MRCP. Please also check CEA level.   Discharge Diagnoses:  Principal Problem:   Unsteady gait   Active Problems:   Parkinson's disease (tremor, stiffness, slow motion, unstable posture) (HCC)   Altered mental status   Bipolar disorder (HCC)   History of colon cancer   Hyperlipidemia   Hypotension   Weakness generalized   Generalized weakness   Transaminitis   Discharge Condition: Fair  Diet recommendation: Heart healthy  CODE STATUS: DO NOT INTUBATE  Filed Weights   04/18/15 1223  Weight: 77.111 kg (170 lb)    History of present illness:  Please refer to admission H&P for details, in brief, 70 year old male with history of diabetes, hyperlipidemia, history of bipolar disorder, colon cancer recently hospitalized with influenza A and treated with Tamiflu was sent to the ED by PCP for an episode of hypertension with diaphoresis. EMS given IV fluids and blood pressure improved. As per wife patient has had progressive weakness after returning home from the hospital. On further questioning patient has progressive memory loss for the past 2 years, unsteady gait with shuffling for the past 1 year with frequent falls, impaired coordination for past several weeks. Since his recent hospitalization patient has required help with most of his ADLs. In the ED patient's vitals were stable. His blood pressure was actually elevated. Blood work showed hemoglobin 11.1, normal chemistry, negative lactate, normal UA and EKG. Patient admitted to  hospitalist service for further management.  Hospital Course:    Parkinson's disease  Patient has had shuffling gait for several months with progressive short-term memory loss and frequent falls . Also having signs and symptoms of poor coordination.  head CT on admission was unremarkable. Seen by neurology and suspect patient likely has Parkinson's disease. Started on Sinemet. Seen by physical therapy and recommended skilled nursing facility.  Hypotension No clear etiology. Possibly dehydration with recent illness. Blood pressure had normalized by the time patient presented to the ED. received IV fluids. BP now elevated.  Resume amlodipine upon discharge  Acute kidney injury Possibly due to dehydration. Resolved with fluids.   Abnormal LFTs Noted for mildly elevated AST and elevated alkaline phosphatase (302, Was 252 one month back). Total bili was slightly elevated to 1.6. Liver ultrasound shows mild intra-and extrahepatic biliary dilatation. The caliber of the CBD appears to be similar to seen on chest CT 2 years back. LFTs appear to have improved in a.m. labs. . Patient does not have any abdominal pain symptoms. No signs of metastatic disease (from colon cancer). -Statin discontinued. Patient is also on Depakote and Klonopin for his bipolar disorder that can contribute to some hepatic toxicity. Monitor LFTs closely as outpatient. Check CEA level as outpatient.  If LFTs persistently elevated please discontinue Depakote and Klonopin and I'll obtain MRCP.  Bipolar disorder Continue Wellbutrin, Depakote and Klonopin.  Ascending aortic aneurysm Last CT chest from January 2015 measures is 4.1 cm. She had missed his last appointment with thoracic surgeon. Informed his wife to reschedule an appointment.  History of colorectal cancer Status post sigmoid colectomy . Surveillance colonoscopy as outpatient.  Patient is hemodynamically stable.  Seen by physical therapy and recommended skilled  nursing facility. Neurology has signed off recommend outpatient follow-up with neurology.   Family Communication: Wife at bedside Disposition Plan: Skilled nursing facility    Consultants:  Neurology  Procedures:  Head CT  Antibiotics:  None  Discharge Exam: Filed Vitals:   04/20/15 0551 04/20/15 1048  BP: 158/94 147/79  Pulse: 88 64  Temp: 97.6 F (36.4 C) 98.1 F (36.7 C)  Resp: 18 18     General: Elderly male not in distress  HEENT: Moist mucosa  Chest: Clear bilaterally  CVS: Normal S1 and S2, no murmurs or gallop  GI: Soft, nondistended, nontender, has colostomy  musculoskeletal: Warm, no edema  CNS: Alert and oriented, needs assistance with ambulation  Discharge Instructions   Discharge Instructions    Ambulatory referral to Neurology    Complete by:  As directed   An appointment is requested in approximately: 4 weeks. ( parkinsons disease)          Current Discharge Medication List    START taking these medications   Details  carbidopa-levodopa (SINEMET IR) 25-100 MG tablet Take 0.5 tablets by mouth 3 (three) times daily. Qty: 90 tablet, Refills: 0    guaiFENesin-dextromethorphan (ROBITUSSIN DM) 100-10 MG/5ML syrup Take 5 mLs by mouth every 4 (four) hours as needed for cough. Qty: 118 mL, Refills: 0    senna-docusate (SENOKOT-S) 8.6-50 MG tablet Take 1 tablet by mouth at bedtime as needed for mild constipation. Qty: 10 tablet, Refills: 0      CONTINUE these medications which have CHANGED   Details  clonazePAM (KLONOPIN) 2 MG tablet Take 0.5 tablets (1 mg total) by mouth 3 (three) times daily. Qty: 30 tablet, Refills: 0      CONTINUE these medications which have NOT CHANGED   Details  amLODipine (NORVASC) 5 MG tablet Take 1 tablet (5 mg total) by mouth daily. Qty: 30 tablet, Refills: 0    aspirin 81 MG EC tablet Take 81 mg by mouth every morning.     buPROPion (WELLBUTRIN XL) 300 MG 24 hr tablet Take 300 mg by mouth every  morning.    divalproex (DEPAKOTE ER) 500 MG 24 hr tablet Take 1,500 mg by mouth every morning.    gabapentin (NEURONTIN) 300 MG capsule Take 300-600 mg by mouth See admin instructions. Take 2 capsules in the morning and 1 capsule at noon and 1 capsule at night on Sunday then take 1 capsule three times a day on the other days           STOP taking these medications     oseltamivir (TAMIFLU) 75 MG capsule   pravastatin (PRAVACHOL) 40 MG tablet     No Known Allergies Follow-up Information    Follow up In 1 week.   Why:  MD at SNF        The results of significant diagnostics from this hospitalization (including imaging, microbiology, ancillary and laboratory) are listed below for reference.    Significant Diagnostic Studies: Dg Chest 2 View  04/11/2015  CLINICAL DATA:  70 year old male with history of cough and fever since yesterday. EXAM: CHEST  2 VIEW COMPARISON:  Chest x-ray 04/09/2015. FINDINGS: Lung volumes are normal. No consolidative airspace disease. No pleural effusions. No pneumothorax. No pulmonary nodule or mass noted. Pulmonary vasculature and the cardiomediastinal silhouette are within normal limits. IMPRESSION: No radiographic evidence of acute cardiopulmonary disease. Electronically Signed   By: Vinnie Langton M.D.   On: 04/11/2015 12:53  Dg Chest 2 View  04/09/2015  CLINICAL DATA:  70 year old male with fever EXAM: CHEST  2 VIEW COMPARISON:  Chest CT dated 03/08/2013 FINDINGS: Two views of the chest do not demonstrate a focal consolidation. There is no pleural effusion or pneumothorax. There is minimal interstitial prominence throughout the left lung, likely atelectatic changes. Stable cardiac silhouette. Degenerative changes of the spine. No acute osseous pathology identified. IMPRESSION: No active cardiopulmonary disease. Electronically Signed   By: Anner Crete M.D.   On: 04/09/2015 21:14   Ct Head Wo Contrast  04/18/2015  CLINICAL DATA:  Increasing confusion  with diaphoresis and lethargy. Hypotension. EXAM: CT HEAD WITHOUT CONTRAST TECHNIQUE: Contiguous axial images were obtained from the base of the skull through the vertex without intravenous contrast. COMPARISON:  Head CT 04/09/2015 and 02/18/2013. FINDINGS: Brain: There is no evidence of acute intracranial hemorrhage, mass lesion, brain edema or extra-axial fluid collection. The ventricles and subarachnoid spaces are prominent but unchanged. There is no CT evidence of acute cortical infarction. There is stable minimal periventricular white matter disease. Bones/sinuses/visualized face: The visualized paranasal sinuses, mastoid air cells and middle ears are clear. The calvarium is intact. IMPRESSION: Stable head CT.  No acute intracranial findings. Electronically Signed   By: Richardean Sale M.D.   On: 04/18/2015 14:47   Ct Head Wo Contrast  04/09/2015  CLINICAL DATA:  Altered mental status and fever beginning today. Found down on floor today, unwitnessed fall. History of diabetes. EXAM: CT HEAD WITHOUT CONTRAST TECHNIQUE: Contiguous axial images were obtained from the base of the skull through the vertex without intravenous contrast. COMPARISON:  CT head February 18, 2013 FINDINGS: Moderate ventriculomegaly on the basis of global parenchymal brain volume loss, stable from prior examination. No intraparenchymal hemorrhage, mass effect nor midline shift. Patchy supratentorial white matter hypodensities are within less than expected for patient's age and though non-specific suggest sequelae of chronic small vessel ischemic disease. No acute large vascular territory infarcts. No abnormal extra-axial fluid collections. Basal cisterns are patent. Minimal calcific atherosclerosis of the carotid siphons. No skull fracture. The included ocular globes and orbital contents are non-suspicious. The mastoid aircells and included paranasal sinuses are well-aerated. Tube within LEFT external auditory canal. IMPRESSION: No acute  intracranial process; negative CT head for age. Electronically Signed   By: Elon Alas M.D.   On: 04/09/2015 22:40   US Abdomen Complete  04/19/2015  CLINICAL DATA:  Abnormal liver function studies. History of colorectal cancer. EXAM: ABDOMEN ULTRASOUND COMPLETE COMPARISON:  PET-CT 06/20/2007. Abdominal CT 06/16/2007. Chest CT 03/08/2013. FINDINGS: Gallbladder: No gallstones or wall thickening visualized. No sonographic Murphy sign noted by sonographer. Common bile duct: Diameter: 9.7 mm. No intraductal calculus visualized. Common duct caliber is similar to prior chest CT. Liver: No focal lesions observed. The hepatic echogenicity is within normal limits. There is mild intrahepatic biliary dilatation. IVC: No abnormality visualized. Pancreas: Visualized portion unremarkable. No evidence of pancreatic ductal dilatation. Spleen: Size and appearance within normal limits. Right Kidney: Length: 12.4 cm. Small cystic lesion in the lower pole measures 1.9 cm maximally. No suspicious cortical lesion or hydronephrosis. Left Kidney: Length: 12.7 cm. Echogenicity within normal limits. No mass or hydronephrosis visualized. Abdominal aorta: No aneurysm visualized. Other findings: None. IMPRESSION: 1. Mild intra and extrahepatic biliary dilatation of undetermined etiology. The caliber of the common bile duct appears similar to chest CT performed 2 years ago. However, given the patient's elevated liver function studies and history, further evaluation recommended with abdominal CT or MRCP (preferred).  2. No specific evidence of metastatic disease. 3. Small right renal cyst. Electronically Signed   By: Richardean Sale M.D.   On: 04/19/2015 09:49   Dg Chest Port 1 View  04/18/2015  CLINICAL DATA:  Hypotension. Fever. Altered mental status. History of rectal and colon cancer. EXAM: PORTABLE CHEST 1 VIEW COMPARISON:  04/11/2015 FINDINGS: Normal heart size. Lungs clear. No pneumothorax. No pleural effusion. IMPRESSION: No  active disease. Electronically Signed   By: Marybelle Killings M.D.   On: 04/18/2015 13:45    Microbiology: Recent Results (from the past 240 hour(s))  Culture, blood (routine x 2)     Status: None (Preliminary result)   Collection Time: 04/18/15 12:35 PM  Result Value Ref Range Status   Specimen Description BLOOD LEFT ANTECUBITAL  Final   Special Requests BOTTLES DRAWN AEROBIC AND ANAEROBIC 5CC  Final   Culture NO GROWTH 1 DAY  Final   Report Status PENDING  Incomplete  Culture, blood (routine x 2)     Status: None (Preliminary result)   Collection Time: 04/18/15 12:45 PM  Result Value Ref Range Status   Specimen Description BLOOD LEFT ANTECUBITAL  Final   Special Requests BOTTLES DRAWN AEROBIC AND ANAEROBIC 5CC  Final   Culture NO GROWTH 1 DAY  Final   Report Status PENDING  Incomplete  Urine culture     Status: None   Collection Time: 04/18/15  2:25 PM  Result Value Ref Range Status   Specimen Description URINE, RANDOM  Final   Special Requests NONE  Final   Culture 4,000 COLONIES/mL INSIGNIFICANT GROWTH  Final   Report Status 04/19/2015 FINAL  Final     Labs: Basic Metabolic Panel:  Recent Labs Lab 04/18/15 1240 04/19/15 0340  NA 139 143  K 4.7 4.0  CL 104 106  CO2 25 28  GLUCOSE 124* 108*  BUN 18 13  CREATININE 1.30* 0.90  CALCIUM 8.3* 8.2*   Liver Function Tests:  Recent Labs Lab 04/18/15 1240 04/19/15 0340 04/20/15 0315  AST 48* 46* 37  ALT 48 44 16*  ALKPHOS 302* 275* 299*  BILITOT 1.6* 0.8 0.8  PROT 7.0 5.8* 6.7  ALBUMIN 2.7* 2.2* 2.4*   No results for input(s): LIPASE, AMYLASE in the last 168 hours.  Recent Labs Lab 04/18/15 1608  AMMONIA 35   CBC:  Recent Labs Lab 04/18/15 1240 04/19/15 0340  WBC 9.2 5.4  NEUTROABS 7.2  --   HGB 11.9* 11.1*  HCT 37.6* 34.6*  MCV 93.5 92.3  PLT 199 198   Cardiac Enzymes: No results for input(s): CKTOTAL, CKMB, CKMBINDEX, TROPONINI in the last 168 hours. BNP: BNP (last 3 results) No results for  input(s): BNP in the last 8760 hours.  ProBNP (last 3 results) No results for input(s): PROBNP in the last 8760 hours.  CBG: No results for input(s): GLUCAP in the last 168 hours.     Signed:  Louellen Molder MD.  [facility] 04/20/2015, 1:06 PM

## 2015-04-20 NOTE — Progress Notes (Signed)
Subjective: pateint is awake but is not a good historian enough to tell me if he is feeling improved with his gait or movements. . When asked in multiple ways if he felt better with his walking or strength "he would state "yes I really enjoyed the walk.."  Exam: Filed Vitals:   04/20/15 0145 04/20/15 0551  BP: 172/89 158/94  Pulse: 66 88  Temp: 97.6 F (36.4 C) 97.6 F (36.4 C)  Resp: 18 18     Gen: In bed, NAD MS: alert but not fully oriented. (has known short term memory loss) CN: 2-12 intact Motor:  5/5 strength with less rigidity today Sensory: intact   Pertinent Labs: none  Etta Quill PA-C [facility] [phone]  Impression:  70 year old man with progressive gait difficulty features and clinical findings indicative of probable Parkinson's disease. Patient also appears to have mild progressive cognitive decline, as well over the past 2 years. Gait apraxia cannot be ruled out as a possible contributing factor to patient's increasing difficulty with walking.   Recommendations: 1. Continue Trial of Sinemet to 5-100 one half tablet 3 times a day 2. Patient will need outpatient neurology follow-up following discharge for further management of probable Parkinson's disease  At this time will S/O  04/20/2015, 10:17 AM

## 2015-04-20 NOTE — Clinical Social Work Note (Signed)
BSW intern has contacted patient's spouse, [name] to present bed offers. Patient's spouse has expressed interest in [facility]. BSW intern has contacted admissions coordinator, Larena Glassman at facility in reference to possibly extending a bed offer. Patient's spouse informed BSW intern that if patient is unable to d/c to [facility], then they are agreeable to accepting offer from [facility]. BSW intern to f/u with patient's spouse once clinical have been reviewed by preferred facility.  BSW intern remains available.  [place] intern [phone]

## 2015-04-20 NOTE — Progress Notes (Signed)
Physical Therapy Treatment Patient Details Name: Ricky Conley MRN: [id number] DOB: Jun 14, 1945 Today's Date: 04/20/2015    History of Present Illness Ricky Conley is a 70 y.o. male with a Past Medical History of bipolar, colorectal cancer, HLD, DM, back surgery, Ankle fx, AAA who presents with concerns for symptomatic hypertension.  Recent hospitalization for flu.    PT Comments    Asked by Theodosia Paling, PA to reassess patient's gait today due to changes made in his medications. Physical Therapist from 3/8 not available. Per her notes, pt has improved with no posterior bias noted today. Appears his shuffling was better, although as he fatigued (~140 ft) he did drag lt foot with nearly tripping. Wife arrived and observed ~45 ft of walking and she did not feel his gait looked any different.    Follow Up Recommendations  SNF     Equipment Recommendations  None recommended by PT    Recommendations for Other Services       Precautions / Restrictions Precautions Precautions: Fall Precaution Comments: multiple falls prior to admission Restrictions Weight Bearing Restrictions: No    Mobility  Bed Mobility Overal bed mobility: Needs Assistance Bed Mobility: Supine to Sit     Supine to sit: Min assist (to elevate trunk)     General bed mobility comments: VCs throughout for sequencing and scooting hips to EOB; assist to raise torso  Transfers Overall transfer level: Needs assistance Equipment used: Rolling walker (2 wheeled) Transfers: Sit to/from Stand Sit to Stand: Min guard         General transfer comment: vc for safe use of RW; close guard for safety  Ambulation/Gait Ambulation/Gait assistance: Min guard Ambulation Distance (Feet): 150 Feet Assistive device: Rolling walker (2 wheeled) Gait Pattern/deviations: Step-through pattern;Decreased stride length;Trunk flexed   Gait velocity interpretation: Below normal speed for age/gender General Gait Details: no posterior  bias today; pt complained that RW was too low (and causing him to bend forward) and elevated to regular height with no posterior bias; vc for proximity to RW; pt with dragging lt foot x 1 with near LOB   Stairs            Wheelchair Mobility    Modified Rankin (Stroke Patients Only)       Balance     Sitting balance-Leahy Scale: Fair Sitting balance - Comments: did not want to reach beyond his base of support     Standing balance-Leahy Scale: Fair                      Cognition Arousal/Alertness: Awake/alert Behavior During Therapy: WFL for tasks assessed/performed Overall Cognitive Status: Impaired/Different from baseline Area of Impairment: Memory;Following commands;Problem solving   Current Attention Level: Selective (> not tested) Memory: Decreased recall of precautions;Decreased short-term memory Following Commands: Follows multi-step commands inconsistently Safety/Judgement: Decreased awareness of safety;Decreased awareness of deficits   Problem Solving: Slow processing;Requires verbal cues General Comments: Per wife pt has been having memory deficits for > 33yr and indicates he gets more confused anytime he is sick.      Exercises      General Comments General comments (skin integrity, edema, etc.): wife arrived at end of session (observed pt walking 45 ft); she did not think his gait was significantly different      Pertinent Vitals/Pain Pain Assessment: No/denies pain (walked with sneakers)    Home Living  Prior Function            PT Goals (current goals can now be found in the care plan section) Acute Rehab PT Goals Patient Stated Goal: Per wife to improve pt's safety. Time For Goal Achievement: 04/26/15 Progress towards PT goals: Progressing toward goals    Frequency  Min 2X/week    PT Plan Current plan remains appropriate    Co-evaluation             End of Session Equipment Utilized During  Treatment: Gait belt Activity Tolerance: Patient tolerated treatment well Patient left: in chair;with call bell/phone within reach;with family/visitor present (chair alarm pad under pt; no alarm in room; RN to find one)     Time: [id number] PT Time Calculation (min) (ACUTE ONLY): 23 min  Charges:  $Gait Training: 23-37 mins                    G Codes:      Ricky Conley Apr 23, 2015, 1:19 PM Pager [phone]

## 2015-04-20 NOTE — Care Management Note (Signed)
Case Management Note  Patient Details  Name: KYDEN TARRENCE MRN: [id number] Date of Birth: 1945/02/17  Subjective/Objective:                    Action/Plan: Plan is for patient to discharge to SNF. No further needs per CM.  Expected Discharge Date:                  Expected Discharge Plan:  [place]  In-House Referral:     Discharge planning Services     Post Acute Care Choice:    Choice offered to:     DME Arranged:    DME Agency:     HH Arranged:    [place] Agency:     Status of Service:  Completed, signed off  Medicare Important Message Given:    Date Medicare IM Given:    Medicare IM give by:    Date Additional Medicare IM Given:    Additional Medicare Important Message give by:     If discussed at [place] of Stay Meetings, dates discussed:    Additional Comments:  Pollie Friar, RN 04/20/2015, 1:28 PM

## 2015-04-21 DIAGNOSIS — E119 Type 2 diabetes mellitus without complications: Secondary | ICD-10-CM | POA: Diagnosis not present

## 2015-04-21 DIAGNOSIS — E785 Hyperlipidemia, unspecified: Secondary | ICD-10-CM | POA: Diagnosis not present

## 2015-04-21 DIAGNOSIS — F419 Anxiety disorder, unspecified: Secondary | ICD-10-CM | POA: Diagnosis not present

## 2015-04-21 DIAGNOSIS — I10 Essential (primary) hypertension: Secondary | ICD-10-CM | POA: Diagnosis not present

## 2015-04-21 DIAGNOSIS — Z9181 History of falling: Secondary | ICD-10-CM | POA: Diagnosis not present

## 2015-04-21 DIAGNOSIS — Z85038 Personal history of other malignant neoplasm of large intestine: Secondary | ICD-10-CM | POA: Diagnosis not present

## 2015-04-21 DIAGNOSIS — E784 Other hyperlipidemia: Secondary | ICD-10-CM | POA: Diagnosis not present

## 2015-04-21 DIAGNOSIS — I714 Abdominal aortic aneurysm, without rupture: Secondary | ICD-10-CM | POA: Diagnosis not present

## 2015-04-21 DIAGNOSIS — R2689 Other abnormalities of gait and mobility: Secondary | ICD-10-CM | POA: Diagnosis not present

## 2015-04-21 DIAGNOSIS — D010 Carcinoma in situ of colon: Secondary | ICD-10-CM | POA: Diagnosis not present

## 2015-04-21 DIAGNOSIS — F3189 Other bipolar disorder: Secondary | ICD-10-CM | POA: Diagnosis not present

## 2015-04-21 DIAGNOSIS — G20 Parkinson's disease: Secondary | ICD-10-CM | POA: Diagnosis not present

## 2015-04-21 DIAGNOSIS — F329 Major depressive disorder, single episode, unspecified: Secondary | ICD-10-CM | POA: Diagnosis not present

## 2015-04-21 DIAGNOSIS — Z933 Colostomy status: Secondary | ICD-10-CM | POA: Diagnosis not present

## 2015-04-21 DIAGNOSIS — E039 Hypothyroidism, unspecified: Secondary | ICD-10-CM | POA: Diagnosis not present

## 2015-04-21 DIAGNOSIS — E038 Other specified hypothyroidism: Secondary | ICD-10-CM | POA: Diagnosis not present

## 2015-04-21 DIAGNOSIS — R278 Other lack of coordination: Secondary | ICD-10-CM | POA: Diagnosis not present

## 2015-04-21 DIAGNOSIS — M6281 Muscle weakness (generalized): Secondary | ICD-10-CM | POA: Diagnosis not present

## 2015-04-21 DIAGNOSIS — F319 Bipolar disorder, unspecified: Secondary | ICD-10-CM | POA: Diagnosis not present

## 2015-04-21 DIAGNOSIS — G629 Polyneuropathy, unspecified: Secondary | ICD-10-CM | POA: Diagnosis not present

## 2015-04-21 MED ORDER — AMLODIPINE BESYLATE 5 MG PO TABS
5.0000 mg | ORAL_TABLET | Freq: Every day | ORAL | Status: DC
  Administered 2015-04-21: 5 mg via ORAL
  Filled 2015-04-21: qty 1

## 2015-04-21 NOTE — Care Management Note (Signed)
Case Management Note  Patient Details  Name: Ricky Conley MRN: [id number] Date of Birth: Jul 28, 1945  Subjective/Objective:                    Action/Plan: Patient discharging to [facility] SNF today. Wife wants to transport the patient. No further needs per CM.   Expected Discharge Date:                  Expected Discharge Plan:  [place]  In-House Referral:     Discharge planning Services     Post Acute Care Choice:    Choice offered to:     DME Arranged:    DME Agency:     HH Arranged:    [place] Agency:     Status of Service:  Completed, signed off  Medicare Important Message Given:    Date Medicare IM Given:    Medicare IM give by:    Date Additional Medicare IM Given:    Additional Medicare Important Message give by:     If discussed at [place] of Stay Meetings, dates discussed:    Additional Comments:  Pollie Friar, RN 04/21/2015, 11:46 AM

## 2015-04-21 NOTE — Progress Notes (Signed)
Patient is being d/c to a nursing facility. Report called to the receiving nurse. Patient's wife is transporting.

## 2015-04-21 NOTE — Progress Notes (Signed)
[facility] PROGRESS NOTE  Ricky Conley [id number] DOB: 1945-11-20 DOA: 04/18/2015 PCP: Shirline Frees, MD  Assessment/Plan: Parkinson's disease Patient has had shuffling gait for several months with progressive short-term memory loss and frequent falls . Also having signs and symptoms of poor coordination.  At CT on admission was unremarkable. Seen by neurology and suspect patient likely has Parkinson's disease. Started on Sinemet. Seen by physical therapy and recommended skilled nursing facility.  Hypotension No clear etiology. Possibly dehydration with recent illness. Blood pressure had normalized by the time patient presented to the ED. received IV fluids. BP now elevated. Resume amlodipine upon discharge  Acute kidney injury Possibly due to dehydration. Resolved with fluids.   Abnormal LFTs Noted for mildly elevated AST and elevated alkaline phosphatase (302, Was 252 one month back). Total bili was slightly elevated to 1.6. Liver ultrasound shows mild intra-and extrahepatic biliary dilatation. The caliber of the CBD appears to be similar to seen on chest CT 2 years back. LFTs appear to have improved in a.m. labs. . Patient does not have any abdominal pain symptoms. No signs of metastatic disease (from colon cancer). -Statin discontinued. Patient is also on Depakote and Klonopin for his bipolar disorder that can contribute to some hepatic toxicity. Monitor LFTs closely as outpatient.check CEA as outpatient. If LFTs persistently elevated please discontinue Depakote and Klonopin and I'll obtain MRCP.    HPI/Subjective: Feels better overall  Objective: Filed Vitals:   04/21/15 0523 04/21/15 0932  BP: 152/92 150/71  Pulse: 63 66  Temp: 98.8 F (37.1 C) 97.3 F (36.3 C)  Resp: 18 18    Intake/Output Summary (Last 24 hours) at 04/21/15 1041 Last data filed at 04/21/15 0743  Gross per 24 hour  Intake    360 ml  Output   1025 ml  Net   -665 ml   Filed Weights   04/18/15 1223  Weight: 77.111 kg (170 lb)    Exam:    General: not in distress  HEENT: Moist mucosa  Chest: Clear bilaterally  CVS: Normal S1 and S2,  GI: Soft, nondistended, nontender, has colostomy  musculoskeletal: Warm, no edema    Data Reviewed: Basic Metabolic Panel:  Recent Labs Lab 04/18/15 1240 04/19/15 0340  NA 139 143  K 4.7 4.0  CL 104 106  CO2 25 28  GLUCOSE 124* 108*  BUN 18 13  CREATININE 1.30* 0.90  CALCIUM 8.3* 8.2*   Liver Function Tests:  Recent Labs Lab 04/18/15 1240 04/19/15 0340 04/20/15 0315  AST 48* 46* 37  ALT 48 44 16*  ALKPHOS 302* 275* 299*  BILITOT 1.6* 0.8 0.8  PROT 7.0 5.8* 6.7  ALBUMIN 2.7* 2.2* 2.4*   No results for input(s): LIPASE, AMYLASE in the last 168 hours.  Recent Labs Lab 04/18/15 1608  AMMONIA 35   CBC:  Recent Labs Lab 04/18/15 1240 04/19/15 0340  WBC 9.2 5.4  NEUTROABS 7.2  --   HGB 11.9* 11.1*  HCT 37.6* 34.6*  MCV 93.5 92.3  PLT 199 198   Cardiac Enzymes: No results for input(s): CKTOTAL, CKMB, CKMBINDEX, TROPONINI in the last 168 hours. BNP (last 3 results) No results for input(s): BNP in the last 8760 hours.  ProBNP (last 3 results) No results for input(s): PROBNP in the last 8760 hours.  CBG: No results for input(s): GLUCAP in the last 168 hours.  Recent Results (from the past 240 hour(s))  Culture, blood (routine x 2)     Status: None (Preliminary result)  Collection Time: 04/18/15 12:35 PM  Result Value Ref Range Status   Specimen Description BLOOD LEFT ANTECUBITAL  Final   Special Requests BOTTLES DRAWN AEROBIC AND ANAEROBIC 5CC  Final   Culture NO GROWTH 2 DAYS  Final   Report Status PENDING  Incomplete  Culture, blood (routine x 2)     Status: None (Preliminary result)   Collection Time: 04/18/15 12:45 PM  Result Value Ref Range Status   Specimen Description BLOOD LEFT ANTECUBITAL  Final   Special Requests BOTTLES DRAWN AEROBIC AND ANAEROBIC 5CC  Final   Culture NO  GROWTH 2 DAYS  Final   Report Status PENDING  Incomplete  Urine culture     Status: None   Collection Time: 04/18/15  2:25 PM  Result Value Ref Range Status   Specimen Description URINE, RANDOM  Final   Special Requests NONE  Final   Culture 4,000 COLONIES/mL INSIGNIFICANT GROWTH  Final   Report Status 04/19/2015 FINAL  Final     Studies: No results found.  Scheduled Meds: . amLODipine  5 mg Oral Daily  . aspirin EC  81 mg Oral BH-q7a  . buPROPion  300 mg Oral BH-q7a  . carbidopa-levodopa  0.5 tablet Oral TID  . clonazePAM  1 mg Oral TID  . divalproex  1,500 mg Oral q morning - 10a  . enoxaparin (LOVENOX) injection  40 mg Subcutaneous Q24H  . gabapentin  300 mg Oral 3 times per day on Mon Tue Wed Thu Fri Sat   And  . [START ON 04/23/2015] gabapentin  600 mg Oral Once per day on Sun   And  . [START ON 04/23/2015] gabapentin  300 mg Oral 2 times per day on Sun   Continuous Infusions:     Time spent: 15 minutes    Jerlean Peralta  [facility] Pager [phone] If 7PM-7AM, please contact night-coverage at [email or web address], password [facility] 04/21/2015, 10:41 AM  LOS: 3 days

## 2015-04-21 NOTE — Progress Notes (Signed)
Patient wheeled out of the by hospital volunteers.

## 2015-04-21 NOTE — Clinical Social Work Note (Signed)
Patient to be d/c'ed today to [facility].  Patient and family agreeable to plans will transport via personal car RN to call report [phone].  Evette Cristal, MSW, [place]

## 2015-04-21 NOTE — Care Management Important Message (Signed)
Important Message  Patient Details  Name: Ricky Conley MRN: [id number] Date of Birth: 04-Jan-1946   Medicare Important Message Given:  Yes    Barb Merino [place] 04/21/2015, 12:59 PM

## 2015-04-23 LAB — CULTURE, BLOOD (ROUTINE X 2)
CULTURE: NO GROWTH
Culture: NO GROWTH

## 2015-04-24 DIAGNOSIS — Z933 Colostomy status: Secondary | ICD-10-CM | POA: Diagnosis not present

## 2015-05-04 ENCOUNTER — Ambulatory Visit (INDEPENDENT_AMBULATORY_CARE_PROVIDER_SITE_OTHER): Payer: PPO | Admitting: Neurology

## 2015-05-04 ENCOUNTER — Other Ambulatory Visit: Payer: PPO

## 2015-05-04 ENCOUNTER — Encounter: Payer: Self-pay | Admitting: Neurology

## 2015-05-04 VITALS — BP 134/62 | HR 70 | Ht 70.0 in | Wt 161.0 lb

## 2015-05-04 DIAGNOSIS — R482 Apraxia: Secondary | ICD-10-CM | POA: Diagnosis not present

## 2015-05-04 DIAGNOSIS — F0390 Unspecified dementia without behavioral disturbance: Secondary | ICD-10-CM | POA: Diagnosis not present

## 2015-05-04 DIAGNOSIS — G9340 Encephalopathy, unspecified: Secondary | ICD-10-CM | POA: Diagnosis not present

## 2015-05-04 NOTE — Patient Instructions (Signed)
1. Your provider has requested that you have labwork completed today. Please go to [facility] ([id number]) on the second floor of this building before leaving the office today. You do not need to check in. If you are not called within 15 minutes please check with the front desk.  2. We will slowly decrease your Carbidopa Levodopa as follows: Take one tablet in the morning, one tablet in the afternoon for one week Then take one tablet in the morning for one week Then stop medication 3. We will call you to schedule Neuropsych testing in April.

## 2015-05-04 NOTE — Progress Notes (Signed)
Ricky Conley was seen today in the movement disorders clinic for neurologic consultation at the request of Shirline Frees, MD.  The consultation is for the evaluation of Parkinsons disease.  The patient is seen today after his recent hospitalizations.  I have reviewed hospital records made available to me.  He is accompanied by his wife who supplements the history.    He was hospitalized from February 26 to March 1 after mental status change, which was ultimately determined due to influenza.  He returned back to the hospital on March 7 to March 9 with increasing weakness.  He ended up seeing neurology and was felt that he possibly had Parkinson's disease or apraxia of gait.  He was given a trial of low dose levodopa (6am/2pm/10pm).  When they followed up with him the following day, he was not able to articulate whether this helped or not, primarily because the patient was a poor historian.   He does state that today that his walking is 200% better and he no longer has to use the walker.  His wife states that prior to his hospitalization he couldn't sit up on the side of the bed because he would fall over but he doesn't do that.  His wife states that about 2 years ago, he would start having trouble getting up if he was exhausted or if he was sick.   He also has a history of bipolar disorder and was on Depakote and clonazepam, which were to be monitored closely as an outpatient as the patient's liver enzymes were high when he was in the hospital.   Specific Symptoms:  Tremor: No. Family hx of similar:  No. Voice: no change Sleep: no change in sleep  Vivid Dreams:  No.  Acting out dreams:  Yes.   (rarely will sleep talk per wife) Wet Pillows: No. Postural symptoms:  Yes.    Falls?  Yes.  , wife states that had several falls but pt states that related to dehydration and flu Bradykinesia symptoms: slow movements Loss of smell:  No. Loss of taste:  No. Urinary Incontinence:  Yes.   (only  occasionally; urinary urgency) Difficulty Swallowing:  No. Handwriting, micrographia: No. Trouble with ADL's:  No.  (except needs sock puller to get socks on) Trouble buttoning clothing: Yes.   (cuff buttons) Depression:  Yes.   Memory changes:  Yes.   (wife states that it has been going on for "long time" - cannot use cell phone any longer; wife does household finances;  Wife does pillbox x 3-4 years because pt was forgetting) Hallucinations:  No.  visual distortions: No. N/V:  No. Lightheaded:  No.  Syncope: No. Diplopia:  No. Dyskinesia:  No.  Neuroimaging has 76 mi previously been performed.  It is available for my review today.  He had a CT brain on 04/18/15.  There was atrophy and small vessel disease   ALLERGIES:  No Known Allergies  CURRENT MEDICATIONS:  Outpatient Encounter Prescriptions as of 05/04/2015  Medication Sig  . amLODipine (NORVASC) 5 MG tablet Take 1 tablet (5 mg total) by mouth daily.  Marland Kitchen aspirin 81 MG EC tablet Take 81 mg by mouth every morning.   Marland Kitchen buPROPion (WELLBUTRIN XL) 300 MG 24 hr tablet Take 300 mg by mouth every morning.  . Calcium Citrate-Vitamin D (CALCIUM + D PO) Take by mouth daily.  . carbidopa-levodopa (SINEMET IR) 25-100 MG tablet Take 0.5 tablets by mouth 3 (three) times daily. (Patient taking differently: Take  1 tablet by mouth 3 (three) times daily. )  . cholecalciferol (VITAMIN D) 1000 units tablet Take 1,000 Units by mouth daily.  . clonazePAM (KLONOPIN) 2 MG tablet Take 0.5 tablets (1 mg total) by mouth 3 (three) times daily.  . divalproex (DEPAKOTE ER) 500 MG 24 hr tablet Take 1,500 mg by mouth every morning.  . gabapentin (NEURONTIN) 300 MG capsule Take by mouth See admin instructions. Take 1 in the morning, 2 in the afternoon, 1 in the evening  . guaiFENesin-dextromethorphan (ROBITUSSIN DM) 100-10 MG/5ML syrup Take 5 mLs by mouth every 4 (four) hours as needed for cough.  . Multiple Vitamin (MULTIVITAMIN) tablet Take 1 tablet by mouth  daily.  Marland Kitchen senna-docusate (SENOKOT-S) 8.6-50 MG tablet Take 1 tablet by mouth at bedtime as needed for mild constipation.   No facility-administered encounter medications on file as of 05/04/2015.    PAST MEDICAL HISTORY:   Past Medical History  Diagnosis Date  . Arthritis   . Anxiety   . Depression   . Hyperlipidemia   . Ascending aortic aneurysm ([address])   . Rectal cancer ([place])   . Colon cancer ([place]) 2001  . Bipolar disorder ([place])   . Diet-controlled diabetes mellitus ([place])     PAST SURGICAL HISTORY:   Past Surgical History  Procedure Laterality Date  . Colonoscopy    . Back surgery  2003  . Colectomy  2001  . Colostomy  2001  . Fracture surgery Left 2009    ankle with hardware  . Carpal tunnel release Bilateral 2008    SOCIAL HISTORY:   Social History   Social History  . Marital Status: Married    Spouse Name: N/A  . Number of Children: N/A  . Years of Education: N/A   Occupational History  . retired     Curator   Social History Main Topics  . Smoking status: Former Smoker    Quit date: 05/30/1970  . Smokeless tobacco: Former Systems developer    Quit date: 05/30/1998     Comment: quit chewing tobacco approx 2012  . Alcohol Use: No  . Drug Use: No  . Sexual Activity: Not on file   Other Topics Concern  . Not on file   Social History Narrative    FAMILY HISTORY:   Family Status  Relation Status Death Age  . Mother Deceased     alzheimers, HTN, depression  . Father Deceased     stroke  . Sister Alive     x2 - skin cancer  . Brother Alive     x1 - stroke  . Child Alive     2 sons, 2 daughters - alive and well    ROS:  A complete 10 system review of systems was obtained and was unremarkable apart from what is mentioned above.  PHYSICAL EXAMINATION:    VITALS:   Filed Vitals:   05/04/15 0912  BP: 134/62  Pulse: 70  Height: 5\' 10"  (1.778 m)  Weight: 161 lb (73.029 kg)    GEN:  The patient appears stated age and is in NAD. HEENT:  Normocephalic,  atraumatic.  The mucous membranes are moist. The superficial temporal arteries are without ropiness or tenderness. CV:  RRR Lungs:  CTAB Neck/HEME:  There are no carotid bruits bilaterally.  Neurological examination:  Orientation:  Montreal Cognitive Assessment  05/04/2015  Visuospatial/ Executive (0/5) 2  Naming (0/3) 3  Attention: Read list of digits (0/2) 2  Attention: Read list of letters (0/1) 1  Attention: Serial 7 subtraction starting at 100 (0/3) 1  Language: Repeat phrase (0/2) 2  Language : Fluency (0/1) 0  Abstraction (0/2) 1  Delayed Recall (0/5) 1  Orientation (0/6) 5  Total 18  Adjusted Score (based on education) 19   Cranial nerves: There is good facial symmetry. Pupils are equal round and reactive to light bilaterally. Fundoscopic exam is attempted but the disc margins are not well visualized bilaterally. Extraocular muscles are intact. The visual fields are full to confrontational testing. The speech is fluent and clear. Soft palate rises symmetrically and there is no tongue deviation. Hearing is decreased to conversational tone. Sensation: Sensation is intact to light and pinprick throughout (facial, trunk, extremities). Vibration is intact at the bilateral big toe. There is no extinction with double simultaneous stimulation. There is no sensory dermatomal level identified. Motor: Strength is 5/5 in the bilateral upper and lower extremities.   Shoulder shrug is equal and symmetric.  There is no pronator drift. Deep tendon reflexes: Deep tendon reflexes are 2-2+/4 at the bilateral biceps, triceps, brachioradialis, patella and 1/4 at the bilateral achilles. Plantar responses are downgoing bilaterally.  Movement examination: Tone: There is normal tone in the bilateral upper extremities.  The tone in the lower extremities is normal.  Abnormal movements: none Coordination:  There is no decremation with RAM's, with any form of RAMS, including alternating supination and  pronation of the forearm, hand opening and closing, finger taps, heel taps and toe taps. Gait and Station: The patient has no difficulty arising out of a deep-seated chair without the use of the hands. The patient's stride length is normal with normal stride length.  The patient has a neg pull test.    He actually puts his foot up one at a time on the chair while standing to tie his shoes and is able to balance.  No results found for: VITAMINB12  No results found for: RPR  Lab Results  Component Value Date   TSH 1.071 04/10/2015     ASSESSMENT/PLAN:  1.  Intermittent apraxia of gait  -I saw no evidence of idiopathic Parkinsons disease today.  I am not sure if he got his medication at the SNF today as his first dose is scheduled at 10AM and he was at my office by 9:15AM.  Regardless, I wouldn't expect him to look this good if he had parkinsons disease.  Pts wife reports that he has had intermittent problems walking for years when he gets sick and I suspect that this is apraxia of gait that this is probably from dementia.  I talked to the patient and wife about this today.  We are going to pursue neuropsych testing.  -I am going to slowly wean him off of levodopa.  They will certainly let me know if he gets worse.  -I will do B12, folate, rpr today.    -I am concerned about him driving and asked him to hold on that today until neuropsych is completed.   2.  F/u after above completed.  Much greater than 50% of this visit was spent in counseling and coordinating care.  Total face to face time:  60 min

## 2015-05-05 LAB — FOLATE: Folate: 20.1 ng/mL

## 2015-05-05 LAB — VITAMIN B12: Vitamin B-12: 747 pg/mL (ref 200–1100)

## 2015-05-05 LAB — SYPHILIS: RPR W/REFLEX TO RPR TITER AND TREPONEMAL ANTIBODIES, TRADITIONAL SCREENING AND DIAGNOSIS ALGORITHM

## 2015-05-08 ENCOUNTER — Other Ambulatory Visit (HOSPITAL_COMMUNITY): Payer: Self-pay | Admitting: Psychiatry

## 2015-05-17 DIAGNOSIS — E785 Hyperlipidemia, unspecified: Secondary | ICD-10-CM | POA: Diagnosis not present

## 2015-05-17 DIAGNOSIS — F0280 Dementia in other diseases classified elsewhere without behavioral disturbance: Secondary | ICD-10-CM | POA: Diagnosis not present

## 2015-05-17 DIAGNOSIS — F329 Major depressive disorder, single episode, unspecified: Secondary | ICD-10-CM | POA: Diagnosis not present

## 2015-05-17 DIAGNOSIS — E119 Type 2 diabetes mellitus without complications: Secondary | ICD-10-CM | POA: Diagnosis not present

## 2015-05-17 DIAGNOSIS — Z79899 Other long term (current) drug therapy: Secondary | ICD-10-CM | POA: Diagnosis not present

## 2015-05-19 ENCOUNTER — Other Ambulatory Visit (HOSPITAL_COMMUNITY): Payer: Self-pay | Admitting: Psychiatry

## 2015-05-22 DIAGNOSIS — F3342 Major depressive disorder, recurrent, in full remission: Secondary | ICD-10-CM | POA: Diagnosis not present

## 2015-05-23 DIAGNOSIS — Z85038 Personal history of other malignant neoplasm of large intestine: Secondary | ICD-10-CM | POA: Diagnosis not present

## 2015-05-23 DIAGNOSIS — R748 Abnormal levels of other serum enzymes: Secondary | ICD-10-CM | POA: Diagnosis not present

## 2015-05-23 DIAGNOSIS — R634 Abnormal weight loss: Secondary | ICD-10-CM | POA: Diagnosis not present

## 2015-05-29 ENCOUNTER — Telehealth: Payer: Self-pay | Admitting: *Deleted

## 2015-05-29 NOTE — Telephone Encounter (Signed)
Oncology Nurse Navigator Documentation  Oncology Nurse Navigator Flowsheets 05/29/2015  Navigator Location [facility]  Navigator Encounter Type Introductory phone call  Abnormal Finding Date 05/23/2015  Notified wife that referral has been received from [place] at [facility]. She reports his last colonoscopy was 2015 per Dr. Fuller Plan. Has had no recent scans of C/A/P. Ricky Conley is no longer in the SNF-is home now and is actually out working in the yard at time of this call. Informed her we will call with an appointment soon-working to get his records from 2001 when he had his colectomy and chemo/RT. Wife reports he had surgery and chemo/radiation afterwards. States the chemo was IV-does not recall specifics. Notified Nikki in HIM that need records from [facility] system asap--operative report and pathology; chemo records and office notes from Dr. Truddie Coco.

## 2015-05-30 ENCOUNTER — Telehealth: Payer: Self-pay | Admitting: *Deleted

## 2015-05-30 NOTE — Telephone Encounter (Signed)
Oncology Nurse Navigator Documentation  Oncology Nurse Navigator Flowsheets 05/30/2015  Navigator Location [facility]  Navigator Encounter Type Telephone  Telephone Outgoing Call  Abnormal Finding Date -  After review of referral w/Dr. Burr Medico called PCP office and inquired if they could order CT C/A/P with contrast and draw CBC, Cmet, CEA labs to expedite his workup. Tito Dine will ask Dr. Kenton Kingfisher and call back.

## 2015-05-31 ENCOUNTER — Other Ambulatory Visit: Payer: Self-pay | Admitting: Family Medicine

## 2015-05-31 ENCOUNTER — Telehealth: Payer: Self-pay | Admitting: *Deleted

## 2015-05-31 DIAGNOSIS — Z85038 Personal history of other malignant neoplasm of large intestine: Secondary | ICD-10-CM

## 2015-05-31 NOTE — Telephone Encounter (Signed)
Oncology Nurse Navigator Documentation  Oncology Nurse Navigator Flowsheets 05/31/2015  Navigator Location [facility]  Navigator Encounter Type Telephone  Telephone Outgoing Call;Patient Update  Abnormal Finding Date -  Time Spent with Patient 59  Spoke with wife (patient very HOH) and made her aware that we have the referral and are waiting for Dr. Kenton Kingfisher to order and schedule a CT scan of C/A/P. Also was informed he had labs on 4/11 at visit with Dr. Kenton Kingfisher. Provided her my direct contact information for any questions. Will provide an appointment as soon as we know when the scan will be done. Faxed message to [facility] with Dr. Kenton Kingfisher requesting the lab results be faxed and inquiring of status of the scans being scheduled.

## 2015-06-01 ENCOUNTER — Telehealth: Payer: Self-pay | Admitting: *Deleted

## 2015-06-01 NOTE — Telephone Encounter (Signed)
  Oncology Nurse Navigator Documentation  Navigator Location: [facility] (06/01/15 1045) Navigator Encounter Type: Introductory phone call (06/01/15 1045)   Spoke with patient and provided new patient appointment for 06/12/15 at 2 pm with Dr. Benay Spice. His CT scan will be on 06/08/15. Informed of location of [facility], valet service, and registration process. Reminded to bring insurance cards and a current medication list, including supplements. Patient verbalizes understanding. HIM notified to schedule appointment and welcome packet mailed to home.

## 2015-06-02 ENCOUNTER — Encounter: Payer: Self-pay | Admitting: Oncology

## 2015-06-02 ENCOUNTER — Telehealth: Payer: Self-pay | Admitting: Oncology

## 2015-06-02 NOTE — Telephone Encounter (Signed)
Contacted referring provider with appt date and time

## 2015-06-06 ENCOUNTER — Ambulatory Visit: Payer: Self-pay

## 2015-06-06 DIAGNOSIS — Z85038 Personal history of other malignant neoplasm of large intestine: Secondary | ICD-10-CM | POA: Diagnosis not present

## 2015-06-07 ENCOUNTER — Other Ambulatory Visit: Payer: Self-pay

## 2015-06-07 NOTE — Patient Outreach (Addendum)
[facility]) Care Management  06/07/2015  KAYDAN CAFFREY [birth year] [id number]   Telephonic Screening   Referral Date:  06/02/2015 Source:  [facility] / [facility]  Issue: Disease and symptom management.  [facility] lost contact with member.  Nursing Center altered mental status 3 in-patient LOS 17; SNF Comorbidity:  Cancer, Dementia, ER 2, LACE 3  Case Review: PCP:  Dr. Shirline Frees  Oncologist:  Dr. Benay Spice Neurologist:  Eustace Quail Tat, DO H/o Patient is [facility] and wife completes calls or supplements details.  H/o Colectomy, chemo and radiation for colon cancer (2001) H/o possible apraxia of gait associated to dementia. H/o Bipolar: managed with Wellbutrin, Depakote and Klonopin. H/o admissions: 2 and ED visits 0 per chart Last admission 04/18/15 - 04/21/15 Unsteady gait.  Patient discharged to [facility].   Outreach call #1 to patient.  Patient or wife, [name] not reached.  RN CM left HIPAA compliant voice message with name and call back #.  RN CM will schedule next outreach call within one week.   Mariann Laster, RN, BSN, [facility], CCM  [facility] [employer] Management Coordinator [phone] Direct [phone] Cell [phone] Office [phone] Fax

## 2015-06-08 ENCOUNTER — Ambulatory Visit
Admission: RE | Admit: 2015-06-08 | Discharge: 2015-06-08 | Disposition: A | Discharge status: Home / Self Care | Payer: PPO | Source: Ambulatory Visit | Attending: Family Medicine | Admitting: Family Medicine

## 2015-06-08 DIAGNOSIS — I712 Thoracic aortic aneurysm, without rupture: Secondary | ICD-10-CM | POA: Diagnosis not present

## 2015-06-08 DIAGNOSIS — R634 Abnormal weight loss: Secondary | ICD-10-CM | POA: Diagnosis not present

## 2015-06-08 DIAGNOSIS — Z85038 Personal history of other malignant neoplasm of large intestine: Secondary | ICD-10-CM

## 2015-06-08 MED ORDER — IOPAMIDOL (ISOVUE-300) INJECTION 61%
100.0000 mL | Freq: Once | INTRAVENOUS | Status: AC | PRN
  Administered 2015-06-08: 100 mL via INTRAVENOUS

## 2015-06-09 ENCOUNTER — Ambulatory Visit: Payer: PPO

## 2015-06-09 ENCOUNTER — Encounter: Payer: Self-pay | Admitting: Gastroenterology

## 2015-06-09 ENCOUNTER — Ambulatory Visit: Payer: Self-pay

## 2015-06-09 ENCOUNTER — Ambulatory Visit (INDEPENDENT_AMBULATORY_CARE_PROVIDER_SITE_OTHER): Payer: PPO | Admitting: Gastroenterology

## 2015-06-09 ENCOUNTER — Telehealth: Payer: Self-pay | Admitting: Gastroenterology

## 2015-06-09 ENCOUNTER — Other Ambulatory Visit (INDEPENDENT_AMBULATORY_CARE_PROVIDER_SITE_OTHER): Payer: PPO

## 2015-06-09 ENCOUNTER — Other Ambulatory Visit: Payer: Self-pay | Admitting: Gastroenterology

## 2015-06-09 VITALS — BP 110/64 | HR 80 | Ht 70.0 in | Wt 157.4 lb

## 2015-06-09 DIAGNOSIS — R9389 Abnormal findings on diagnostic imaging of other specified body structures: Secondary | ICD-10-CM

## 2015-06-09 DIAGNOSIS — R938 Abnormal findings on diagnostic imaging of other specified body structures: Secondary | ICD-10-CM

## 2015-06-09 DIAGNOSIS — R7989 Other specified abnormal findings of blood chemistry: Secondary | ICD-10-CM | POA: Diagnosis not present

## 2015-06-09 DIAGNOSIS — R945 Abnormal results of liver function studies: Secondary | ICD-10-CM

## 2015-06-09 DIAGNOSIS — R978 Other abnormal tumor markers: Secondary | ICD-10-CM | POA: Diagnosis not present

## 2015-06-09 LAB — CBC WITH DIFFERENTIAL/PLATELET
BASOS ABS: 0 10*3/uL (ref 0.0–0.1)
BASOS PCT: 0.4 % (ref 0.0–3.0)
EOS ABS: 0 10*3/uL (ref 0.0–0.7)
Eosinophils Relative: 0.4 % (ref 0.0–5.0)
HCT: 35.5 % — ABNORMAL LOW (ref 39.0–52.0)
Hemoglobin: 11.8 g/dL — ABNORMAL LOW (ref 13.0–17.0)
LYMPHS ABS: 1.1 10*3/uL (ref 0.7–4.0)
LYMPHS PCT: 13.6 % (ref 12.0–46.0)
MCHC: 33.4 g/dL (ref 30.0–36.0)
MCV: 90.3 fl (ref 78.0–100.0)
MONO ABS: 0.9 10*3/uL (ref 0.1–1.0)
Monocytes Relative: 11.7 % (ref 3.0–12.0)
NEUTROS ABS: 5.8 10*3/uL (ref 1.4–7.7)
NEUTROS PCT: 73.9 % (ref 43.0–77.0)
PLATELETS: 162 10*3/uL (ref 150.0–400.0)
RBC: 3.93 Mil/uL — ABNORMAL LOW (ref 4.22–5.81)
RDW: 16.4 % — AB (ref 11.5–15.5)
WBC: 7.9 10*3/uL (ref 4.0–10.5)

## 2015-06-09 LAB — HEPATIC FUNCTION PANEL
ALT: 65 U/L — AB (ref 0–53)
AST: 104 U/L — ABNORMAL HIGH (ref 0–37)
Albumin: 3.2 g/dL — ABNORMAL LOW (ref 3.5–5.2)
Alkaline Phosphatase: 345 U/L — ABNORMAL HIGH (ref 39–117)
BILIRUBIN DIRECT: 3.3 mg/dL — AB (ref 0.0–0.3)
BILIRUBIN TOTAL: 4.5 mg/dL — AB (ref 0.2–1.2)
Total Protein: 6.8 g/dL (ref 6.0–8.3)

## 2015-06-09 LAB — PROTIME-INR
INR: 1.4 ratio — AB (ref 0.8–1.0)
PROTHROMBIN TIME: 14.3 s — AB (ref 9.6–13.1)

## 2015-06-09 LAB — LIPASE: Lipase: 19 U/L (ref 11.0–59.0)

## 2015-06-09 NOTE — Progress Notes (Signed)
     06/09/2015 Ricky Conley [id number] Jul 31, 1945   History of Present Illness:  This is a 70 year old male who has history of colon cancer in 2001 requiring surgery with colostomy, chemotherapy, radiation. He follows regularly with Dr. Fuller Plan for his colonoscopies and last was in 2015. He presents to our office today at the request of his PCP for evaluation regarding recent elevated LFTs and now an abnormal CT scan. The patient apparently was found have some abnormal LFTs back in March. Ultrasound of the abdomen showed mild intra-and extrahepatic biliary dilatation of undetermined etiology, but the common bile duct caliber seemed similar to a chest CT 2 years prior. Due to elevation of LFTs it was recommended he have a CT scan performed, however. This was just done yesterday of the abdomen and pelvis with contrast. This revealed biliary obstruction due to 1.6 cm high density structure in the vicinity of the ampulla possibly large gallstone but ampullary tumors not totally excluded. There is also something high-density material centrally in the common bile duct possibly blood products or other gallstones. He also has some enlarged lymph nodes in the peripancreatic/porta hepatis region. The patient denies any abdominal pain, fever, chills, etc. He does have some memory issues, but his wife is present and corroborates his story. The last labs that we have from his PCP are from April 11, which revealed an alkaline phosphatase of 368, ALT 47, AST 48, total bilirubin 1.3 with a direct bilirubin of 0.7.  CEA level was drawn and was 1.2.   Current Medications, Allergies, Past Medical History, Past Surgical History, Family History and Social History were reviewed in [employer] record.   Physical Exam: BP 110/64 mmHg  Pulse 80  Ht 5\' 10"  (1.778 m)  Wt 157 lb 6 oz (71.385 kg)  BMI 22.58 kg/m2 General: Well developed white male in no acute distress Head: Normocephalic and  atraumatic Eyes:  Sclerae mildly icteric. Ears: HOH. Lungs: Clear throughout to auscultation Heart: Regular rate and rhythm Abdomen: Soft, non-distended.  Normal bowel sounds.  Non-tender.  Colostomy in place. Musculoskeletal: Symmetrical with no gross deformities  Extremities: No edema  Neurological: Alert oriented x 4, grossly non-focal Psychological:  Alert and cooperative. Normal mood and affect  Assessment and Recommendations: -70 year old male with elevated LFTs and CT scan showing biliary obstruction with a 1.6 cm structure in the vicinity of the ampulla question stone versus ampullary tumor. I discussed with Dr. Fuller Plan. We will check MRI/MRCP. We will check updated labs including CBC, CMP, PT/INR, lipase. I will also check a CEA and a CA-19-9. He is scheduled to see Dr. Benay Spice on Monday. I will also forward this to Dr. Ardis Hughs for review in case he would need possible EUS/ERCP. Currently he is asymptomatic with no complaints of abdominal pain, fever, etc. -Personal history of colon cancer in 2001 with surgery, chemotherapy, radiation. Has colostomy in place. Colonoscopy up-to-date with Dr. Fuller Plan.

## 2015-06-09 NOTE — Telephone Encounter (Signed)
Patient will come in today at 11:00 to see Alonza Bogus, PA.  Tito Dine will notify patient

## 2015-06-09 NOTE — Progress Notes (Signed)
I agree, let me know what the MR shows  thanks

## 2015-06-09 NOTE — Telephone Encounter (Signed)
Caller name: Tito Dine Relation to pt: Referral [facility] - Dr. Jeanette Caprice [facility] Call back number: [phone]     Reason for call:   Dr. Fuller Plan patient --- Tito Dine with Dr. Kenton Kingfisher calling with urgent referral - 1.6 cm density bilary obstruction seen on CT.  Please call [facility].

## 2015-06-09 NOTE — Patient Instructions (Signed)
Please go to the basement level to have your labs drawn.   You have been scheduled for an MRI/MRCP at [facility] building on 06-13-2015 Your appointment time is 9:45 am. Please arrive at 9:30 am to your appointment time for registration purposes. Please make certain not to have anything to eat or drink 6 hours prior to your test. In addition, if you have any metal in your body, have a pacemaker or defibrillator, please be sure to let your ordering physician know. This test typically takes 45 minutes to 1 hour to complete.

## 2015-06-09 NOTE — Progress Notes (Signed)
Reviewed and management plan was discussed with Alonza Bogus, PAC. Await MRCP and blood work. Will likely need ERCP or EUS/ERCP.   Pricilla Riffle. Fuller Plan, MD [facility]

## 2015-06-10 LAB — CANCER ANTIGEN 19-9: CA 19 9: 60 U/mL — AB (ref <34)

## 2015-06-12 ENCOUNTER — Encounter: Payer: Self-pay | Admitting: *Deleted

## 2015-06-12 ENCOUNTER — Encounter: Payer: Self-pay | Admitting: Oncology

## 2015-06-12 ENCOUNTER — Ambulatory Visit (HOSPITAL_BASED_OUTPATIENT_CLINIC_OR_DEPARTMENT_OTHER): Payer: PPO | Admitting: Oncology

## 2015-06-12 VITALS — BP 148/74 | HR 71 | Temp 98.1°F | Resp 18 | Ht 70.0 in | Wt 159.5 lb

## 2015-06-12 DIAGNOSIS — R630 Anorexia: Secondary | ICD-10-CM

## 2015-06-12 DIAGNOSIS — R4182 Altered mental status, unspecified: Secondary | ICD-10-CM

## 2015-06-12 DIAGNOSIS — R634 Abnormal weight loss: Secondary | ICD-10-CM | POA: Diagnosis not present

## 2015-06-12 DIAGNOSIS — K831 Obstruction of bile duct: Secondary | ICD-10-CM

## 2015-06-12 DIAGNOSIS — R1909 Other intra-abdominal and pelvic swelling, mass and lump: Secondary | ICD-10-CM

## 2015-06-12 DIAGNOSIS — Z933 Colostomy status: Secondary | ICD-10-CM | POA: Diagnosis not present

## 2015-06-12 DIAGNOSIS — C20 Malignant neoplasm of rectum: Secondary | ICD-10-CM | POA: Insufficient documentation

## 2015-06-12 DIAGNOSIS — F319 Bipolar disorder, unspecified: Secondary | ICD-10-CM

## 2015-06-12 NOTE — Progress Notes (Signed)
[name] New Patient Consult   Referring MD: Shirline Frees, Md [address] [place], [place] [address]   Ricky Conley 70 y.o.  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 He underwent an APR for a T3 N1 rectal cancer on 05/08/1999. He was treated with adjuvant chemotherapy/radiation.He last underwent a colonoscopy by Dr. Fuller Plan on 07/19/2013. Since cell polyps were removed from the ascending and transverse colon. The pathology revealed tubular adenomas. Biopsies of a nodule at the colostomy revealed inflamed colonic mucosa with no evidence of malignancy.  Ricky Conley presents today with his wife. She reports he has developed intermittent falls and memory loss since the summer of 2016. There has been an associated 50 pound weight loss over the past year. He was admitted in February with a flulike  Illness an altered mental status. He was readmitted with altered mental status in March. He was noted to have an unsteady gait. He was started on treatment for Parkinson's disease.  Ricky Conley was referred to Dr. Carles Collet and she felt he did not have Parkinson's disease. He was taken off of levodopa and referred for a neuro psychiatric evaluation.  When he saw Dr. Kenton Kingfisher on 05/23/2015 the liver enzymes were mildly elevated. The AST returned at 48, ALT 47, bilirubin 1.3 with a direct bilirubin of 0.7.  He was referred for CTs of the chest, abdomen, and pelvis on 06/08/2015. A right internal mammary node measured 0.67 m compared to a previous measurement of 0.3 cm on 03/08/2013. The lungs appeared unremarkable. Intrahepatic biliary dilatation was noted with a dilated common bile duct.  An abnormal increased density was noted in the distal common bile duct near the ampulla measuring 1.67 m, not present on a CT from 2009. Presacral stranding and soft tissue density with a  presacral fluid collection is unchanged from 2009. No evidence of local recurrence. There is a 1.1 cm peripancreatic node and a 1.2 cm portacaval node.  He was referred to gastroenterology and is scheduled for an MRCP tomorrow.   Past Medical History  Diagnosis Date  . Arthritis   . Anxiety   . Depression   . Hyperlipidemia   . Ascending aortic aneurysm ([place])   .    Marland Kitchen  Rectal cancer 2001  . Bipolar disorder ([place])   . Diet-controlled diabetes mellitus [facility])     Past Surgical History  Procedure Laterality Date  . Colonoscopy   2015  . Back surgery  2003  . Colectomy  2001  . Colostomy  2001  . Fracture surgery Left 2009    ankle with hardware  . Carpal tunnel release Bilateral 2008    Medications: Reviewed  Allergies: No Known Allergies  Family history:  No family history of rectal cancer. His sister had "cancer "on the leg, he cannot be more specific. He has 4 children.  Social History:    He lives with his wife in [place]. He is retired Curator. He does not use cigarettes or alcohol. No transfusion history. No risk factor for HIV or hepatitis. He has been a blood donor.  History  Alcohol Use No    History  Smoking status  . Former Smoker  . Quit date: 05/30/1970  Smokeless tobacco  . Former Systems developer  . Quit date: 05/30/1998    Comment: quit chewing tobacco approx 2012  ROS:   Positives include: Anorexia, 50 pound weight loss over the past year, intermittent falls starting in the summer of 2016, short-term memory loss, confusion, occasional high fever-last on 06/08/2015-104 degrees  A complete ROS was otherwise negative.  Physical Exam:  Blood pressure 148/74, pulse 71, temperature 98.1 F (36.7 C), temperature source Oral, resp. rate 18, height 5\' 10"  (1.778 m), weight 159 lb 8 oz (72.349 kg), SpO2 100 %.  HEENT:  Upper and lower denture  Plate, oropharynx without visible mass, neck without mass , scleral icterus Lungs:  Clear  bilaterally Cardiac:  Regular rate and rhythm Abdomen:  No hepatomegaly, no mass, nontender, no apparent ascites, left lower quadrant colostomy GU:  Uncircumcised male, testes without mass  Vascular:   No leg edema Lymph nodes:  No cervical, supraclavicular, axillary, or inguinal nodes Neurologic:  Alert, oriented to place, not year, difficulty with short-term memory recall, finger to nose testing is normal, the gait is normal Skin:  No rash, perineal scar without evidence of recurrent tumor, jaundice Musculoskeletal:  No spine tenderness   LAB:  CBC  Lab Results  Component Value Date   WBC 7.9 06/09/2015   HGB 11.8* 06/09/2015   HCT 35.5* 06/09/2015   MCV 90.3 06/09/2015   PLT 162.0 06/09/2015   NEUTROABS 5.8 06/09/2015     CMP      Component Value Date/Time   NA 143 04/19/2015 0340   K 4.0 04/19/2015 0340   CL 106 04/19/2015 0340   CO2 28 04/19/2015 0340   GLUCOSE 108* 04/19/2015 0340   BUN 13 04/19/2015 0340   CREATININE 0.90 04/19/2015 0340   CALCIUM 8.2* 04/19/2015 0340   PROT 6.8 06/09/2015 1212   ALBUMIN 3.2* 06/09/2015 1212   AST 104* 06/09/2015 1212   ALT 65* 06/09/2015 1212   ALKPHOS 345* 06/09/2015 1212   BILITOT 4.5* 06/09/2015 1212   GFRNONAA >60 04/19/2015 0340   GFRAA >60 04/19/2015 0340     Imaging:   CT images from 06/08/2015-reviewed   Assessment/Plan:   1.  obstructive jaundice   CT 06/08/2015 revealed a 1.6 and your lesion in the vicinity of the ampulla with slight interval enlargement of an internal mammary lymph node and peripancreatic/porta hepatis nodes  2.     Rectal cancer-stage IIIB (T3 N1), status post an APR 05/08/1999, 3/7 lymph nodes positive with discontinuous tumor nodules in the perirectal fat, followed by adjuvant chemotherapy/radiation  3.     Bipolar disease  4.     Altered mental status-dementia?  5.      Anorexia/weight loss   Disposition:    Ricky Conley has obstructive jaundice. This occurs in the setting of  anorexia/weight loss. He may have an ampullary, small bowel, or pancreatic tumor. Alternatively the biliary obstruction could be related to a gallstone.   The anorexia/weight loss is most likely secondary to malignancy, but  Dementia could also be contributing.  He appears to have significant dementia. He is scheduled for neuropsychiatric testing next week.  Ricky Conley is scheduled for an MRCP tomorrow. I will present his case at the GI tumor conference on 06/14/2015. We will arrange for appropriate follow-up based on the MRCP and conference discussion.   Approximately 50 minutes were spent with the patient today. The majority of the time was used for counseling and coordination of care.  Betsy Coder, MD  06/12/2015, 3:41 PM

## 2015-06-12 NOTE — Patient Instructions (Signed)
   Care Plan Summary- 06/12/2015 Name:  Ricky Conley       DOB:  March 31, 1945 Your Medical Team: Medical Oncologist:  Dr. Ma Rings Radiation Oncologist:   Surgeon:    Type of Cancer: Undetermined at this time  Stage/Grade: Undetermined *Exact staging of your cancer is based on size of the tumor, depth of invasion, involvement of lymph nodes or not, and whether or not the cancer has spread beyond the primary site   Recommendations: Based on information available as of today's consult. Recommendations may change depending on the results of further tests or exams. 1) Proceed with MRCP and appointment with Dr. Carles Collet 2) We will discuss your case in GI Conference 06/14/15 3) Return to Dr. Benay Spice based on result of biopsy Next Steps: 1) Per Dr. Benay Spice: You should not drive or operate heavy machinery-give your wife your keys 2) Get up slowly and walk slowly to avoid falls 3) Nurse will call you for follow up appointment ______________________________________________________________________________   Questions? [name], RN, BSN at [phone]. [name] is your Oncology Nurse Navigator and is available to assist you while you're receiving your medical care at [facility].

## 2015-06-12 NOTE — Progress Notes (Signed)
Oncology Nurse Navigator Documentation  Oncology Nurse Navigator Flowsheets 06/12/2015  Navigator Location [facility]-Med Onc  Navigator Encounter Type Initial MedOnc  Telephone -  Abnormal Finding Date -  Patient Visit Type MedOnc;Initial  Treatment Phase Abnormal Scans  Barriers/Navigation Needs Education;Coordination of Care  Education Accessing Care/ Finding Providers;Other--necessity of MRCP and probable need for EUS  Interventions Coordination of Care--added case to GI Cancer Conference for 06/14/15  Coordination of Care Other  Acuity Level 2  Time Spent with Patient 60  Met with patient and wife, [name] during new patient visit. Explained the role of the GI Nurse Navigator. Answered questions, reviewed current treatment plan using TEACH back and provided emotional support. Provided copy of current treatment plan. Patient is very pleasant, but confused. Gait is slightly unsteady and has history of many falls. Seeing neurologist for testing on 06/20/15. Stressed to patient that he should not drive or operate heavy machinery and should give car keys to his wife. Will follow up based on pathology.  [name], RN, BSN GI Oncology [place]

## 2015-06-13 ENCOUNTER — Other Ambulatory Visit: Payer: Self-pay

## 2015-06-13 ENCOUNTER — Other Ambulatory Visit: Payer: Self-pay | Admitting: Gastroenterology

## 2015-06-13 ENCOUNTER — Ambulatory Visit (HOSPITAL_COMMUNITY)
Admission: RE | Admit: 2015-06-13 | Discharge: 2015-06-13 | Disposition: A | Discharge status: Home / Self Care | Payer: PPO | Source: Ambulatory Visit | Attending: Gastroenterology | Admitting: Gastroenterology

## 2015-06-13 ENCOUNTER — Ambulatory Visit: Payer: Self-pay

## 2015-06-13 DIAGNOSIS — R938 Abnormal findings on diagnostic imaging of other specified body structures: Secondary | ICD-10-CM | POA: Diagnosis not present

## 2015-06-13 DIAGNOSIS — R7989 Other specified abnormal findings of blood chemistry: Secondary | ICD-10-CM | POA: Insufficient documentation

## 2015-06-13 DIAGNOSIS — R945 Abnormal results of liver function studies: Secondary | ICD-10-CM

## 2015-06-13 DIAGNOSIS — R9389 Abnormal findings on diagnostic imaging of other specified body structures: Secondary | ICD-10-CM

## 2015-06-13 DIAGNOSIS — K8050 Calculus of bile duct without cholangitis or cholecystitis without obstruction: Secondary | ICD-10-CM

## 2015-06-13 DIAGNOSIS — K8051 Calculus of bile duct without cholangitis or cholecystitis with obstruction: Secondary | ICD-10-CM | POA: Diagnosis not present

## 2015-06-13 DIAGNOSIS — R935 Abnormal findings on diagnostic imaging of other abdominal regions, including retroperitoneum: Secondary | ICD-10-CM | POA: Diagnosis not present

## 2015-06-13 DIAGNOSIS — R590 Localized enlarged lymph nodes: Secondary | ICD-10-CM | POA: Diagnosis not present

## 2015-06-13 LAB — POCT I-STAT CREATININE: Creatinine, Ser: 0.8 mg/dL (ref 0.61–1.24)

## 2015-06-13 MED ORDER — GADOBENATE DIMEGLUMINE 529 MG/ML IV SOLN
15.0000 mL | Freq: Once | INTRAVENOUS | Status: AC | PRN
  Administered 2015-06-13: 15 mL via INTRAVENOUS

## 2015-06-14 ENCOUNTER — Telehealth: Payer: Self-pay | Admitting: *Deleted

## 2015-06-14 ENCOUNTER — Ambulatory Visit: Payer: Self-pay

## 2015-06-14 NOTE — Telephone Encounter (Signed)
  Oncology Nurse Navigator Documentation  Navigator Location: [facility] Onc (06/14/15 1015) Navigator Encounter Type: Telephone (06/14/15 1015) Telephone: Outgoing Call;Patient Update (06/14/15 1015)  Notified wife that mass was determined to be a stone in CBD, so it is not cancer. GI will be setting him for up for an ERCP in next 1-2 weeks. We are here for them for any future oncology needs.

## 2015-06-15 ENCOUNTER — Other Ambulatory Visit: Payer: Self-pay

## 2015-06-15 VITALS — BP 148/74 | Ht 70.0 in | Wt 160.0 lb

## 2015-06-15 DIAGNOSIS — F411 Generalized anxiety disorder: Secondary | ICD-10-CM | POA: Insufficient documentation

## 2015-06-15 NOTE — Patient Outreach (Signed)
[facility]) Care Management  06/15/2015  IZAEAH HLAVAC [birth year] FY:[id number]  Telephonic Screening   Referral Date: 06/02/2015 Source: [facility] Rossetti / [facility]  Issue: Disease and symptom management. [facility] lost contact with member. [place] altered mental status 3 in-patient LOS 17; SNF Comorbidity: Cancer, Dementia, ER 2, LACE 3  Outreach call #2 to patient's wife, Artyom Boomer.  Contact reached and Telephone Screen and Initial Assessment completed.    Providers: Primary MD: Dr. Shirline Frees   -  last appt: 05/2015     next appt:  Scheduled but unable to recall the date.    Oncologist: Dr. Benay Spice 06/12/15 Neurologist: Eustace Quail Tat, DO HH: none  Insurance:  Wife verified patient no longer has [employer] and now has Equities trader.   Social: H/o Patient is HOH and wife completes calls or supplements details.  Patient lives in the home with wife, [name].  Wife works on Thursday and Fridays only; patient is alone during these hours. Patient has dementia and memory issues.  Falls all the time.      Mobility: Uses a walker or cane but often forgets to use .  Falls: wife states patient falls all the time.  HOH:  Using a cheap Aid due to cost and patient misplaces too often. Pain:  None  Depression: yes but managed with medication  Transportation:  Yes  Caregiver: wife, Sunil Zepp Advance Directive: yes  Consent:  Wife consents to services.  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 H/o admissions: 2 and ED visits 0 per chart Last admission 04/18/15 - 04/21/15 Unsteady gait. Patient discharged to [facility] New:  Per oncology chart note 06/12/15:   obstructive jaundice  Outcome:  mass  determined to be a stone in CBD.  GI will be setting him for up for an ERCP in next 1-2 weeks.   Medications:  Patient taking less than 15 medications  Co-pay cost issues: no Flu Vaccine: 12/06/14 Pneumonia Vaccine:  Pneumovax 03/09/2012 Wife places medications into a container for patient to take meds from.  Puts out in container.  States patient will sometimes take extra medicine due to his memory issues.   -Patient was recently discharged from hospital and all medications have been reviewed.   Objective:   Encounter Medications:  Outpatient Encounter Prescriptions as of 06/15/2015  Medication Sig Note  . amLODipine (NORVASC) 5 MG tablet Take 1 tablet (5 mg total) by mouth daily.   Marland Kitchen aspirin 81 MG EC tablet Take 81 mg by mouth every morning.    Marland Kitchen buPROPion (WELLBUTRIN XL) 300 MG 24 hr tablet Take 300 mg by mouth every morning.   . cholecalciferol (VITAMIN D) 1000 units tablet Take 1,000 Units by mouth daily.   . clonazePAM (KLONOPIN) 2 MG tablet Take 0.5 tablets (1 mg total) by mouth 3 (three) times daily.   . divalproex (DEPAKOTE ER) 500 MG 24 hr tablet Take 1,500 mg by mouth every morning.   . gabapentin (NEURONTIN) 300 MG capsule Take by mouth See admin instructions. Take 1 in the morning, 2 in the afternoon, 1 in the evening   . Multiple Vitamin (MULTIVITAMIN) tablet Take 1 tablet by mouth daily.   . pravastatin (PRAVACHOL) 80 MG tablet Take 80 mg  by mouth daily. 06/12/2015: Received from: External Pharmacy  . guaiFENesin-dextromethorphan (ROBITUSSIN DM) 100-10 MG/5ML syrup Take 5 mLs by mouth every 4 (four) hours as needed for cough. (Patient not taking: Reported on 06/09/2015)    No facility-administered encounter medications on file as of 06/15/2015.    Functional Status:  In your present state of health, do you have any difficulty performing the following activities: 06/15/2015 04/18/2015  Hearing? Tempie Donning  Vision? N N  Difficulty concentrating or  making decisions? Tempie Donning  Walking or climbing stairs? Y Y  Dressing or bathing? Y Y  Doing errands, shopping? Y N  Preparing Food and eating ? N -  Using the Toilet? N -  In the past six months, have you accidently leaked urine? N -  Do you have problems with loss of bowel control? N -  Managing your Medications? Y -  Managing your Finances? Y -  Housekeeping or managing your Housekeeping? Y -    Fall/Depression Screening: PHQ 2/9 Scores 06/15/2015  PHQ - 2 Score 0   Fall Risk  06/15/2015  Falls in the past year? Yes  Number falls in past yr: 2 or more  Injury with Fall? No  Risk Factor Category  High Fall Risk  Risk for fall due to : History of fall(s);Impaired balance/gait;Impaired mobility;Other (Comment)  Risk for fall due to (comments): Parkinson's  Follow up Falls evaluation completed;Education provided;Falls prevention discussed   Assessment Patient and wife in need of healthcare management to address goals of care and LTC needs options to best care for patient in the home versus ALF, SNF, LTC services.  Patient needs medication system assessment to determine what options patient may have to prevent medication error or taking wrong med or too much medication.  RN CM will review PD medications and dosing schedule to insure patient's PD symptoms getting best clinical response to avoid risk of falls.   Plan:  Referral Date: 06/02/2015 Screening and Initial Assessment 06/15/2015 Program:  Other:  Fall Risk, PD, dementia, medication management  06/15/2015 Acuity 3 - 06/15/2015  Care Coordination:   -Patient scheduled for neuropsychiatric testing next week 06/2015. -RN CM will continue to monitor oncology, dementia and fall risk.  -GI will be setting him for up for an ERCP in next 1-2 weeks.   RN CM advised in next [facility] scheduled contact call within next 30 days for monthly assessment and care coordination services as needed.   RN CM advised to please notify MD of any changes in  condition prior to scheduled appt's.   RN CM provided contact name and # [phone] or main office # [phone] and 24-hour nurse line # [phone].  RN CM confirmed patient is aware of 911 services for urgent emergency needs.  RN CM sent successful outreach letter and  [facility] Introductory package. RN CM notified [place] Management Assistant: agreed to services/case opened. RN CM sent Physician Enrollment/Barriers Letter and Initial Assessment to Primary MD    Mariann Laster, RN, BSN, [facility], [place] Management Care Management Coordinator [phone] Direct [phone] Cell [phone] Office [phone] Fax

## 2015-06-16 ENCOUNTER — Ambulatory Visit: Payer: PPO

## 2015-06-19 DIAGNOSIS — H2513 Age-related nuclear cataract, bilateral: Secondary | ICD-10-CM | POA: Diagnosis not present

## 2015-06-19 DIAGNOSIS — E119 Type 2 diabetes mellitus without complications: Secondary | ICD-10-CM | POA: Diagnosis not present

## 2015-06-19 DIAGNOSIS — H04123 Dry eye syndrome of bilateral lacrimal glands: Secondary | ICD-10-CM | POA: Diagnosis not present

## 2015-06-20 ENCOUNTER — Ambulatory Visit: Payer: PPO | Admitting: Psychology

## 2015-06-20 DIAGNOSIS — F319 Bipolar disorder, unspecified: Secondary | ICD-10-CM

## 2015-06-20 DIAGNOSIS — R413 Other amnesia: Secondary | ICD-10-CM | POA: Diagnosis not present

## 2015-06-20 NOTE — Progress Notes (Signed)
NEUROPSYCHOLOGICAL INTERVIEW (CPT: [id number])  Name: Ricky Conley Date of Birth: 1945-12-14 Date of Interview: 06/20/2015  Reason for Referral:  Ricky Conley is a 70 y.o., right-handed, married male who is referred for neuropsychological evaluation by Dr. Wells Guiles Tat of [facility] due to concerns about possible dementia. This patient is accompanied in the office by his spouse who supplements the history.  History of Presenting Problem:  Ricky Conley endorsed memory problems which he feels are caused by "old age". His wife reported noticing memory decline for several years. She recalls that he got lost when driving to his doctor's office in 2011. She reported gradual onset and progressive course of memory decline. About three years ago, he stopped making bird houses because he forgot how to make them and how to follow a pattern. His wife reports that he forgets recent conversations and events, frequently misplaces and loses items, has trouble tracking the day/month/year, forgets to take medications, has word finding difficulty, sometimes uses the wrong word without realizing it, has difficulty comprehending information at times, and gets lost when driving.   Even more concerning (to his wife) than his memory problems is his mood disturbance. He reported a lifelong history of difficulty with anger management/irritability and impulsivity. He stated, "I believe I've been mean my whole life." He frequently got in to fights as a child and young man. He and his wife denied history of episodes of euphoria, delusional thinking, hallucinations or grandiosity. He and his wife reported that he was diagnosed with bipolar disorder by Dr. Casimiro Needle about three years ago. He is treated with Depakote. He has a strong family history of bipolar disorder (diagnosed in his brother, daughter and son.) His mother had a "nervous breakdown" and also demonstrated symptoms of irritability and difficulty with anger management. His  wife reported that the patient demonstrated increased anxiety when he was diagnosed with and being treated for cancer in 2001. He also experienced increased depression at that time, with suicidal ideation. He has been taking clonazepam for the anxiety since that time. Additionally, Wellbutrin was more recently started for his anger problems and depression in the morning. However, he continues to have significant irritability. He is easily provoked and hits/breaks things and causes damage to property when he is angry. He is verbally aggressive towards his wife but he has not attempted to physically harm her. He has threatened to shoot other people when he has been angry. He has gotten out of his car to yell at people due to road rage. He does still have access to his gun but he has not used it. His wife wants to hide the gun but she is worried that he will become very angry when he cannot find it. In addition to irritability, the patient's wife reported increased depression in the past two years. She reported that she has not seen him happy at all for the past two years.  Physically, the patient complains of pain from a gall stone which is scheduled to be removed later this month. He also has rectal pain secondary to surgery for colon/rectal cancer. He has a colostomy bag. The patient was having significant difficulty with balance recently. His neurologic exam with Dr. Carles Collet on 05/04/2015 was NOT consistent with Parkinson's disease. He and his wife reported that his balance has improved over the past few months. He still falls frequently (although he has not fallen in the past 1-2 weeks). He has hit his head in the context of falls but  he has not lost consciousness.   Current Functioning: Ricky Conley lives with his wife and pet dog. He is retired. He stopped driving after a recent appointment with his oncologist, at the request of his oncologist. He is unable to manage his medications, appointments or finances; his  wife takes care of these tasks. He also does not do any cooking.   Ricky Conley reported some difficulty sleeping at night, but he sleeps a lot during the day. He reported reduced appetite with a 40 lb weight loss over the past year. He does not experience hallucinations or delusions, per the patient and his wife. He denied current suicidal ideation or intention. No imminent risk of self-harm was identified.  Social History: Born/Raised: [employer] Education: 10 years. Patient reported difficulty learning in school and a lack of interest for school. He also reported behavioral problems as a child. He repeated one grade.  Occupational history: Retired. Worked as a Brewing technologist for 23 years. Marital history: Married x3. Has been with his current wife for about 30 years. Has two daughters and two sons from previous marriage. He does not see his children very often.  Alcohol/Tobacco/Substances: Mr. Ricky Conley reported remote history of heavy drinking over 30 years ago. He is a former smoker and former smokeless tobacco user. He does not consume any alcohol any more. He denied use of illicit substances.  Medical History: Past Medical History  Diagnosis Date  . Arthritis   . Anxiety   . Depression   . Hyperlipidemia   . Ascending aortic aneurysm ([place])   . Rectal cancer ([place])   . Colon cancer ([place]) 2001  . Bipolar disorder ([place])   . Diet-controlled diabetes mellitus ([place])     Current Medications:  Outpatient Encounter Prescriptions as of 06/20/2015  Medication Sig  . amLODipine (NORVASC) 5 MG tablet Take 1 tablet (5 mg total) by mouth daily.  Marland Kitchen aspirin 81 MG EC tablet Take 81 mg by mouth every morning.   Marland Kitchen buPROPion (WELLBUTRIN XL) 300 MG 24 hr tablet Take 300 mg by mouth every morning.  . cholecalciferol (VITAMIN D) 1000 units tablet Take 1,000 Units by mouth daily.  . clonazePAM (KLONOPIN) 2 MG tablet Take 0.5 tablets (1 mg total) by mouth 3 (three) times daily.  . divalproex (DEPAKOTE ER) 500  MG 24 hr tablet Take 1,500 mg by mouth every morning.  . gabapentin (NEURONTIN) 300 MG capsule Take by mouth See admin instructions. Take 1 in the morning, 2 in the afternoon, 1 in the evening  . guaiFENesin-dextromethorphan (ROBITUSSIN DM) 100-10 MG/5ML syrup Take 5 mLs by mouth every 4 (four) hours as needed for cough. (Patient not taking: Reported on 06/09/2015)  . Multiple Vitamin (MULTIVITAMIN) tablet Take 1 tablet by mouth daily.  . pravastatin (PRAVACHOL) 80 MG tablet Take 80 mg by mouth daily.   No facility-administered encounter medications on file as of 06/20/2015.   Behavioral Observations:   Appearance: Appears younger than chronological age. Casually dressed, appropriately groomed. Gait: Ambulated independently, mild unsteadiness. Speech: Fluent; Accented; normal rate, rhythm and volume Thought process: Linear Affect: Full, generally positive and almost jovial. Did not demonstrate significant irritability during the appointment but admits that he is frequently irritable with his wife at home.  Task persistence: Good Interpersonal: Pleasant, appropriate Orientation: Oriented to person, place and most aspects of time (did not know the date but accurately produced the month/day/year)  TESTING: There was medical necessity to proceed with neuropsychological assessment as the results will be used to  aid in differential diagnosis and clinical decision-making and to inform specific treatment recommendations. Per the patient, his wife and medical records reviewed, there has been a change in cognitive functioning and a reasonable suspicion of dementia.  Following the clinical interview, the patient completed 1.5 hours of neuropsychological testing.   Total face to face time spent in clinical interview: 40 minutes (CPT: [phone]) Total face to face time spent administering neuropsychological tests: 90 minutes  PLAN: The patient is scheduled for a follow-up session with this provider next week  at which time his test performances and my impressions and treatment recommendations will be reviewed in detail.   Full neuropsychological evaluation report to follow.

## 2015-06-22 ENCOUNTER — Encounter (HOSPITAL_COMMUNITY): Payer: Self-pay | Admitting: *Deleted

## 2015-06-27 ENCOUNTER — Ambulatory Visit: Payer: PPO | Admitting: Psychology

## 2015-06-27 DIAGNOSIS — F0391 Unspecified dementia with behavioral disturbance: Secondary | ICD-10-CM

## 2015-06-27 DIAGNOSIS — F319 Bipolar disorder, unspecified: Secondary | ICD-10-CM

## 2015-06-27 DIAGNOSIS — F03918 Unspecified dementia, unspecified severity, with other behavioral disturbance: Secondary | ICD-10-CM

## 2015-06-28 ENCOUNTER — Encounter: Payer: Self-pay | Admitting: Psychology

## 2015-06-28 DIAGNOSIS — F03918 Unspecified dementia, unspecified severity, with other behavioral disturbance: Secondary | ICD-10-CM | POA: Insufficient documentation

## 2015-06-28 DIAGNOSIS — F0391 Unspecified dementia with behavioral disturbance: Secondary | ICD-10-CM | POA: Insufficient documentation
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
## 2015-06-29 ENCOUNTER — Ambulatory Visit (HOSPITAL_COMMUNITY): Payer: PPO

## 2015-06-29 ENCOUNTER — Observation Stay (HOSPITAL_COMMUNITY)
Admission: RE | Admit: 2015-06-29 | Discharge: 2015-06-30 | Disposition: A | Discharge status: Home / Self Care | Payer: PPO | Source: Ambulatory Visit | Attending: Internal Medicine | Admitting: Internal Medicine

## 2015-06-29 ENCOUNTER — Ambulatory Visit (HOSPITAL_COMMUNITY): Payer: PPO | Admitting: Anesthesiology

## 2015-06-29 ENCOUNTER — Encounter (HOSPITAL_COMMUNITY): Payer: Self-pay | Admitting: Anesthesiology

## 2015-06-29 ENCOUNTER — Encounter (HOSPITAL_COMMUNITY)
Admission: RE | Disposition: A | Discharge status: Home / Self Care | Payer: Self-pay | Source: Ambulatory Visit | Attending: Internal Medicine

## 2015-06-29 DIAGNOSIS — Z87891 Personal history of nicotine dependence: Secondary | ICD-10-CM | POA: Diagnosis not present

## 2015-06-29 DIAGNOSIS — R7989 Other specified abnormal findings of blood chemistry: Secondary | ICD-10-CM | POA: Diagnosis not present

## 2015-06-29 DIAGNOSIS — H9192 Unspecified hearing loss, left ear: Secondary | ICD-10-CM | POA: Insufficient documentation

## 2015-06-29 DIAGNOSIS — E119 Type 2 diabetes mellitus without complications: Secondary | ICD-10-CM | POA: Insufficient documentation

## 2015-06-29 DIAGNOSIS — Z7982 Long term (current) use of aspirin: Secondary | ICD-10-CM | POA: Insufficient documentation

## 2015-06-29 DIAGNOSIS — Z9221 Personal history of antineoplastic chemotherapy: Secondary | ICD-10-CM | POA: Diagnosis not present

## 2015-06-29 DIAGNOSIS — B9620 Unspecified Escherichia coli [E. coli] as the cause of diseases classified elsewhere: Secondary | ICD-10-CM | POA: Insufficient documentation

## 2015-06-29 DIAGNOSIS — G20 Parkinson's disease: Secondary | ICD-10-CM | POA: Diagnosis not present

## 2015-06-29 DIAGNOSIS — K838 Other specified diseases of biliary tract: Secondary | ICD-10-CM | POA: Diagnosis not present

## 2015-06-29 DIAGNOSIS — Z79899 Other long term (current) drug therapy: Secondary | ICD-10-CM | POA: Diagnosis not present

## 2015-06-29 DIAGNOSIS — Z923 Personal history of irradiation: Secondary | ICD-10-CM | POA: Insufficient documentation

## 2015-06-29 DIAGNOSIS — I739 Peripheral vascular disease, unspecified: Secondary | ICD-10-CM | POA: Diagnosis not present

## 2015-06-29 DIAGNOSIS — K8051 Calculus of bile duct without cholangitis or cholecystitis with obstruction: Secondary | ICD-10-CM | POA: Diagnosis not present

## 2015-06-29 DIAGNOSIS — G9340 Encephalopathy, unspecified: Secondary | ICD-10-CM | POA: Diagnosis not present

## 2015-06-29 DIAGNOSIS — D649 Anemia, unspecified: Secondary | ICD-10-CM | POA: Insufficient documentation

## 2015-06-29 DIAGNOSIS — Z85038 Personal history of other malignant neoplasm of large intestine: Secondary | ICD-10-CM | POA: Diagnosis not present

## 2015-06-29 DIAGNOSIS — I712 Thoracic aortic aneurysm, without rupture: Secondary | ICD-10-CM | POA: Diagnosis not present

## 2015-06-29 DIAGNOSIS — I10 Essential (primary) hypertension: Secondary | ICD-10-CM | POA: Diagnosis not present

## 2015-06-29 DIAGNOSIS — Z933 Colostomy status: Secondary | ICD-10-CM | POA: Diagnosis not present

## 2015-06-29 DIAGNOSIS — K5710 Diverticulosis of small intestine without perforation or abscess without bleeding: Secondary | ICD-10-CM | POA: Insufficient documentation

## 2015-06-29 DIAGNOSIS — F319 Bipolar disorder, unspecified: Secondary | ICD-10-CM | POA: Diagnosis not present

## 2015-06-29 DIAGNOSIS — R945 Abnormal results of liver function studies: Secondary | ICD-10-CM | POA: Diagnosis not present

## 2015-06-29 DIAGNOSIS — K8050 Calculus of bile duct without cholangitis or cholecystitis without obstruction: Secondary | ICD-10-CM | POA: Diagnosis not present

## 2015-06-29 DIAGNOSIS — M1990 Unspecified osteoarthritis, unspecified site: Secondary | ICD-10-CM | POA: Diagnosis not present

## 2015-06-29 HISTORY — PX: ERCP: SHX5425

## 2015-06-29 HISTORY — DX: Reserved for inherently not codable concepts without codable children: IMO0001

## 2015-06-29 HISTORY — DX: Type 2 diabetes mellitus without complications: E11.9

## 2015-06-29 HISTORY — DX: Essential (primary) hypertension: I10

## 2015-06-29 HISTORY — DX: Unspecified hearing loss, unspecified ear: H91.90

## 2015-06-29 HISTORY — DX: Calculus of bile duct without cholangitis or cholecystitis without obstruction: K80.50

## 2015-06-29 HISTORY — DX: Myoneural disorder, unspecified: G70.9

## 2015-06-29 LAB — COMPREHENSIVE METABOLIC PANEL
ALT: 42 U/L (ref 17–63)
AST: 59 U/L — AB (ref 15–41)
Albumin: 2.8 g/dL — ABNORMAL LOW (ref 3.5–5.0)
Alkaline Phosphatase: 375 U/L — ABNORMAL HIGH (ref 38–126)
Anion gap: 7 (ref 5–15)
BUN: 16 mg/dL (ref 6–20)
CHLORIDE: 104 mmol/L (ref 101–111)
CO2: 29 mmol/L (ref 22–32)
Calcium: 8.5 mg/dL — ABNORMAL LOW (ref 8.9–10.3)
Creatinine, Ser: 0.89 mg/dL (ref 0.61–1.24)
Glucose, Bld: 127 mg/dL — ABNORMAL HIGH (ref 65–99)
POTASSIUM: 4.4 mmol/L (ref 3.5–5.1)
SODIUM: 140 mmol/L (ref 135–145)
Total Bilirubin: 5.9 mg/dL — ABNORMAL HIGH (ref 0.3–1.2)
Total Protein: 6.7 g/dL (ref 6.5–8.1)

## 2015-06-29 LAB — CBC WITH DIFFERENTIAL/PLATELET
BASOS ABS: 0 10*3/uL (ref 0.0–0.1)
Basophils Relative: 0 %
EOS ABS: 0 10*3/uL (ref 0.0–0.7)
EOS PCT: 0 %
HCT: 37.6 % — ABNORMAL LOW (ref 39.0–52.0)
Hemoglobin: 12 g/dL — ABNORMAL LOW (ref 13.0–17.0)
LYMPHS PCT: 7 %
Lymphs Abs: 0.4 10*3/uL — ABNORMAL LOW (ref 0.7–4.0)
MCH: 30 pg (ref 26.0–34.0)
MCHC: 31.9 g/dL (ref 30.0–36.0)
MCV: 94 fL (ref 78.0–100.0)
MONO ABS: 0.6 10*3/uL (ref 0.1–1.0)
Monocytes Relative: 10 %
Neutro Abs: 5.2 10*3/uL (ref 1.7–7.7)
Neutrophils Relative %: 83 %
PLATELETS: 155 10*3/uL (ref 150–400)
RBC: 4 MIL/uL — AB (ref 4.22–5.81)
RDW: 15.9 % — AB (ref 11.5–15.5)
WBC: 6.2 10*3/uL (ref 4.0–10.5)

## 2015-06-29 LAB — GLUCOSE, CAPILLARY: GLUCOSE-CAPILLARY: 77 mg/dL (ref 65–99)

## 2015-06-29 LAB — LIPASE, BLOOD: LIPASE: 23 U/L (ref 11–51)

## 2015-06-29 SURGERY — ERCP, WITH INTERVENTION IF INDICATED
Anesthesia: General

## 2015-06-29 MED ORDER — INDOMETHACIN 50 MG RE SUPP
RECTAL | Status: AC
  Filled 2015-06-29: qty 2

## 2015-06-29 MED ORDER — MEPERIDINE HCL 100 MG/ML IJ SOLN
6.2500 mg | INTRAMUSCULAR | Status: DC | PRN
  Administered 2015-06-29: 12.5 mg via INTRAVENOUS

## 2015-06-29 MED ORDER — SODIUM CHLORIDE 0.9 % IV SOLN
1.5000 g | INTRAVENOUS | Status: AC
  Administered 2015-06-29: 1.5 g via INTRAVENOUS
  Filled 2015-06-29: qty 1.5

## 2015-06-29 MED ORDER — SODIUM CHLORIDE 0.9 % IV SOLN
INTRAVENOUS | Status: DC | PRN
  Administered 2015-06-29: 40 mL

## 2015-06-29 MED ORDER — GABAPENTIN 300 MG PO CAPS: 300.0000 mg | ORAL_CAPSULE | Freq: Three times a day (TID) | ORAL | Status: DC

## 2015-06-29 MED ORDER — ONDANSETRON HCL 4 MG PO TABS: 4.0000 mg | ORAL_TABLET | Freq: Four times a day (QID) | ORAL | Status: DC | PRN

## 2015-06-29 MED ORDER — ACETAMINOPHEN 650 MG RE SUPP: 650.0000 mg | Freq: Four times a day (QID) | RECTAL | Status: DC | PRN

## 2015-06-29 MED ORDER — PROPOFOL 10 MG/ML IV BOLUS
INTRAVENOUS | Status: AC
  Filled 2015-06-29: qty 20

## 2015-06-29 MED ORDER — LIDOCAINE HCL (CARDIAC) 20 MG/ML IV SOLN
INTRAVENOUS | Status: DC | PRN
  Administered 2015-06-29: 80 mg via INTRATRACHEAL

## 2015-06-29 MED ORDER — VITAMIN D 1000 UNITS PO TABS
1000.0000 [IU] | ORAL_TABLET | Freq: Every day | ORAL | Status: DC
  Administered 2015-06-29 – 2015-06-30 (×2): 1000 [IU] via ORAL
  Filled 2015-06-29 (×2): qty 1

## 2015-06-29 MED ORDER — ONDANSETRON HCL 4 MG/2ML IJ SOLN: 4.0000 mg | Freq: Once | INTRAMUSCULAR | Status: DC | PRN

## 2015-06-29 MED ORDER — ALBUTEROL SULFATE (2.5 MG/3ML) 0.083% IN NEBU: 2.5000 mg | INHALATION_SOLUTION | RESPIRATORY_TRACT | Status: DC | PRN

## 2015-06-29 MED ORDER — ADULT MULTIVITAMIN W/MINERALS CH
1.0000 | ORAL_TABLET | Freq: Every day | ORAL | Status: DC
  Administered 2015-06-29 – 2015-06-30 (×2): 1 via ORAL
  Filled 2015-06-29 (×3): qty 1

## 2015-06-29 MED ORDER — GABAPENTIN 300 MG PO CAPS
300.0000 mg | ORAL_CAPSULE | Freq: Every day | ORAL | Status: DC
  Administered 2015-06-30: 300 mg via ORAL
  Filled 2015-06-29: qty 1

## 2015-06-29 MED ORDER — AMLODIPINE BESYLATE 5 MG PO TABS
5.0000 mg | ORAL_TABLET | Freq: Every day | ORAL | Status: DC
  Administered 2015-06-30: 5 mg via ORAL
  Filled 2015-06-29: qty 1

## 2015-06-29 MED ORDER — ENSURE ENLIVE PO LIQD
237.0000 mL | Freq: Two times a day (BID) | ORAL | Status: DC
  Administered 2015-06-29: 237 mL via ORAL

## 2015-06-29 MED ORDER — INDOMETHACIN 50 MG RE SUPP
100.0000 mg | Freq: Once | RECTAL | Status: AC
  Administered 2015-06-29: 100 mg via RECTAL

## 2015-06-29 MED ORDER — GLUCAGON HCL RDNA (DIAGNOSTIC) 1 MG IJ SOLR
INTRAMUSCULAR | Status: DC | PRN
Start: 2015-06-29 — End: 2015-06-29
  Administered 2015-06-29: 0.25 mg via INTRAVENOUS

## 2015-06-29 MED ORDER — PROPOFOL 10 MG/ML IV BOLUS
INTRAVENOUS | Status: DC | PRN
  Administered 2015-06-29: 180 mg via INTRAVENOUS

## 2015-06-29 MED ORDER — GABAPENTIN 300 MG PO CAPS: 600.0000 mg | ORAL_CAPSULE | ORAL | Status: DC

## 2015-06-29 MED ORDER — FENTANYL CITRATE (PF) 100 MCG/2ML IJ SOLN: 25.0000 ug | INTRAMUSCULAR | Status: DC | PRN

## 2015-06-29 MED ORDER — INDOMETHACIN 50 MG RE SUPP
50.0000 mg | Freq: Once | RECTAL | Status: AC
  Administered 2015-06-29: 50 mg via RECTAL

## 2015-06-29 MED ORDER — SODIUM CHLORIDE 0.9 % IV SOLN
INTRAVENOUS | Status: AC
  Administered 2015-06-29: 18:00:00 via INTRAVENOUS

## 2015-06-29 MED ORDER — CLONAZEPAM 1 MG PO TABS
1.0000 mg | ORAL_TABLET | Freq: Three times a day (TID) | ORAL | Status: DC
  Administered 2015-06-29 – 2015-06-30 (×3): 1 mg via ORAL
  Filled 2015-06-29 (×3): qty 1

## 2015-06-29 MED ORDER — ACETAMINOPHEN 500 MG PO TABS
1000.0000 mg | ORAL_TABLET | Freq: Four times a day (QID) | ORAL | Status: DC | PRN
  Administered 2015-06-29: 1000 mg via ORAL
  Filled 2015-06-29 (×2): qty 2

## 2015-06-29 MED ORDER — ONDANSETRON HCL 4 MG/2ML IJ SOLN
INTRAMUSCULAR | Status: DC | PRN
  Administered 2015-06-29: 4 mg via INTRAVENOUS

## 2015-06-29 MED ORDER — LACTATED RINGERS IV SOLN
INTRAVENOUS | Status: DC
  Administered 2015-06-29: 1000 mL via INTRAVENOUS

## 2015-06-29 MED ORDER — INDOMETHACIN 50 MG RE SUPP
RECTAL | Status: AC
  Filled 2015-06-29: qty 1

## 2015-06-29 MED ORDER — ONDANSETRON HCL 4 MG/2ML IJ SOLN
INTRAMUSCULAR | Status: AC
  Filled 2015-06-29: qty 2

## 2015-06-29 MED ORDER — GLUCAGON HCL RDNA (DIAGNOSTIC) 1 MG IJ SOLR
INTRAMUSCULAR | Status: AC
  Filled 2015-06-29: qty 1

## 2015-06-29 MED ORDER — SODIUM CHLORIDE 0.9 % IV SOLN: INTRAVENOUS | Status: DC

## 2015-06-29 MED ORDER — MEPERIDINE HCL 100 MG/ML IJ SOLN
INTRAMUSCULAR | Status: AC
  Filled 2015-06-29: qty 1

## 2015-06-29 MED ORDER — SUCCINYLCHOLINE CHLORIDE 20 MG/ML IJ SOLN
INTRAMUSCULAR | Status: DC | PRN
Start: 2015-06-29 — End: 2015-06-29
  Administered 2015-06-29: 100 mg via INTRAVENOUS

## 2015-06-29 MED ORDER — DIVALPROEX SODIUM ER 500 MG PO TB24
1500.0000 mg | ORAL_TABLET | Freq: Every morning | ORAL | Status: DC
  Administered 2015-06-30: 1500 mg via ORAL
  Filled 2015-06-29: qty 3

## 2015-06-29 MED ORDER — ONDANSETRON HCL 4 MG/2ML IJ SOLN: 4.0000 mg | Freq: Four times a day (QID) | INTRAMUSCULAR | Status: DC | PRN

## 2015-06-29 MED ORDER — GABAPENTIN 300 MG PO CAPS
300.0000 mg | ORAL_CAPSULE | Freq: Every day | ORAL | Status: DC
  Administered 2015-06-29: 300 mg via ORAL
  Filled 2015-06-29: qty 1

## 2015-06-29 MED ORDER — DEXTROSE-NACL 5-0.9 % IV SOLN: INTRAVENOUS | Status: DC

## 2015-06-29 MED ORDER — BUPROPION HCL ER (XL) 300 MG PO TB24
300.0000 mg | ORAL_TABLET | Freq: Every day | ORAL | Status: DC
  Administered 2015-06-30: 300 mg via ORAL
  Filled 2015-06-29: qty 1

## 2015-06-29 MED ORDER — ACETAMINOPHEN 325 MG PO TABS: 650.0000 mg | ORAL_TABLET | Freq: Four times a day (QID) | ORAL | Status: DC | PRN

## 2015-06-29 MED ORDER — ACETAMINOPHEN 10 MG/ML IV SOLN: 1000.0000 mg | Freq: Once | INTRAVENOUS | Status: DC

## 2015-06-29 NOTE — Progress Notes (Signed)
Patient ID: Ricky Conley, male   DOB: May 31, 1945, 70 y.o.   MRN: [id number]    Progress Note   Subjective   70 year old white male who underwent ERCP earlier this afternoon for common bile duct stones. He had successful removal of 2 common bile duct stones about any evidence of cholangitis at the time of the procedure. Postprocedure he had a prolonged shaking chill while coming out of anesthesia. Is not clear whether he may have had a reaction to anesthesia, versus transient bacteremia. Patient has an underlying dementia and is also on several medications for depression. He has continued to be confused several hours postprocedure. He has been hemodynamically stable, highest temperature 99.3. He has no complaints of abdominal pain. Decision made to keep him for observation due to confusion and need to rule out bacteremia/pending sepsis.    Objective   Vital signs in last 24 hours: Temp:  [97.6 F (36.4 C)-99.3 F (37.4 C)] 99.3 F (37.4 C) (05/18 1430) Pulse Rate:  [62-88] 86 (05/18 1430) Resp:  [12-18] 17 (05/18 1430) BP: (113-197)/(56-92) 181/56 mmHg (05/18 1430) SpO2:  [97 %-100 %] 100 % (05/18 1430) Weight:  [160 lb (72.576 kg)] 160 lb (72.576 kg) (05/18 1059)   General:    [name] in NAD, pleasantly confused, wife at bedside. Sclerae are iicteric Heart:  Regular rate and rhythm; no murmurs Lungs: Respirations even and unlabored, lungs CTA bilaterally Abdomen:  Soft, nontender and nondistended. Normal bowel sounds. Colostomy draining brown stool Extremities:  Without edema. Neurologic:  Alert and oriented 1  grossly normal neurologically. Psych:  Cooperative.  Intake/Output from previous day:   Intake/Output this shift: Total I/O In: 600 [I.V.:600] Out: 5 [Blood:5]  Lab Results:  Recent Labs  06/29/15 1340  WBC 6.2  HGB 12.0*  HCT 37.6*  PLT 155   BMET No results for input(s): NA, K, CL, CO2, GLUCOSE, BUN, CREATININE, CALCIUM in the last 72 hours. LFT No results  for input(s): PROT, ALBUMIN, AST, ALT, ALKPHOS, BILITOT, BILIDIR, IBILI in the last 72 hours. PT/INR No results for input(s): LABPROT, INR in the last 72 hours.  Studies/Results: Dg Ercp Biliary & Pancreatic Ducts  06/29/2015  CLINICAL DATA:  Evaluate patency of the CBD. Several biliary balloon sweeps performed with 2 stones removed. EXAM: ERCP TECHNIQUE: Multiple spot images obtained with the fluoroscopic device and submitted for interpretation post-procedure. COMPARISON:  Abdominal MRI - 06/13/2015; CT abdomen pelvis - 06/08/2015 FINDINGS: 6 seconds 11 spot intraoperative fluoroscopic images of the right upper abdominal quadrant during ERCP are provided for review. Initial image demonstrates an ERCP probe overlying the right upper abdominal quadrant. Selective images demonstrate apparent selective cannulation and opacification of a moderately dilated common bile duct. There are several persistent near occlusive filling defects within the distal aspect of the CBD compatible with findings of choledocholithiasis as demonstrated on preceding MRCP. Subsequent images demonstrate insufflation of a balloon within the central aspect of the common bile duct with subsequent presumed biliary sweeping and presumed sphincterotomy. IMPRESSION: ERCP with findings of choledocholithiasis with subsequent biliary sweeping and presumed sphincterotomy. These images were submitted for radiologic interpretation only. Please see the procedural report for the amount of contrast and the fluoroscopy time utilized. Electronically Signed   By: Ricky Conley M.D.   On: 06/29/2015 15:04       Assessment / Plan:    #37 70 year old white male with underlying dementia and Parkinson's/depression who underwent ERCP with removal of 2 common bile duct stones today. Patient with shaking  chill postprocedure and has had persistent confusion. Rule out bacteremia, rule out prolonged confusion secondary to anesthesia patient with underlying  dementia  #2 history of colon cancer remote status post colostomy # 3 bipolar disorder  Plan; Have ordered stat labs to include lipase though clinically does not appear to have pancreatitis [facility] hospitalist have agreed to accept for admission to observation Will need blood cultures and coverage with IV antibiotics GI will follow. Appreciate hospitalist care of patient.   Active Problems:   Choledocholithiasis   Dilated bile duct       Ricky Conley  06/29/2015, 4:34 PM

## 2015-06-29 NOTE — Op Note (Signed)
[facility] Patient Name: Ricky Conley Procedure Date: 06/29/2015 MRN: [id number] Attending MD: Ladene Artist , MD Date of Birth: 11/12/1945 CSN: [id number] Age: 70 Admit Type: Outpatient Procedure:                ERCP Indications:              Bile duct stone(s), Abnormal liver function test Providers:                Pricilla Riffle. Fuller Plan, MD, Carolynn Comment, RN, Vista Lawman, RN, Cletis Athens, Technician, Despina Pole,                            Technician, Dione Booze, CRNA Referring MD:             Shirline Frees, MD Medicines:                General Anesthesia Complications:            No immediate complications. Estimated Blood Loss:     Estimated blood loss: none. Procedure:                Pre-Anesthesia Assessment:                           - Prior to the procedure, a History and Physical                            was performed, and patient medications and                            allergies were reviewed. The patient's tolerance of                            previous anesthesia was also reviewed. The risks                            and benefits of the procedure and the sedation                            options and risks were discussed with the patient.                            All questions were answered, and informed consent                            was obtained. Prior Anticoagulants: The patient has                            taken no previous anticoagulant or antiplatelet                            agents. ASA Grade Assessment: II - A patient with  mild systemic disease. After reviewing the risks                            and benefits, the patient was deemed in                            satisfactory condition to undergo the procedure.                           After obtaining informed consent, the scope was                            passed under direct vision. Throughout the                             procedure, the patient's blood pressure, pulse, and                            oxygen saturations were monitored continuously. The                            [id number]) scope was introduced through                            the mouth, and used to inject contrast into and                            used to inject contrast into the bile duct. The                            ERCP was accomplished without difficulty. The                            patient tolerated the procedure well. Scope In: Scope Out: Findings:      The scout film was normal. The esophagus was successfully intubated       under direct vision. The scope was advanced to a normal major papilla in       the descending duodenum without detailed examination of the pharynx,       larynx and associated structures, and upper GI tract. The upper GI tract       was grossly normal. A small periampullary diveritculum was noted. A       straight Roadrunner wire was passed into the biliary tree. The       short-nosed traction sphincterotome was passed over the guidewire and       the bile duct was then deeply cannulated. Contrast was injected. I       personally interpreted the bile duct images. There was appropriate flow       of contrast through the ducts. The lower third of the main bile duct       contained two stones, the largest of which was 10 mm in diameter. The       common bile duct was diffusely mildly dilated. The intrahepatic ducts       appeared normal. A biliary sphincterotomy was made with a traction       (  standard) sphincterotome. There was no post-sphincterotomy bleeding. To       discover objects, the biliary tree was swept several times with a 12 mm       balloon starting at the bifurcation. Sludge was swept from the duct. Two       stones were removed from the duct. No stones remained. Excellent biliary       drainage was noted following stone extraction. The PD was cannulated of        filled. Impression:               - The common bile duct was dilated.                           - Choledocholithiasis was found in the distal CBD.                            Complete removal was accomplished by biliary                            sphincterotomy and balloon extraction.                           - A biliary sphincterotomy was performed.                           - The biliary tree was swept and sludge was found.                           - Small duodenal diverticulum. Moderate Sedation:      N/A- Per Anesthesia Care Recommendation:           - Avoid aspirin and nonsteroidal anti-inflammatory                            medicines for 2 weeks.                           - Clear liquid diet for 2 hours - advance as                            tolerated to resume previous diet.                           - Return to my office in 4-6 weeks.                           - Check liver enzymes (AST, ALT, alkaline                            phosphatase, bilirubin) in 4-6 weeks a few days                            prior to office visit. Procedure Code(s):        --- Professional ---  [phone], Endoscopic retrograde                            cholangiopancreatography (ERCP); with removal of                            calculi/debris from biliary/pancreatic duct(s)                           43262, Endoscopic retrograde                            cholangiopancreatography (ERCP); with                            sphincterotomy/papillotomy                           [id number], Endoscopic catheterization of the biliary                            ductal system, radiological supervision and                            interpretation Diagnosis Code(s):        --- Professional ---                           K80.50, Calculus of bile duct without cholangitis                            or cholecystitis without obstruction                           R94.5, Abnormal results of liver  function studies                           K83.8, Other specified diseases of biliary tract                           K57.10, Diverticulosis of small intestine without                            perforation or abscess without bleeding CPT copyright 2016 American Medical Association. All rights reserved. The codes documented in this report are preliminary and upon coder review may  be revised to meet current compliance requirements. Ladene Artist, MD 06/29/2015 1:48:52 PM This report has been signed electronically. Number of Addenda: 0

## 2015-06-29 NOTE — Anesthesia Procedure Notes (Signed)
Procedure Name: Intubation Date/Time: 06/29/2015 12:49 PM Performed by: Dione Booze Pre-anesthesia Checklist: Emergency Drugs available, Suction available, Patient being monitored and Patient identified Patient Re-evaluated:Patient Re-evaluated prior to inductionOxygen Delivery Method: Circle system utilized Preoxygenation: Pre-oxygenation with 100% oxygen Intubation Type: IV induction Laryngoscope Size: Mac and 4 Grade View: Grade I Tube type: Oral Tube size: 7.5 mm Number of attempts: 1 Airway Equipment and Method: Stylet Placement Confirmation: ETT inserted through vocal cords under direct vision and breath sounds checked- equal and bilateral Secured at: 21 cm Tube secured with: Tape Dental Injury: Teeth and Oropharynx as per pre-operative assessment

## 2015-06-29 NOTE — Anesthesia Preprocedure Evaluation (Addendum)
Anesthesia Evaluation  Patient identified by MRN, date of birth, ID band Patient awake    Reviewed: Allergy & Precautions, NPO status , Patient's Chart, lab work & pertinent test results  Airway Mallampati: II  TM Distance: >3 FB Neck ROM: Limited    Dental  (+) Upper Dentures   Pulmonary neg pulmonary ROS, former smoker,    Pulmonary exam normal breath sounds clear to auscultation       Cardiovascular hypertension, Pt. on medications + Peripheral Vascular Disease  Normal cardiovascular exam Rhythm:Regular Rate:Normal  EKG NSR LVH   Neuro/Psych PSYCHIATRIC DISORDERS Anxiety Depression Bipolar Disorder Parkinson's Disease   Neuromuscular disease    GI/Hepatic Elevated LFT's Common bile duct calculus Hx/o Rectal Ca   Endo/Other  diabetes, Well Controlled  Renal/GU   negative genitourinary   Musculoskeletal  (+) Arthritis , Osteoarthritis,    Abdominal   Peds  Hematology  (+) anemia ,   Anesthesia Other Findings   Reproductive/Obstetrics                           Anesthesia Physical Anesthesia Plan  ASA: III  Anesthesia Plan: General   Post-op Pain Management:    Induction: Intravenous  Airway Management Planned: Oral ETT  Additional Equipment:   Intra-op Plan:   Post-operative Plan: Extubation in OR  Informed Consent: I have reviewed the patients History and Physical, chart, labs and discussed the procedure including the risks, benefits and alternatives for the proposed anesthesia with the patient or authorized representative who has indicated his/her understanding and acceptance.   Dental advisory given  Plan Discussed with: Anesthesiologist, CRNA and Surgeon  Anesthesia Plan Comments:         Anesthesia Quick Evaluation

## 2015-06-29 NOTE — H&P (Signed)
HISTORY AND PHYSICAL       PATIENT DETAILS Name: Ricky Conley Age: 70 y.o. Sex: male Date of Birth: [birth year] Admit Date: 06/29/2015 [id number], Gwyndolyn Saxon, MD Patient coming from: Home   CHIEF COMPLAINT:  Encephalopathy and chills post ERCP.  HPI: Ricky Conley is a 70 y.o. male with medical history significant of colon cancer requiring colostomy and status post chemoradiation, bipolar disorder who was brought to the endoscopy suite by gastroenterology for ERCP due to obstructive jaundice.. Patient was diagnosed to have choledocholithiasis as an outpatient, after his LFTs were found to be abnormal. He subsequently underwent ERCP, following which, patient was noted to have chills and worsening confusion. He was observed in the endoscopy suite for a few hours, with only minimal improvement, as a result the hospitalist service was asked to admit him for further evaluation and treatment.  During my evaluation, patient was given Tylenol, indomethacin, and was already feeling much better. He was able to answer most of my questions appropriately, his chills were rapidly improving as well.  Lives at: Home Mobility: Independent Chronic Indwelling Foley:no   REVIEW OF SYSTEMS:  Constitutional:   No  weight loss, night sweats,  Fevers, fatigue.  HEENT:    No headaches, Dysphagia,Tooth/dental problems,Sore throat,  No sneezing, itching, ear ache, nasal congestion, post nasal drip  Cardio-vascular: No chest pain,Orthopnea, PND,lower extremity edema, anasarca, palpitations  GI:  No heartburn, indigestion, abdominal pain, nausea, vomiting, diarrhea, melena or hematochezia  Resp: No shortness of breath, cough, hemoptysis,plueritic chest pain.   Skin:  No rash or lesions.  GU:  No dysuria, change in color of urine, no urgency or frequency.  No flank pain.  Musculoskeletal: No joint pain or swelling.  No decreased range of motion.  No back pain.  Endocrine: No heat  intolerance, no cold intolerance, no polyuria, no polydipsia  Psych: No change in mood or affect. No depression or anxiety.  No memory loss.   ALLERGIES:  No Known Allergies  PAST MEDICAL HISTORY: Past Medical History  Diagnosis Date  . Arthritis   . Anxiety   . Depression   . Hyperlipidemia   . Diet-controlled diabetes mellitus ([place])   . Hypertension   . Ascending aortic aneurysm (H[facility])     Dr. Cyndia Bent follows- last check 2 yrs.  . Diabetes mellitus without complication ([place])     Type II " controlled with diet"  . Gallstones, common bile duct   . Rectal cancer ([place])   . Colon cancer [facility]) 2001    Chemotherapy, radiation- no longer seeing oncology  . Impaired hearing     wears hearing aid left ear.  . Bipolar disorder ([place])     tx Depakote  . Neuromuscular disorder ([place])     rectal nerve damage' oversewed" chronic pain tx. Gabapentin.    PAST SURGICAL HISTORY: Past Surgical History  Procedure Laterality Date  . Colonoscopy    . Back surgery  2003  . Colectomy  2001  . Colostomy  2001  . Carpal tunnel release Bilateral 2008  . Fracture surgery Left 2009    ankle with hardware    MEDICATIONS AT HOME: Prior to Admission medications   Medication Sig Start Date End Date Taking? Authorizing Provider  amLODipine (NORVASC) 5 MG tablet Take 1 tablet (5 mg total) by mouth daily. 04/12/15  Yes Velvet Bathe, MD  aspirin 81 MG EC tablet Take 81 mg by mouth every morning.    Yes Historical Provider,  MD  buPROPion (WELLBUTRIN XL) 300 MG 24 hr tablet Take 300 mg by mouth every morning.   Yes Historical Provider, MD  cholecalciferol (VITAMIN D) 1000 units tablet Take 1,000 Units by mouth daily.   Yes Historical Provider, MD  clonazePAM (KLONOPIN) 2 MG tablet Take 0.5 tablets (1 mg total) by mouth 3 (three) times daily. 04/20/15  Yes Nishant Dhungel, MD  divalproex (DEPAKOTE ER) 500 MG 24 hr tablet Take 1,500 mg by mouth every morning.   Yes Historical Provider, MD  gabapentin  (NEURONTIN) 300 MG capsule Take 300-600 mg by mouth 3 (three) times daily. Take one capsule in the morning, two capsules in the afternoon and one capsule in the evening.   Yes Historical Provider, MD  Hydrocortisone (0000000 EX) Apply 1 application topically daily as needed (Applies to stoma for redness.).   Yes Historical Provider, MD  Multiple Vitamin (MULTIVITAMIN) tablet Take 1 tablet by mouth daily.   Yes Historical Provider, MD  pravastatin (PRAVACHOL) 80 MG tablet Take 80 mg by mouth at bedtime.  05/22/15  Yes Historical Provider, MD    FAMILY HISTORY: Family History  Problem Relation Age of Onset  . Cancer Maternal Uncle   . Cancer Brother   . Colon cancer Neg Hx   . Stroke Father     SOCIAL HISTORY:  reports that he quit smoking about 45 years ago. He quit smokeless tobacco use about 17 years ago. He reports that he does not drink alcohol or use illicit drugs.  PHYSICAL EXAM: Blood pressure 141/70, pulse 99, temperature 99.7 F (37.6 C), temperature source Oral, resp. rate 18, height 5\' 10"  (1.778 m), weight 73.6 kg (162 lb 4.1 oz), SpO2 94 %.  General appearance :Awake, alert, not in any distress. Speech Clear. Not toxic Looking-but looks chronically sick HEENT: Atraumatic and Normocephalic, pupils equally reactive to light and accomodation Neck: supple, no JVD. No cervical lymphadenopathy.  Chest:Good air entry bilaterally, no added sounds  CVS: S1 S2 regular, no murmurs.  Abdomen: Bowel sounds present, Non tender and not distended with no gaurding, rigidity or rebound. Colostomy present Extremities: B/L Lower Ext shows no edema, both legs are warm to touch Neurology:  Non focal Psychiatric: Normal judgment and insight. Alert and oriented x 3. Normal mood. Skin:No Rash Wounds:N/A  LABS ON ADMISSION:  I have personally reviewed following labs and imaging studies  CBC:  Recent Labs Lab 06/29/15 1340  WBC 6.2  NEUTROABS 5.2  HGB 12.0*  HCT 37.6*  MCV 94.0    PLT [id number]    Basic Metabolic Panel:  Recent Labs Lab 06/29/15 1340  NA 140  K 4.4  CL 104  CO2 29  GLUCOSE 127*  BUN 16  CREATININE 0.89  CALCIUM 8.5*    GFR: Estimated Creatinine Clearance: 78.6 mL/min (by C-G formula based on Cr of 0.89).  Liver Function Tests:  Recent Labs Lab 06/29/15 1340  AST 59*  ALT 42  ALKPHOS 375*  BILITOT 5.9*  PROT 6.7  ALBUMIN 2.8*    Recent Labs Lab 06/29/15 1340  LIPASE 23   No results for input(s): AMMONIA in the last 168 hours.  Coagulation Profile: No results for input(s): INR, PROTIME in the last 168 hours.  Cardiac Enzymes: No results for input(s): CKTOTAL, CKMB, CKMBINDEX, TROPONINI in the last 168 hours.  BNP (last 3 results) No results for input(s): PROBNP in the last 8760 hours.  HbA1C: No results for input(s): HGBA1C in the last 72 hours.  CBG:  Recent Labs  Lab 06/29/15 1116  GLUCAP 77    Lipid Profile: No results for input(s): CHOL, HDL, LDLCALC, TRIG, CHOLHDL, LDLDIRECT in the last 72 hours.  Thyroid Function Tests: No results for input(s): TSH, T4TOTAL, FREET4, T3FREE, THYROIDAB in the last 72 hours.  Anemia Panel: No results for input(s): VITAMINB12, FOLATE, FERRITIN, TIBC, IRON, RETICCTPCT in the last 72 hours.  Urine analysis:    Component Value Date/Time   COLORURINE AMBER* 04/18/2015 1425   APPEARANCEUR CLEAR 04/18/2015 1425   LABSPEC 1.014 04/18/2015 1425   PHURINE 6.0 04/18/2015 1425   GLUCOSEU NEGATIVE 04/18/2015 1425   HGBUR SMALL* 04/18/2015 1425   BILIRUBINUR NEGATIVE 04/18/2015 1425   KETONESUR NEGATIVE 04/18/2015 1425   PROTEINUR NEGATIVE 04/18/2015 1425   UROBILINOGEN 2.0* 02/18/2013 1611   NITRITE NEGATIVE 04/18/2015 1425   LEUKOCYTESUR NEGATIVE 04/18/2015 1425    Sepsis Labs: Lactic Acid, Venous    Component Value Date/Time   LATICACIDVEN 1.12 04/18/2015 1553     Microbiology: No results found for this or any previous visit (from the past 240 hour(s)).     RADIOLOGIC STUDIES ON ADMISSION: Dg Ercp Biliary & Pancreatic Ducts  06/29/2015  CLINICAL DATA:  Evaluate patency of the CBD. Several biliary balloon sweeps performed with 2 stones removed. EXAM: ERCP TECHNIQUE: Multiple spot images obtained with the fluoroscopic device and submitted for interpretation post-procedure. COMPARISON:  Abdominal MRI - 06/13/2015; CT abdomen pelvis - 06/08/2015 FINDINGS: 6 seconds 11 spot intraoperative fluoroscopic images of the right upper abdominal quadrant during ERCP are provided for review. Initial image demonstrates an ERCP probe overlying the right upper abdominal quadrant. Selective images demonstrate apparent selective cannulation and opacification of a moderately dilated common bile duct. There are several persistent near occlusive filling defects within the distal aspect of the CBD compatible with findings of choledocholithiasis as demonstrated on preceding MRCP. Subsequent images demonstrate insufflation of a balloon within the central aspect of the common bile duct with subsequent presumed biliary sweeping and presumed sphincterotomy. IMPRESSION: ERCP with findings of choledocholithiasis with subsequent biliary sweeping and presumed sphincterotomy. These images were submitted for radiologic interpretation only. Please see the procedural report for the amount of contrast and the fluoroscopy time utilized. Electronically Signed   By: Sandi Mariscal M.D.   On: 06/29/2015 15:04    EKG:  Not done  ASSESSMENT AND PLAN: Present on Admission:  . Acute encephalopathy with chills: Suspect this is related to sedation/anesthesia provided during ERCP, although he could have bacteremia-he does not appear to have fever and look toxic during my exam. For now, would admit him for further observation and obtain blood cultures. We will hold off on starting antibiotics. He is already improving, he is mostly awake and alert and seems to be close to his baseline. If  posthospitalization-he is found to have fever or if chills reoccur, he will need likely to be started on broad-spectrum antibiotics with Zosyn or Unasyn. Suspect that if he continues to improve, he could be discharged home on 5/19.  Marland Kitchen Choledocholithiasis with obstructive jaundice: Status post ERCP.See above regarding antibiotics. GI will follow tomorrow.  . History of bipolar disorder: Continue Wellbutrin and Depakote. P  Further plan will depend as patient's clinical course evolves and further radiologic and laboratory data become available. Patient will be monitored closely.  Above noted plan was discussed with patient/spouse face to face at bedside, they were in agreement.   CONSULTS: GI  DVT Prophylaxis: SCD's  Code Status: Full Code  Disposition Plan:  Discharge back home in  1 day  Admission status: Obs going to medical floor   Total time spent  45 minutes.Greater than 50% of this time was spent in counseling, explanation of diagnosis, planning of further management, and coordination of care.  [facility] Pager [phone]  If 7PM-7AM, please contact night-coverage [email or web address] Password [facility] 06/29/2015, 5:40 PM

## 2015-06-29 NOTE — H&P (Signed)
History of Present Illness: This is a 70 year old male who has history of colon cancer in 2001 requiring surgery with colostomy, chemotherapy, radiation. He follows regularly with Dr. Fuller Plan for his colonoscopies and last was in 2015. He presents to our office today at the request of his PCP for evaluation regarding recent elevated LFTs and now an abnormal CT scan. The patient apparently was found have some abnormal LFTs back in March. Ultrasound of the abdomen showed mild intra-and extrahepatic biliary dilatation of undetermined etiology, but the common bile duct caliber seemed similar to a chest CT 2 years prior. Due to elevation of LFTs it was recommended he have a CT scan performed, however. This was just done yesterday of the abdomen and pelvis with contrast. This revealed biliary obstruction due to 1.6 cm high density structure in the vicinity of the ampulla possibly large gallstone but ampullary tumors not totally excluded. There is also something high-density material centrally in the common bile duct possibly blood products or other gallstones. He also has some enlarged lymph nodes in the peripancreatic/porta hepatis region. The patient denies any abdominal pain, fever, chills, etc. He does have some memory issues, but his wife is present and corroborates his story. The last labs that we have from his PCP are from April 11, which revealed an alkaline phosphatase of 368, ALT 47, AST 48, total bilirubin 1.3 with a direct bilirubin of 0.7. CEA level was drawn and was 1.2.  IMPRESSION MRCP 06/13/15:  1. Obstructing choledocholithiasis in the lower third of the common bile duct as described. Common bile duct diameter 12 mm. Mild-to-moderate diffuse intrahepatic biliary ductal dilatation. No evidence of a mass obstructing the biliary tree.  2. Mild porta hepatis lymphadenopathy, nonspecific. Recommend attention on follow-up CT abdomen/pelvis with IV and oral contrast in 3 months.   Current Medications,  Allergies, Past Medical History, Past Surgical History, Family History and Social History were reviewed in [employer] record.   Physical Exam: BP 110/64 mmHg  Pulse 80  Ht 5\' 10"  (1.778 m)  Wt 157 lb 6 oz (71.385 kg)  BMI 22.58 kg/m2 General: Well developed white male in no acute distress Head: Normocephalic and atraumatic Eyes: Sclerae mildly icteric. Ears: HOH. Lungs: Clear throughout to auscultation Heart: Regular rate and rhythm Abdomen: Soft, non-distended. Normal bowel sounds. Non-tender. Colostomy in place. Musculoskeletal: Symmetrical with no gross deformities  Extremities: No edema  Neurological: Alert oriented x 4, grossly non-focal Psychological: Alert and cooperative. Normal mood and affect  Assessment and Recommendations: -70 year old male with elevated LFTs and CT scan showing biliary obstruction with a 1.6 cm structure in the vicinity of the ampulla question stone versus ampullary tumor. Subsequent MRCP showing a distal CBD stone and biliary dilation.Check labs including CBC, CMP, PT/INR, lipase, CEA and a CA-19-9. He is scheduled to see Dr. Benay Spice on Monday. Schedule ERCP. Currently he is asymptomatic with no complaints of abdominal pain, fever, etc. -Personal history of colon cancer in 2001 with surgery, chemotherapy, radiation. Has colostomy in place. Colonoscopy up-to-date with Dr. Fuller Plan.

## 2015-06-29 NOTE — Anesthesia Postprocedure Evaluation (Signed)
Anesthesia Post Note  Patient: Ricky Conley  Procedure(s) Performed: Procedure(s) (LRB): ENDOSCOPIC RETROGRADE CHOLANGIOPANCREATOGRAPHY (ERCP) (N/A)  Patient location during evaluation: PACU Anesthesia Type: General Level of consciousness: awake and alert and oriented Pain management: pain level controlled Vital Signs Assessment: post-procedure vital signs reviewed and stable Respiratory status: spontaneous breathing, nonlabored ventilation and respiratory function stable Cardiovascular status: blood pressure returned to baseline and stable Postop Assessment: no signs of nausea or vomiting Anesthetic complications: no    Last Vitals:  Filed Vitals:   06/29/15 1350 06/29/15 1400  BP: 113/86 143/92  Pulse: 84 77  Temp:    Resp: 12 14    Last Pain: There were no vitals filed for this visit.               Zane Samson A.

## 2015-06-29 NOTE — OR Nursing (Signed)
While in recovery, pt. Presented with chills and confusion.  Dr. Royce Macadamia notified. Orders given.  Pt. Continued to present with confusion, weakness and chills.  Pt. Temp 99.3.  Dr. Fuller Plan notified, Dr. Royce Macadamia notified and Dr. Loletha Carrow notified.  Orders given.  Pt. Given tylenol.  Temp 98.8.  Will continue to monitor pt. Family at bedside.    Laverta Baltimore, RN

## 2015-06-29 NOTE — Transfer of Care (Signed)
Immediate Anesthesia Transfer of Care Note  Patient: Ricky Conley  Procedure(s) Performed: Procedure(s): ENDOSCOPIC RETROGRADE CHOLANGIOPANCREATOGRAPHY (ERCP) (N/A)  Patient Location: PACU and Endoscopy Unit  Anesthesia Type:General  Level of Consciousness: awake and patient cooperative  Airway & Oxygen Therapy: Patient Spontanous Breathing and Patient connected to face mask oxygen  Post-op Assessment: Report given to RN and Post -op Vital signs reviewed and stable  Post vital signs: Reviewed and stable  Last Vitals:  Filed Vitals:   06/29/15 1059  BP: 164/75  Pulse: 62  Temp: 36.4 C  Resp: 12    Last Pain: There were no vitals filed for this visit.       Complications: No apparent anesthesia complications

## 2015-06-29 NOTE — Discharge Instructions (Signed)
Endoscopic Retrograde Cholangiopancreatography (ERCP), Care After °Refer to this sheet in the next few weeks. These instructions provide you with information on caring for yourself after your procedure. Your health care provider may also give you more specific instructions. Your treatment has been planned according to current medical practices, but problems sometimes occur. Call your health care provider if you have any problems or questions after your procedure.  °WHAT TO EXPECT AFTER THE PROCEDURE  °After your procedure, it is typical to feel:  °· Soreness in your throat.   °· Sick to your stomach (nauseous).   °· Bloated. °· Dizzy.   °· Fatigued. °HOME CARE INSTRUCTIONS °· Have a friend or family member stay with you for the first 24 hours after your procedure. °· Start taking your usual medicines and eating normally as soon as you feel well enough to do so or as directed by your health care provider. °SEEK MEDICAL CARE IF: °· You have abdominal pain.   °· You develop signs of infection, such as:   °¨ Chills.   °¨ Feeling unwell.   °SEEK IMMEDIATE MEDICAL CARE IF: °· You have difficulty swallowing. °· You have worsening throat, chest, or abdominal pain. °· You vomit. °· You have bloody or very black stools. °· You have a fever. °  °This information is not intended to replace advice given to you by your health care provider. Make sure you discuss any questions you have with your health care provider. °  °Document Released: 11/18/2012 Document Reviewed: 11/18/2012 °Elsevier Interactive Patient Education ©2016 [employer] ° °

## 2015-06-30 ENCOUNTER — Encounter (HOSPITAL_COMMUNITY): Payer: Self-pay | Admitting: Gastroenterology

## 2015-06-30 DIAGNOSIS — G9340 Encephalopathy, unspecified: Secondary | ICD-10-CM | POA: Diagnosis not present

## 2015-06-30 DIAGNOSIS — K8051 Calculus of bile duct without cholangitis or cholecystitis with obstruction: Secondary | ICD-10-CM | POA: Diagnosis not present

## 2015-06-30 DIAGNOSIS — K8050 Calculus of bile duct without cholangitis or cholecystitis without obstruction: Secondary | ICD-10-CM | POA: Diagnosis not present

## 2015-06-30 LAB — BLOOD CULTURE ID PANEL (REFLEXED)
ACINETOBACTER BAUMANNII: NOT DETECTED
CARBAPENEM RESISTANCE: NOT DETECTED
Candida albicans: NOT DETECTED
Candida glabrata: NOT DETECTED
Candida krusei: NOT DETECTED
Candida parapsilosis: NOT DETECTED
Candida tropicalis: NOT DETECTED
ENTEROBACTERIACEAE SPECIES: NOT DETECTED
Enterobacter cloacae complex: NOT DETECTED
Enterococcus species: NOT DETECTED
Escherichia coli: DETECTED — AB
HAEMOPHILUS INFLUENZAE: NOT DETECTED
Klebsiella oxytoca: NOT DETECTED
Klebsiella pneumoniae: NOT DETECTED
LISTERIA MONOCYTOGENES: NOT DETECTED
METHICILLIN RESISTANCE: NOT DETECTED
NEISSERIA MENINGITIDIS: NOT DETECTED
PSEUDOMONAS AERUGINOSA: NOT DETECTED
Proteus species: NOT DETECTED
SERRATIA MARCESCENS: NOT DETECTED
STAPHYLOCOCCUS AUREUS BCID: NOT DETECTED
STAPHYLOCOCCUS SPECIES: NOT DETECTED
STREPTOCOCCUS PYOGENES: NOT DETECTED
STREPTOCOCCUS SPECIES: NOT DETECTED
Streptococcus agalactiae: NOT DETECTED
Streptococcus pneumoniae: NOT DETECTED
Vancomycin resistance: NOT DETECTED

## 2015-06-30 LAB — COMPREHENSIVE METABOLIC PANEL
ALT: 47 U/L (ref 17–63)
AST: 89 U/L — AB (ref 15–41)
Albumin: 2.3 g/dL — ABNORMAL LOW (ref 3.5–5.0)
Alkaline Phosphatase: 286 U/L — ABNORMAL HIGH (ref 38–126)
Anion gap: 4 — ABNORMAL LOW (ref 5–15)
BILIRUBIN TOTAL: 4.6 mg/dL — AB (ref 0.3–1.2)
BUN: 12 mg/dL (ref 6–20)
CO2: 32 mmol/L (ref 22–32)
CREATININE: 0.74 mg/dL (ref 0.61–1.24)
Calcium: 8.3 mg/dL — ABNORMAL LOW (ref 8.9–10.3)
Chloride: 105 mmol/L (ref 101–111)
Glucose, Bld: 103 mg/dL — ABNORMAL HIGH (ref 65–99)
POTASSIUM: 4.4 mmol/L (ref 3.5–5.1)
Sodium: 141 mmol/L (ref 135–145)
TOTAL PROTEIN: 5.7 g/dL — AB (ref 6.5–8.1)

## 2015-06-30 LAB — CBC
HEMATOCRIT: 30.2 % — AB (ref 39.0–52.0)
Hemoglobin: 9.8 g/dL — ABNORMAL LOW (ref 13.0–17.0)
MCH: 29.6 pg (ref 26.0–34.0)
MCHC: 32.5 g/dL (ref 30.0–36.0)
MCV: 91.2 fL (ref 78.0–100.0)
PLATELETS: 112 10*3/uL — AB (ref 150–400)
RBC: 3.31 MIL/uL — AB (ref 4.22–5.81)
RDW: 15.9 % — AB (ref 11.5–15.5)
WBC: 5.8 10*3/uL (ref 4.0–10.5)

## 2015-06-30 MED ORDER — CIPROFLOXACIN HCL 500 MG PO TABS: 500.0000 mg | ORAL_TABLET | Freq: Two times a day (BID) | ORAL | Status: DC

## 2015-06-30 NOTE — Progress Notes (Signed)
Informed by lab-patients prelim blood cultures are positive for E Coli, spoke with patients spouse-over the phone-patient is doing well, he has already filled script for 5 days of Cipro, he will continue with this, till I get sensitivity results, following which I will call in a supply to his [facility] for him to complete a 2 week course of Abx. Spouse was instructed to watch for persistent fever, change in mental status or any worsening, and was asked to bring the patient back to the ED immediately if these occurred.Spouse was agreeable with this plan.

## 2015-06-30 NOTE — Care Management Note (Signed)
Case Management Note  Patient Details  Name: Ricky Conley MRN: [id number] Date of Birth: 08-09-1945  Subjective/Objective:                    Action/Plan:d/c home no needs or orders.   Expected Discharge Date:                  Expected Discharge Plan:  Home/Self Care  In-House Referral:     Discharge planning Services  CM Consult  Post Acute Care Choice:    Choice offered to:     DME Arranged:    DME Agency:     HH Arranged:    Orient Agency:     Status of Service:  Completed, signed off  Medicare Important Message Given:    Date Medicare IM Given:    Medicare IM give by:    Date Additional Medicare IM Given:    Additional Medicare Important Message give by:     If discussed at [place] of Stay Meetings, dates discussed:    Additional Comments:  Dessa Phi, RN 06/30/2015, 12:56 PM

## 2015-06-30 NOTE — Progress Notes (Signed)
Tolerating diet Afebrile Ambulating the hallway Wants to go home D/c home-spouse aware that blood cultures will need to be followed by PCP

## 2015-06-30 NOTE — Discharge Summary (Signed)
PATIENT DETAILS Name: Ricky Conley Age: 70 y.o. Sex: male Date of Birth: 19-Jun-1945 MRN: [id number]. Admitting Physician: Jonetta Osgood, MD [id number], Gwyndolyn Saxon, MD  Admit Date: 06/29/2015 Discharge date: 06/30/2015  Recommendations for Outpatient Follow-up:  1. Follow blood cultures till final 2. Resume Statins when LFT's better-follow LFT's till they have normalized   PRIMARY DISCHARGE DIAGNOSIS:  Active Problems:   Parkinson's disease (tremor, stiffness, slow motion, unstable posture) (HCC)   Choledocholithiasis   Dilated bile duct   Acute encephalopathy   Encephalopathy      PAST MEDICAL HISTORY: Past Medical History  Diagnosis Date  . Arthritis   . Anxiety   . Depression   . Hyperlipidemia   . Diet-controlled diabetes mellitus ([place])   . Hypertension   . Ascending aortic aneurysm (HCC)     Dr. Cyndia Bent follows- last check 2 yrs.  . Diabetes mellitus without complication ([place])     Type II " controlled with diet"  . Gallstones, common bile duct   . Rectal cancer ([place])   . Colon cancer [facility]) 2001    Chemotherapy, radiation- no longer seeing oncology  . Impaired hearing     wears hearing aid left ear.  . Bipolar disorder ([place])     tx Depakote  . 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 Gabapentin.    DISCHARGE MEDICATIONS: Current Discharge Medication List    START taking these medications   Details  ciprofloxacin (CIPRO) 500 MG tablet Take 1 tablet (500 mg total) by mouth 2 (two) times daily. Qty: 10 tablet, Refills: 0      CONTINUE these medications which have NOT CHANGED   Details  amLODipine (NORVASC) 5 MG tablet Take 1 tablet (5 mg total) by mouth daily. Qty: 30 tablet, Refills: 0    aspirin 81 MG EC tablet Take 81 mg by mouth every morning.     buPROPion (WELLBUTRIN XL) 300 MG 24 hr tablet Take 300 mg by mouth every morning.    cholecalciferol (VITAMIN D) 1000 units tablet Take 1,000 Units by  mouth daily.    clonazePAM (KLONOPIN) 2 MG tablet Take 0.5 tablets (1 mg total) by mouth 3 (three) times daily. Qty: 30 tablet, Refills: 0    divalproex (DEPAKOTE ER) 500 MG 24 hr tablet Take 1,500 mg by mouth every morning.    gabapentin (NEURONTIN) 300 MG capsule Take 300-600 mg by mouth 3 (three) times daily. Take one capsule in the morning, two capsules in the afternoon and one capsule in the evening.    Hydrocortisone (0000000 EX) Apply 1 application topically daily as needed (Applies to stoma for redness.).    Multiple Vitamin (MULTIVITAMIN) tablet Take 1 tablet by mouth daily.      STOP taking these medications     pravastatin (PRAVACHOL) 80 MG tablet         ALLERGIES:  No Known Allergies  BRIEF HPI:  See H&P, Labs, Consult and Test reports for all details in brief, patient a 70 y.o. male with medical history significant of colon cancer requiring colostomy and status post chemoradiation, bipolar disorder who was brought to the endoscopy suite by gastroenterology for ERCP due to obstructive jaundice-post ERCP he was noted to have chills and confusion. He was then admitted for further evaluation and treatment  CONSULTATIONS:   GI  PERTINENT RADIOLOGIC STUDIES: Ct Chest W Contrast  06/08/2015  CLINICAL DATA:  Colon cancer in 2001 with colostomy, chemotherapy, and  radiation therapy. Weight loss 40 pounds over the past year. EXAM: CT CHEST, ABDOMEN, AND PELVIS WITH CONTRAST TECHNIQUE: Multidetector CT imaging of the chest, abdomen and pelvis was performed following the standard protocol during bolus administration of intravenous contrast. CONTRAST:  156mL ISOVUE-300 IOPAMIDOL (ISOVUE-300) INJECTION 61% Creatinine was obtained on site at [place] at [address] Results: Creatinine 0.8 mg/dL. COMPARISON:  Multiple exams, including 03/08/2013 and 04/19/2015, among others FINDINGS: Despite efforts by the technologist and patient, motion artifact is present on  today's exam and could not be eliminated. This reduces exam sensitivity and specificity. CT CHEST FINDINGS Mediastinum/Nodes: Coronary, aortic arch, and branch vessel atherosclerotic vascular disease. Ascending aortic caliber stable of 4.1 cm. There is a right internal mammary lymph node measuring 0.6 cm in short axis, image 22/3, previously 0.3 cm in short axis on 03/08/2013. Lungs/Pleura: Unremarkable Musculoskeletal: Bilateral degenerative glenohumeral and sternoclavicular arthropathy. Thoracic spondylosis. CT ABDOMEN PELVIS FINDINGS Hepatobiliary: Abnormal intrahepatic biliary dilatation, with the common bile duct dilated up to 1.3 cm. There is some faint heterogeneity in the common bile duct for example on image 49 series 601, measuring about 1.9 cm in length and about 1.2 cm in diameter. There is also abnormal increased density in the distal common bile duct near the ampulla, measuring about 1.6 cm in diameter, not present on the prior CT from 2009, believed to be causing obstruction. This could be due to stones or mass new. When I compare this density on portal venous and delayed phase images, I get about 106 Hounsfield units on portal venous phase and 102 on delayed phase, this might favor a lack of de-enhancement which would suggest gallstone rather than ampullary tumor, but is not considered highly reliable. Pancreas: No dorsal pancreatic duct dilatation. Clearly the density in the vicinity of the distal CBD/ampulla alsoshared physical location with the pancreatic head, but generally I would favor a non pancreatic etiology. This seems more intimately associated with the CBD. Spleen: Unremarkable Adrenals/Urinary Tract: 6 mm hypodense lesion of the left kidney upper pole, technically nonspecific although statistically likely to be cyst. 1.4 cm hypodense lesion of the right kidney lower pole, sharply defined and with fluid density on both acquisitions, favoring cyst. Wall thickening in the urinary bladder  may be due to underdistention. Stomach/Bowel: Small periampullary duodenal diverticulum, image 71/3. The cecum sits centrally in the anatomic pelvis, as before. Left colostomy. Presacral stranding and soft tissue density with a presacral fluid collection not changed from 2009. No findings of local recurrence. Rectum absent. Vascular/Lymphatic: Aortoiliac atherosclerotic vascular disease. The peripancreatic node measures 1.1 cm in short axis on image 60/3, previously 0.6 cm on 03/08/2013. A portacaval node measures 1.2 cm in short axis on image 64/3, previously 0.5 cm on 03/08/2013. Reproductive: There is some stranding adjacent to the seminal vesicles which is chronic and likely postoperative. Other: No supplemental non-categorized findings. Musculoskeletal: Bridging spurring of both sacroiliac joints. Transitional S1 vertebra with grade 1 anterolisthesis at L4-5 and spondylosis and degenerative disc disease causing impingement at L3-4, especially L4-5, and also L5-S1. IMPRESSION: 1. Biliary obstruction due to a 1.6 cm high density structure in the vicinity of the ampulla. Based on lack of change in density between portal venous and delayed phase images, I favor this as being a large gallstone. An ampullary tumor is not totally excluded and this warrants further workup. 2. There is also some faint high density material centrally in the common bile duct, possibly blood products or other gallstones. 3. No findings of  recurrent colon cancer. However, there are some enlarged lymph nodes in the peripancreatic/porta hepatis region, and some interval slight enlargement of an internal mammary lymph node. These may well be reactive but also may merit surveillance; if the CBD/ampullary lesion turns out to be neoplastic then these nodes would take on greater significance. 4. No change in the size of the ascending aortic aneurysm. Recommend annual imaging followup by CTA or MRA. This recommendation follows 2010  ACCF/AHA/AATS/ACR/ASA/SCA/SCAI/SIR/STS/SVM Guidelines for the Diagnosis and Management of Patients with Thoracic Aortic Disease. Circulation. 2010; 121: e266-e369 5. Other imaging findings of potential clinical significance: Coronary, aortic arch, and branch vessel atherosclerotic vascular disease. Thoracic and lumbar spondylosis causing impingement at L3-4, L4-5, and L5-S1 (assumes transitional S1 vertebra numbering). Small periampullary duodenal diverticulum. Stable presacral postoperative soft tissue density and fluid collection, no change from 2009. Aortoiliac atherosclerotic vascular disease. Electronically Signed   By: Van Clines M.D.   On: 06/08/2015 14:15   Ct Abdomen Pelvis W Contrast  06/08/2015  CLINICAL DATA:  Colon cancer in 2001 with colostomy, chemotherapy, and radiation therapy. Weight loss 40 pounds over the past year. EXAM: CT CHEST, ABDOMEN, AND PELVIS WITH CONTRAST TECHNIQUE: Multidetector CT imaging of the chest, abdomen and pelvis was performed following the standard protocol during bolus administration of intravenous contrast. CONTRAST:  11mL ISOVUE-300 IOPAMIDOL (ISOVUE-300) INJECTION 61% Creatinine was obtained on site at [place] at [address] Results: Creatinine 0.8 mg/dL. COMPARISON:  Multiple exams, including 03/08/2013 and 04/19/2015, among others FINDINGS: Despite efforts by the technologist and patient, motion artifact is present on today's exam and could not be eliminated. This reduces exam sensitivity and specificity. CT CHEST FINDINGS Mediastinum/Nodes: Coronary, aortic arch, and branch vessel atherosclerotic vascular disease. Ascending aortic caliber stable of 4.1 cm. There is a right internal mammary lymph node measuring 0.6 cm in short axis, image 22/3, previously 0.3 cm in short axis on 03/08/2013. Lungs/Pleura: Unremarkable Musculoskeletal: Bilateral degenerative glenohumeral and sternoclavicular arthropathy. Thoracic spondylosis. CT ABDOMEN  PELVIS FINDINGS Hepatobiliary: Abnormal intrahepatic biliary dilatation, with the common bile duct dilated up to 1.3 cm. There is some faint heterogeneity in the common bile duct for example on image 49 series 601, measuring about 1.9 cm in length and about 1.2 cm in diameter. There is also abnormal increased density in the distal common bile duct near the ampulla, measuring about 1.6 cm in diameter, not present on the prior CT from 2009, believed to be causing obstruction. This could be due to stones or mass new. When I compare this density on portal venous and delayed phase images, I get about 106 Hounsfield units on portal venous phase and 102 on delayed phase, this might favor a lack of de-enhancement which would suggest gallstone rather than ampullary tumor, but is not considered highly reliable. Pancreas: No dorsal pancreatic duct dilatation. Clearly the density in the vicinity of the distal CBD/ampulla alsoshared physical location with the pancreatic head, but generally I would favor a non pancreatic etiology. This seems more intimately associated with the CBD. Spleen: Unremarkable Adrenals/Urinary Tract: 6 mm hypodense lesion of the left kidney upper pole, technically nonspecific although statistically likely to be cyst. 1.4 cm hypodense lesion of the right kidney lower pole, sharply defined and with fluid density on both acquisitions, favoring cyst. Wall thickening in the urinary bladder may be due to underdistention. Stomach/Bowel: Small periampullary duodenal diverticulum, image 71/3. The cecum sits centrally in the anatomic pelvis, as before. Left colostomy. Presacral stranding and soft tissue density with a  presacral fluid collection not changed from 2009. No findings of local recurrence. Rectum absent. Vascular/Lymphatic: Aortoiliac atherosclerotic vascular disease. The peripancreatic node measures 1.1 cm in short axis on image 60/3, previously 0.6 cm on 03/08/2013. A portacaval node measures 1.2 cm in  short axis on image 64/3, previously 0.5 cm on 03/08/2013. Reproductive: There is some stranding adjacent to the seminal vesicles which is chronic and likely postoperative. Other: No supplemental non-categorized findings. Musculoskeletal: Bridging spurring of both sacroiliac joints. Transitional S1 vertebra with grade 1 anterolisthesis at L4-5 and spondylosis and degenerative disc disease causing impingement at L3-4, especially L4-5, and also L5-S1. IMPRESSION: 1. Biliary obstruction due to a 1.6 cm high density structure in the vicinity of the ampulla. Based on lack of change in density between portal venous and delayed phase images, I favor this as being a large gallstone. An ampullary tumor is not totally excluded and this warrants further workup. 2. There is also some faint high density material centrally in the common bile duct, possibly blood products or other gallstones. 3. No findings of recurrent colon cancer. However, there are some enlarged lymph nodes in the peripancreatic/porta hepatis region, and some interval slight enlargement of an internal mammary lymph node. These may well be reactive but also may merit surveillance; if the CBD/ampullary lesion turns out to be neoplastic then these nodes would take on greater significance. 4. No change in the size of the ascending aortic aneurysm. Recommend annual imaging followup by CTA or MRA. This recommendation follows 2010 ACCF/AHA/AATS/ACR/ASA/SCA/SCAI/SIR/STS/SVM Guidelines for the Diagnosis and Management of Patients with Thoracic Aortic Disease. Circulation. 2010; 121: e266-e369 5. Other imaging findings of potential clinical significance: Coronary, aortic arch, and branch vessel atherosclerotic vascular disease. Thoracic and lumbar spondylosis causing impingement at L3-4, L4-5, and L5-S1 (assumes transitional S1 vertebra numbering). Small periampullary duodenal diverticulum. Stable presacral postoperative soft tissue density and fluid collection, no  change from 2009. Aortoiliac atherosclerotic vascular disease. Electronically Signed   By: Van Clines M.D.   On: 06/08/2015 14:15   Mr 3d Recon At Scanner  06/13/2015  CLINICAL DATA:  70 year old male with a history of rectal cancer presents for evaluation of elevated liver function tests and high density structure in the region of the ampulla on recent CT study. EXAM: MRI ABDOMEN WITHOUT AND WITH CONTRAST (INCLUDING MRCP) TECHNIQUE: Multiplanar multisequence MR imaging of the abdomen was performed both before and after the administration of intravenous contrast. Heavily T2-weighted images of the biliary and pancreatic ducts were obtained, and three-dimensional MRCP images were rendered by post processing. CONTRAST:  79mL MULTIHANCE GADOBENATE DIMEGLUMINE 529 MG/ML IV SOLN COMPARISON:  06/08/2015 CT chest, abdomen and pelvis. FINDINGS: Lower chest: Clear lung bases. Hepatobiliary: Normal liver size and configuration. No hepatic steatosis. No liver mass. Normal gallbladder with no cholelithiasis. There is mild to moderate diffuse intrahepatic biliary ductal dilatation. Common bile duct diameter 12 mm. There are adjacent mobile 10 x 8 mm and 2 mm choledocholiths in the lower third of the common bile duct. There is a separate 8 x 6 mm choledocholith lodged at the junction of the common bile duct and ampulla (series 4/image 81). No biliary or ampullary mass. Pancreas: No pancreatic mass or duct dilation.  No pancreas divisum. Spleen: Normal size. No mass. Adrenals/Urinary Tract: Normal adrenals. No hydronephrosis. Simple 1.4 cm renal cyst in the lower right kidney. Simple 0.8 cm renal cyst in the upper left kidney. No suspicious renal masses. Stomach/Bowel: Grossly normal stomach. Visualized small and large bowel is normal caliber,  with no bowel wall thickening. Patient is status post partially visualized distal colectomy with ventral left lower abdominal wall end colostomy. Vascular/Lymphatic: Atherosclerotic  nonaneurysmal abdominal aorta. Patent portal, splenic, hepatic and renal veins. Mildly enlarged 1.1 cm porta hepatis node (series 1304/ image 54). Other: No abdominal ascites or focal fluid collection. Musculoskeletal: No aggressive appearing focal osseous lesions. IMPRESSION: 1. Obstructing choledocholithiasis in the lower third of the common bile duct as described. Common bile duct diameter 12 mm. Mild-to-moderate diffuse intrahepatic biliary ductal dilatation. No evidence of a mass obstructing the biliary tree. 2. Mild porta hepatis lymphadenopathy, nonspecific. Recommend attention on follow-up CT abdomen/pelvis with IV and oral contrast in 3 months. Electronically Signed   By: Ilona Sorrel M.D.   On: 06/13/2015 11:16   Dg Ercp Biliary & Pancreatic Ducts  06/29/2015  CLINICAL DATA:  Evaluate patency of the CBD. Several biliary balloon sweeps performed with 2 stones removed. EXAM: ERCP TECHNIQUE: Multiple spot images obtained with the fluoroscopic device and submitted for interpretation post-procedure. COMPARISON:  Abdominal MRI - 06/13/2015; CT abdomen pelvis - 06/08/2015 FINDINGS: 6 seconds 11 spot intraoperative fluoroscopic images of the right upper abdominal quadrant during ERCP are provided for review. Initial image demonstrates an ERCP probe overlying the right upper abdominal quadrant. Selective images demonstrate apparent selective cannulation and opacification of a moderately dilated common bile duct. There are several persistent near occlusive filling defects within the distal aspect of the CBD compatible with findings of choledocholithiasis as demonstrated on preceding MRCP. Subsequent images demonstrate insufflation of a balloon within the central aspect of the common bile duct with subsequent presumed biliary sweeping and presumed sphincterotomy. IMPRESSION: ERCP with findings of choledocholithiasis with subsequent biliary sweeping and presumed sphincterotomy. These images were submitted for  radiologic interpretation only. Please see the procedural report for the amount of contrast and the fluoroscopy time utilized. Electronically Signed   By: Sandi Mariscal M.D.   On: 06/29/2015 15:04   Mr Abd W/wo Cm/mrcp  06/13/2015  CLINICAL DATA:  70 year old male with a history of rectal cancer presents for evaluation of elevated liver function tests and high density structure in the region of the ampulla on recent CT study. EXAM: MRI ABDOMEN WITHOUT AND WITH CONTRAST (INCLUDING MRCP) TECHNIQUE: Multiplanar multisequence MR imaging of the abdomen was performed both before and after the administration of intravenous contrast. Heavily T2-weighted images of the biliary and pancreatic ducts were obtained, and three-dimensional MRCP images were rendered by post processing. CONTRAST:  68mL MULTIHANCE GADOBENATE DIMEGLUMINE 529 MG/ML IV SOLN COMPARISON:  06/08/2015 CT chest, abdomen and pelvis. FINDINGS: Lower chest: Clear lung bases. Hepatobiliary: Normal liver size and configuration. No hepatic steatosis. No liver mass. Normal gallbladder with no cholelithiasis. There is mild to moderate diffuse intrahepatic biliary ductal dilatation. Common bile duct diameter 12 mm. There are adjacent mobile 10 x 8 mm and 2 mm choledocholiths in the lower third of the common bile duct. There is a separate 8 x 6 mm choledocholith lodged at the junction of the common bile duct and ampulla (series 4/image 81). No biliary or ampullary mass. Pancreas: No pancreatic mass or duct dilation.  No pancreas divisum. Spleen: Normal size. No mass. Adrenals/Urinary Tract: Normal adrenals. No hydronephrosis. Simple 1.4 cm renal cyst in the lower right kidney. Simple 0.8 cm renal cyst in the upper left kidney. No suspicious renal masses. Stomach/Bowel: Grossly normal stomach. Visualized small and large bowel is normal caliber, with no bowel wall thickening. Patient is status post partially visualized distal colectomy with  ventral left lower abdominal  wall end colostomy. Vascular/Lymphatic: Atherosclerotic nonaneurysmal abdominal aorta. Patent portal, splenic, hepatic and renal veins. Mildly enlarged 1.1 cm porta hepatis node (series 1304/ image 54). Other: No abdominal ascites or focal fluid collection. Musculoskeletal: No aggressive appearing focal osseous lesions. IMPRESSION: 1. Obstructing choledocholithiasis in the lower third of the common bile duct as described. Common bile duct diameter 12 mm. Mild-to-moderate diffuse intrahepatic biliary ductal dilatation. No evidence of a mass obstructing the biliary tree. 2. Mild porta hepatis lymphadenopathy, nonspecific. Recommend attention on follow-up CT abdomen/pelvis with IV and oral contrast in 3 months. Electronically Signed   By: Ilona Sorrel M.D.   On: 06/13/2015 11:16     PERTINENT LAB RESULTS: CBC:  Recent Labs  06/29/15 1340 06/30/15 0326  WBC 6.2 5.8  HGB 12.0* 9.8*  HCT 37.6* 30.2*  PLT 155 112*   CMET CMP     Component Value Date/Time   NA 141 06/30/2015 0326   K 4.4 06/30/2015 0326   CL 105 06/30/2015 0326   CO2 32 06/30/2015 0326   GLUCOSE 103* 06/30/2015 0326   BUN 12 06/30/2015 0326   CREATININE 0.74 06/30/2015 0326   CALCIUM 8.3* 06/30/2015 0326   PROT 5.7* 06/30/2015 0326   ALBUMIN 2.3* 06/30/2015 0326   AST 89* 06/30/2015 0326   ALT 47 06/30/2015 0326   ALKPHOS 286* 06/30/2015 0326   BILITOT 4.6* 06/30/2015 0326   GFRNONAA >60 06/30/2015 0326   GFRAA >60 06/30/2015 0326    GFR Estimated Creatinine Clearance: 87.4 mL/min (by C-G formula based on Cr of 0.74).  Recent Labs  06/29/15 1340  LIPASE 23   No results for input(s): CKTOTAL, CKMB, CKMBINDEX, TROPONINI in the last 72 hours. Invalid input(s): POCBNP No results for input(s): DDIMER in the last 72 hours. No results for input(s): HGBA1C in the last 72 hours. No results for input(s): CHOL, HDL, LDLCALC, TRIG, CHOLHDL, LDLDIRECT in the last 72 hours. No results for input(s): TSH, T4TOTAL, T3FREE,  THYROIDAB in the last 72 hours.  Invalid input(s): FREET3 No results for input(s): VITAMINB12, FOLATE, FERRITIN, TIBC, IRON, RETICCTPCT in the last 72 hours. Coags: No results for input(s): INR in the last 72 hours.  Invalid input(s): PT Microbiology: No results found for this or any previous visit (from the past 240 hour(s)).   BRIEF HOSPITAL COURSE:  Transient Acute encephalopathy with chills:Resolved, occurred post ERCP. None since admission. Suspect this is related to sedation/anesthesia provided during ERCP, although he could have bacteremia-he does not appear to have fever and look toxic during my exam. Blood cultures are still pending. GI recommends that we discharge him on a 5 day course of Cipro. Spouse and patient aware that blood cultures will need to be followed by PCP. Note by day of discharge, awake alert, ambulating independently in the hallway.  Choledocholithiasis with obstructive jaundice: Status post ERCP.LFT's down trending-will require follow up  History of bipolar disorder: Continue Wellbutrin and Depakote.  Dyslipidemia: hold statins still LFT's further down trend.   TODAY-DAY OF DISCHARGE:  Subjective:   Asean Inghram today has no headache,no chest abdominal pain,no new weakness tingling or numbness, feels much better wants to go home today.  Objective:   Blood pressure 110/57, pulse 63, temperature 97.7 F (36.5 C), temperature source Oral, resp. rate 18, height 5\' 10"  (1.778 m), weight 73.6 kg (162 lb 4.1 oz), SpO2 99 %.  Intake/Output Summary (Last 24 hours) at 06/30/15 1150 Last data filed at 06/30/15 0806  Gross per 24  hour  Intake   1675 ml  Output   2255 ml  Net   -580 ml   Filed Weights   06/29/15 1059 06/29/15 1717  Weight: 72.576 kg (160 lb) 73.6 kg (162 lb 4.1 oz)    Exam Awake Alert, Oriented *3, No new F.N deficits, Normal affect [place].AT,PERRAL Supple Neck,No JVD, No cervical lymphadenopathy appriciated.  Symmetrical Chest wall movement,  Good air movement bilaterally, CTAB RRR,No Gallops,Rubs or new Murmurs, No Parasternal Heave +ve B.Sounds, Abd Soft, Non tender, No organomegaly appriciated, No rebound -guarding or rigidity. No Cyanosis, Clubbing or edema, No new Rash or bruise  DISCHARGE CONDITION: Stable  DISPOSITION: Home  DISCHARGE INSTRUCTIONS:    Activity:  As tolerated   Get Medicines reviewed and adjusted: Please take all your medications with you for your next visit with your Primary MD  Please request your Primary MD to go over all hospital tests and procedure/radiological results at the follow up, please ask your Primary MD to get all Hospital records sent to his/her office.  If you experience worsening of your admission symptoms, develop shortness of breath, life threatening emergency, suicidal or homicidal thoughts you must seek medical attention immediately by calling 911 or calling your MD immediately  if symptoms less severe.  You must read complete instructions/literature along with all the possible adverse reactions/side effects for all the Medicines you take and that have been prescribed to you. Take any new Medicines after you have completely understood and accpet all the possible adverse reactions/side effects.   Do not drive when taking Pain medications.   Do not take more than prescribed Pain, Sleep and Anxiety Medications  Special Instructions: If you have smoked or chewed Tobacco  in the last 2 yrs please stop smoking, stop any regular Alcohol  and or any Recreational drug use.  Wear Seat belts while driving.  Please note  You were cared for by a hospitalist during your hospital stay. Once you are discharged, your primary care physician will handle any further medical issues. Please note that NO REFILLS for any discharge medications will be authorized once you are discharged, as it is imperative that you return to your primary care physician (or establish a relationship with a primary care  physician if you do not have one) for your aftercare needs so that they can reassess your need for medications and monitor your lab values.   Diet recommendation: Heart Healthy diet  Discharge Instructions    Call MD for:  persistant nausea and vomiting    Complete by:  As directed      Call MD for:  temperature >100.4    Complete by:  As directed      Diet - low sodium heart healthy    Complete by:  As directed      Increase activity slowly    Complete by:  As directed            Follow-up Information    Follow up with Shirline Frees, MD. Go on 07/03/2015.   Specialty:  Family Medicine   Why:  follow blood cultures and LFT's   Contact information:   [place]  [phone]       Follow up with [facility].   Specialty:  Gastroenterology   Why:  Office will call with date/time, If you do not hear from them in 1 week,please giv   Contact information:   [address] [place] [phone]  Total Time spent on discharge equals 25 minutes.  SignedOren Binet 06/30/2015 11:50 AM

## 2015-06-30 NOTE — Progress Notes (Signed)
Patient ID: KARA HOLLIGAN, male   DOB: 1945-02-27, 70 y.o.   MRN: [id number]    Progress Note   Subjective   Up walking halls, c/o sore throat ,but no abdominal  discomfort, ate breakfast  Wife says he seems to be at his baseline mentally T 99.7 last pm, no fever  since [facility] pending   Objective   Vital signs in last 24 hours: Temp:  [97.6 F (36.4 C)-99.7 F (37.6 C)] 97.7 F (36.5 C) (05/19 0439) Pulse Rate:  [62-99] 63 (05/19 0439) Resp:  [12-18] 18 (05/19 0439) BP: (110-197)/(56-92) 110/57 mmHg (05/19 0439) SpO2:  [94 %-100 %] 99 % (05/19 0439) Weight:  [160 lb (72.576 kg)-162 lb 4.1 oz (73.6 kg)] 162 lb 4.1 oz (73.6 kg) (05/18 1717) Last BM Date: 06/30/15 General:    white male in NAD,sl icteric Heart:  Regular rate and rhythm; no murmurs Lungs: Respirations even and unlabored, lungs CTA bilaterally Abdomen:  Soft, nontender and nondistended. Normal bowel sounds. Extremities:  Without edema. Neurologic:  Alert and oriented,  grossly normal neurologically. Psych:  Cooperative. Normal mood and affect.  Intake/Output from previous day: 05/18 0701 - 05/19 0700 In: 1195 [P.O.:595; I.V.:600] Out: 1655 [Urine:1650; Blood:5] Intake/Output this shift: Total I/O In: 480 [P.O.:480] Out: 600 [Urine:600]  Lab Results:  Recent Labs  06/29/15 1340 06/30/15 0326  WBC 6.2 5.8  HGB 12.0* 9.8*  HCT 37.6* 30.2*  PLT 155 112*   BMET  Recent Labs  06/29/15 1340 06/30/15 0326  NA 140 141  K 4.4 4.4  CL 104 105  CO2 29 32  GLUCOSE 127* 103*  BUN 16 12  CREATININE 0.89 0.74  CALCIUM 8.5* 8.3*   LFT  Recent Labs  06/30/15 0326  PROT 5.7*  ALBUMIN 2.3*  AST 89*  ALT 47  ALKPHOS 286*  BILITOT 4.6*   PT/INR No results for input(s): LABPROT, INR in the last 72 hours.  Studies/Results: Dg Ercp Biliary & Pancreatic Ducts  06/29/2015  CLINICAL DATA:  Evaluate patency of the CBD. Several biliary balloon sweeps performed with 2 stones removed. EXAM: ERCP TECHNIQUE:  Multiple spot images obtained with the fluoroscopic device and submitted for interpretation post-procedure. COMPARISON:  Abdominal MRI - 06/13/2015; CT abdomen pelvis - 06/08/2015 FINDINGS: 6 seconds 11 spot intraoperative fluoroscopic images of the right upper abdominal quadrant during ERCP are provided for review. Initial image demonstrates an ERCP probe overlying the right upper abdominal quadrant. Selective images demonstrate apparent selective cannulation and opacification of a moderately dilated common bile duct. There are several persistent near occlusive filling defects within the distal aspect of the CBD compatible with findings of choledocholithiasis as demonstrated on preceding MRCP. Subsequent images demonstrate insufflation of a balloon within the central aspect of the common bile duct with subsequent presumed biliary sweeping and presumed sphincterotomy. IMPRESSION: ERCP with findings of choledocholithiasis with subsequent biliary sweeping and presumed sphincterotomy. These images were submitted for radiologic interpretation only. Please see the procedural report for the amount of contrast and the fluoroscopy time utilized. Electronically Signed   By: Sandi Mariscal M.D.   On: 06/29/2015 15:04       Assessment / Plan:    #1 70 yo male stable s/p ERCP and stone extraction x 2 yesterday . Admitted on observation post procedure because of prolonged shaking chill and confusion  Mental status has returned to baseline [facility] pending- labs look good, no fever, no abdominal pain ? Transient bacteremia   Plan is for pt to go home  today, will discuss but would favor sending him home on short course of Cipro 500 BID x 5 days, until [facility] final.  Active Problems:   Parkinson's disease (tremor, stiffness, slow motion, unstable posture) ([place])   Choledocholithiasis   Dilated bile duct   Acute encephalopathy   Encephalopathy       Ciana Simmon  06/30/2015, 8:58 AM

## 2015-07-03 ENCOUNTER — Other Ambulatory Visit: Payer: Self-pay

## 2015-07-03 DIAGNOSIS — K8050 Calculus of bile duct without cholangitis or cholecystitis without obstruction: Secondary | ICD-10-CM

## 2015-07-03 DIAGNOSIS — R945 Abnormal results of liver function studies: Principal | ICD-10-CM

## 2015-07-03 DIAGNOSIS — R7989 Other specified abnormal findings of blood chemistry: Secondary | ICD-10-CM

## 2015-07-03 NOTE — Progress Notes (Signed)
Final Blood culture results show E Coli-sensitive to Cipro. Patient was discharged on 5 days of Cipro, I have called in another 5 days of Cipro to complete a 10 day course. Spoke with patients spouse [name]-patient doing well-with no fever or chills.

## 2015-07-04 LAB — CULTURE, BLOOD (ROUTINE X 2): Culture: NO GROWTH

## 2015-07-05 ENCOUNTER — Other Ambulatory Visit: Payer: Self-pay

## 2015-07-05 DIAGNOSIS — I10 Essential (primary) hypertension: Secondary | ICD-10-CM

## 2015-07-05 DIAGNOSIS — G308 Other Alzheimer's disease: Secondary | ICD-10-CM

## 2015-07-05 DIAGNOSIS — F0150 Vascular dementia without behavioral disturbance: Secondary | ICD-10-CM | POA: Insufficient documentation

## 2015-07-05 DIAGNOSIS — F0281 Dementia in other diseases classified elsewhere with behavioral disturbance: Secondary | ICD-10-CM

## 2015-07-05 DIAGNOSIS — F02818 Dementia in other diseases classified elsewhere, unspecified severity, with other behavioral disturbance: Secondary | ICD-10-CM

## 2015-07-05 DIAGNOSIS — R296 Repeated falls: Secondary | ICD-10-CM | POA: Insufficient documentation

## 2015-07-05 NOTE — Patient Outreach (Addendum)
[facility]) Care Management  07/05/2015  Ricky Conley August 27, 1945 [id number]   Telephonic Monthly Assessment   Call completed with patient's wife, [name].  [name] states preferred call time Monday - Wednesday at home or cell.   Subjective:   Wife reports recent admission.  States patient gets ill with her and she has to coach him on how she is trying to help patient.    Providers: Primary MD: Dr. Shirline Frees - last appt: 05/2015/ next appt:07/17/2015 [facility], Dr. Fuller Plan,  [phone] - next appt 07/19/15   Oncologist: Dr. Benay Spice - last appt 06/12/15 Neurologist: Eustace Quail Tat, DO next appt 07/11/2015 Psychiatrist:  Dr. Norma Fredrickson [phone] - last appt 05/22/15 / next appt 07/06/15 HH: none - (Wife states patient will not agree to home visits but calls are ok for now.  Wife is working with patient on trying to help him to understand home visits / [facility] services are sometimes necessary).    Insurance: Wife verified patient no longer has [employer] and now has [employer].   Social: H/o Patient lives in the home with wife/Ricky Conley and is [facility] and wife completes calls or supplements details.  Patient has dementia and memory issues. Wife works on Thursday and Fridays only; patient is alone during these hours. [name] states preferred call time Monday - Wednesday at home or cell.   Mobility: Uses a walker or cane but often forgets to use .  Falls:  Frequently; last fall date 06/28/15 and fell 3 times that day.  Wife states patient is never sure of the reason for his falls.  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 HOH: Using a cheap Aid due to cost and patient misplaces too often. Safety:  MD / Neurologist has advised patient should not be driving.  Wife has taken car keys.  Patient is upset that he is no longer able to drive.  Pain: None H/o OA but off Meloxicam since 03/2015 and doing ok.  Depression, Anxiety, Bipolar, Alzheimer.   Neuropsychiatric testing completed 06/20/15.  Patient/caregiver advised of new diagnosis:  Bipolar Alzheimer. H/o Bipolar: managed with Wellbutrin, Depakote and Klonopin. Transportation: Yes, wife Caregiver: wife, [name] Advance Directive: yes  Consent: Wife consents to services.  DME: cane, walker, eyeglasses, hearing aid, Colostomy  Co-morbidities:  H/o PMH:  Diet-controlled DM Type II,  HDL, HTN, Ascending aortic aneurysm  (followed by Dr. Cyndia Bent- last check 2 years ago 2015), Gallstones,  common bile duct; Rectal cancer, Colon cancer, H/o Colectomy, (chemo and radiation for colon cancer (2001),  Arthritis, Anxiety, Depression, Bipolar disorder (tx Depakote),  Bipolar,  Alzheimer, Dementia, Neuromuscular disorder (rectal nerve damage), Chronic pain (tx: Gabapentin); Impaired hearing (left hearing aid).  H/o PD has been ruled out per wife.  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  Admission History:  ER:  0 / Admissions: 3 over past 6 months.  Recent admission:  06/29/2015 - 06/30/2015 Parkinson's disease (tremor, stiffness, slow motion, unstable posture), Choledocholithiasis, Dilated bile duct, Acute encephalopathy, Encephalopathy Wife states patient was scheduled for gallstone removal 06/29/2015 but had slow anesthesia recovery and needed to be admitted.  -Follow-up with [facility], Dr. Fuller Plan,  [phone] - Next appt 07/19/15 3 pm   -Follow-up with Dr. Shirline Frees, Family Medicine 07/03/2015, [phone]   Wife states she was not aware of the post hospital appt scheduled on 07/03/2015.  States MD office did call her on 07/03/15 and scheduled appt for 07/17/2015 at 2:00 pm.  -Wife states Hospitalist  has called since discharge and advised that a new prescription order has been called for another 5 days of medication.  Wife is not sure of the name of the medication yet but thinks it will be an antibiotic.  Per MR:  H/o prelim blood cultures are positive for E Coli  managed with Cipro.  Patient to continue Cipro until sensitivity results are back.   MD will call a a 2 week course of Abx if positive.  Wife was able to verbalize understanding of MDs phone instructions.   Post Discharge instructions: Follow blood cultures and LFT's. Follow blood cultures till final Resume Statins when LFT's better-follow LFT's till they  have normalized  Previous admission date:   04/18/15 - 04/21/15 Unsteady gait. Patient discharged to [facility]. 04/09/2015 - 04/12/2015  Altered mental status, Fever, Influenza-like illness, Delirium  Medications:  Patient taking less than 15 medications  Co-pay cost issues: no Flu Vaccine: 12/06/14 Pneumonia Vaccine: Pneumovax 03/09/2012 Wife places medications into a container for patient to take meds from. Puts out in container.  Wife manages medications due to patient will sometimes take extra medicine due to his memory issues.   -Patient was recently discharged from hospital and all medications have been reviewed. Patient remains on pravastatin (PRAVACHOL) 80 MG tabletwhich was discontinued and ordered to stop taking on discharge summary.     RN CM confirmed that wife was not aware that patient was not supposed to be taking Pravachol.  Wife states there was so much going on at the time she was receiving discharge instructions that she just missed that.   Wife confirmed she would stop giving.   RN CM provided education on reason to stop medication and that MD will review LFT's on future appt's and advise when it is safe to resume Pravachol.   Encounter Medications:  Outpatient Encounter Prescriptions as of 07/05/2015  Medication Sig Note  . amLODipine (NORVASC) 5 MG tablet Take 1 tablet (5 mg total) by mouth daily.   Marland Kitchen aspirin 81 MG EC tablet Take 81 mg by mouth every morning.    Marland Kitchen buPROPion (WELLBUTRIN XL) 300 MG 24 hr tablet Take 300 mg by mouth every morning.   . cholecalciferol (VITAMIN D) 1000 units tablet Take  1,000 Units by mouth daily.   . ciprofloxacin (CIPRO) 500 MG tablet Take 1 tablet (500 mg total) by mouth 2 (two) times daily.   . clonazePAM (KLONOPIN) 2 MG tablet Take 0.5 tablets (1 mg total) by mouth 3 (three) times daily. 06/20/2015: He takes on a daily basis.  Marland Kitchen divalproex (DEPAKOTE ER) 500 MG 24 hr tablet Take 1,500 mg by mouth every morning. 06/29/2015: Only one tab given this am  . gabapentin (NEURONTIN) 300 MG capsule Take 300-600 mg by mouth 3 (three) times daily. Take one capsule in the morning, two capsules in the afternoon and one capsule in the evening.   Marland Kitchen Hydrocortisone (0000000 EX) Apply 1 application topically daily as needed (Applies to stoma for redness.).   Marland Kitchen Multiple Vitamin (MULTIVITAMIN) tablet Take 1 tablet by mouth daily.    No facility-administered encounter medications on file as of 07/05/2015.    Functional Status:  In your present state of health, do you have any difficulty performing the following activities: 07/05/2015 06/29/2015  Hearing? N Y  Vision? N N  Difficulty concentrating or making decisions? Tempie Donning  Walking or climbing stairs? Y Y  Dressing or bathing? Y Y  Doing errands, shopping? Tempie Donning  Preparing Food  and eating ? N -  Using the Toilet? N -  In the past six months, have you accidently leaked urine? N -  Do you have problems with loss of bowel control? N -  Managing your Medications? Y -  Managing your Finances? Y -  Housekeeping or managing your Housekeeping? Y -    Fall/Depression Screening: PHQ 2/9 Scores 06/15/2015  PHQ - 2 Score 0   Fall Risk  07/05/2015 06/15/2015  Falls in the past year? Yes Yes  Number falls in past yr: 2 or more 2 or more  Injury with Fall? No No  Risk Factor Category  High Fall Risk High Fall Risk  Risk for fall due to : History of fall(s);Impaired balance/gait;Impaired mobility;Other (Comment) History of fall(s);Impaired balance/gait;Impaired mobility;Other (Comment)  Risk for fall due to (comments): Neurological  condition Parkinson's  Follow up Falls evaluation completed;Education provided;Falls prevention discussed Falls evaluation completed;Education provided;Falls prevention discussed    Assessment Post-hospital medication error discovered.  Post-hospital Primary MD appt missed.   Patient and wife in need of healthcare management to address goals of care and LTC needs options to best care for patient in the home versus ALF, SNF, LTC services.  Patient needs medication system assessment to determine what options patient may have to prevent medication error or taking wrong med or too much medication.  RN CM will review PD medications and dosing schedule to insure patient's PD symptoms getting best clinical response to avoid risk of falls.   Plan:  Referral Date: 06/02/2015 Source: [facility] / [facility]  Issue: Disease and symptom management. [facility] lost contact with member. Nursing Center altered mental status 3 in-patient LOS 17; SNF Comorbidity: Cancer, Dementia, ER 2, LACE 3  Referral Date: 06/02/2015 Screening and Initial Assessment 06/15/2015 Program: Other: Fall Risk, PD, dementia, medication management 06/15/2015 - 07/05/15 [facility] - 06/15/2015 - 07/05/15  [facility] RN CM Referral  -Transition of care. (Wife states patient will not agree to home visits but calls are ok for now.  Wife is working with patient on trying to help him to understand home visits / [facility] services are sometimes necessary).    RN CM advised in next [facility] contact for Transition of Care Services within the next week.  RN CM advised to please notify MD of any changes in condition prior to scheduled appt's. -persistent fever -change in mental status or any worsening -take patient back to the ED immediately if these symptoms occur. RN CM provided contact name and # [phone] or main office # [phone] and 24-hour nurse line # [phone].  RN CM confirmed patient is aware of 911 services for urgent  emergency needs.  Mariann Laster, RN, BSN, [facility], CCM  [employer] Management Coordinator [phone] Direct [phone] Cell [phone] Office [phone] Fax

## 2015-07-06 DIAGNOSIS — F3342 Major depressive disorder, recurrent, in full remission: Secondary | ICD-10-CM | POA: Diagnosis not present

## 2015-07-07 ENCOUNTER — Other Ambulatory Visit: Payer: Self-pay | Admitting: *Deleted

## 2015-07-07 NOTE — Patient Outreach (Signed)
Transition of care call attempted. [name] was at work and couldn't talk to me at length. She said everything is OK with [name]. I told her I will call her next Tuesday and she agreed.  Deloria Lair [facility] [phone]

## 2015-07-11 ENCOUNTER — Encounter: Payer: Self-pay | Admitting: Neurology

## 2015-07-11 ENCOUNTER — Ambulatory Visit: Payer: PPO

## 2015-07-11 ENCOUNTER — Encounter: Payer: Self-pay | Admitting: *Deleted

## 2015-07-11 ENCOUNTER — Other Ambulatory Visit: Payer: Self-pay | Admitting: *Deleted

## 2015-07-11 ENCOUNTER — Ambulatory Visit (INDEPENDENT_AMBULATORY_CARE_PROVIDER_SITE_OTHER): Payer: PPO | Admitting: Neurology

## 2015-07-11 VITALS — BP 128/62 | HR 75 | Ht 70.0 in | Wt 162.0 lb

## 2015-07-11 DIAGNOSIS — R482 Apraxia: Secondary | ICD-10-CM

## 2015-07-11 DIAGNOSIS — F0150 Vascular dementia without behavioral disturbance: Secondary | ICD-10-CM

## 2015-07-11 MED ORDER — DONEPEZIL HCL 5 MG PO TABS: 5.0000 mg | ORAL_TABLET | Freq: Every day | ORAL | Status: DC

## 2015-07-11 MED ORDER — DONEPEZIL HCL 10 MG PO TABS: 10.0000 mg | ORAL_TABLET | Freq: Every day | ORAL | Status: DC

## 2015-07-11 NOTE — Progress Notes (Signed)
Ricky Conley was seen today in the movement disorders clinic for neurologic consultation at the request of Shirline Frees, MD.  The consultation is for the evaluation of Parkinsons disease.  The patient is seen today after his recent hospitalizations.  I have reviewed hospital records made available to me.  He is accompanied by his wife who supplements the history.    He was hospitalized from February 26 to March 1 after mental status change, which was ultimately determined due to influenza.  He returned back to the hospital on March 7 to March 9 with increasing weakness.  He ended up seeing neurology and was felt that he possibly had Parkinson's disease or apraxia of gait.  He was given a trial of low dose levodopa (6am/2pm/10pm).  When they followed up with him the following day, he was not able to articulate whether this helped or not, primarily because the patient was a poor historian.   He does state that today that his walking is 200% better and he no longer has to use the walker.  His wife states that prior to his hospitalization he couldn't sit up on the side of the bed because he would fall over but he doesn't do that.  His wife states that about 2 years ago, he would start having trouble getting up if he was exhausted or if he was sick.   He also has a history of bipolar disorder and was on Depakote and clonazepam, which were to be monitored closely as an outpatient as the patient's liver enzymes were high when he was in the hospital.   07/11/15 update:  The patient is following up today, accompanied by his wife who supplements the history.  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  I weaned him off of levodopa.  He is doing well off of this.   He had some lab work.  [name] was 747.  His RPR was negative.  He saw Dr. Richrd Sox for testing on May 9 and follow-up on May 19.  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  She recommended that he discontinue driving.  She told his wife to remove the guns from the home, which was done.  She also recommended that he follow up with Dr. Casimiro Needle and perhaps consider changing his clonazepam to something else.  He is in the process of reducing it.  Pt states "my memory is much better" but his wife does wink at me.  He was on 2 mg tid but has decreased to 1 mg bid.  I did review the records since last visit.  He went to the hospital on May 18 and ended up having an ERCP and 2 stones extracted.   ALLERGIES:  No Known Allergies  CURRENT MEDICATIONS:  Outpatient Encounter Prescriptions as of 07/11/2015  Medication Sig  . amLODipine (NORVASC) 5 MG tablet Take 1 tablet (5 mg total) by mouth daily.  Marland Kitchen aspirin 81 MG EC tablet Take 81 mg by mouth every morning.   Marland Kitchen buPROPion (WELLBUTRIN XL) 300 MG 24 hr tablet Take 300 mg by mouth every morning.  . cholecalciferol (VITAMIN D) 1000 units tablet Take 1,000 Units by mouth daily.  . clonazePAM (KLONOPIN) 2 MG tablet Take 0.5 tablets (1 mg total) by mouth 3 (three) times daily. (Patient taking differently:  Take 1 mg by mouth 2 (two) times daily. )  . divalproex (DEPAKOTE ER) 500 MG 24 hr tablet Take 1,500 mg by mouth every morning.  . gabapentin (NEURONTIN) 300 MG capsule Take 300 mg by mouth 3 (three) times daily. Take one capsule in the morning, two capsules in the afternoon and one capsule in the evening.  Marland Kitchen Hydrocortisone (0000000 EX) Apply 1 application topically daily as needed (Applies to stoma for redness.).  Marland Kitchen Multiple Vitamin (MULTIVITAMIN) tablet Take 1 tablet by mouth daily.  . [DISCONTINUED] ciprofloxacin (CIPRO) 500 MG tablet Take 1 tablet (500 mg total) by mouth 2 (two) times daily.   No facility-administered encounter medications on file as of 07/11/2015.    PAST MEDICAL HISTORY:   Past Medical History  Diagnosis  Date  . Arthritis   . Anxiety   . Depression   . Hyperlipidemia   . Diet-controlled diabetes mellitus ([place])   . Hypertension   . Ascending aortic aneurysm (H[facility])     Dr. Cyndia Bent follows- last check 2 yrs.  . Diabetes mellitus without complication ([place])     Type II " controlled with diet"  . Gallstones, common bile duct   . Rectal cancer ([place])   . Colon cancer [facility]) 2001    Chemotherapy, radiation- no longer seeing oncology  . Impaired hearing     wears hearing aid left ear.  . Bipolar disorder ([place])     tx Depakote  . Neuromuscular disorder ([place])     rectal nerve damage' oversewed" chronic pain tx. Gabapentin.    PAST SURGICAL HISTORY:   Past Surgical History  Procedure Laterality Date  . Colonoscopy    . Back surgery  2003  . Colectomy  2001  . Colostomy  2001  . Carpal tunnel release Bilateral 2008  . Fracture surgery Left 2009    ankle with hardware  . Ercp N/A 06/29/2015    Procedure: ENDOSCOPIC RETROGRADE CHOLANGIOPANCREATOGRAPHY (ERCP);  Surgeon: Ladene Artist, MD;  Location: [facility];  Service: Endoscopy;  Laterality: N/A;    SOCIAL HISTORY:   Social History   Social History  . Marital Status: Married    Spouse Name: [name]  . Number of Children: 4  . Years of Education: N/A   Occupational History  . retired     Curator   Social History Main Topics  . Smoking status: Former Smoker    Quit date: 05/30/1970  . Smokeless tobacco: Former Systems developer    Quit date: 05/30/1998     Comment: quit chewing tobacco approx 2012  . Alcohol Use: No  . Drug Use: No  . Sexual Activity: Not on file   Other Topics Concern  . Not on file   Social History Narrative   Married to wife, [name]   Four grown children   HOH and frequent falls   Memory deficit and confusion    FAMILY HISTORY:   Family Status  Relation Status Death Age  . Mother Deceased     alzheimers, HTN, depression  . Father Deceased     stroke  . Sister Alive     x2 - skin cancer  . Brother  Alive     x1 - stroke  . Child Alive     2 sons, 2 daughters - alive and well    ROS:  A complete 10 system review of systems was obtained and was unremarkable apart from what is mentioned above.  PHYSICAL EXAMINATION:    VITALS:   Filed Vitals:  07/11/15 0833  BP: 128/62  Pulse: 75  Height: 5\' 10"  (1.778 m)  Weight: 162 lb (73.483 kg)    GEN:  The patient appears stated age and is in NAD. HEENT:  Normocephalic, atraumatic.  The mucous membranes are moist. The superficial temporal arteries are without ropiness or tenderness. CV:  RRR Lungs:  CTAB Neck/HEME:  There are no carotid bruits bilaterally.  Neurological examination:  Orientation:  Montreal Cognitive Assessment  05/04/2015  Visuospatial/ Executive (0/5) 2  Naming (0/3) 3  Attention: Read list of digits (0/2) 2  Attention: Read list of letters (0/1) 1  Attention: Serial 7 subtraction starting at 100 (0/3) 1  Language: Repeat phrase (0/2) 2  Language : Fluency (0/1) 0  Abstraction (0/2) 1  Delayed Recall (0/5) 1  Orientation (0/6) 5  Total 18  Adjusted Score (based on education) 19   Cranial nerves: There is good facial symmetry. Pupils are equal round and reactive to light bilaterally. Fundoscopic exam is attempted but the disc margins are not well visualized bilaterally. Extraocular muscles are intact. The visual fields are full to confrontational testing. The speech is fluent and clear. Soft palate rises symmetrically and there is no tongue deviation. Hearing is decreased to conversational tone. Sensation: Sensation is intact to light and pinprick throughout (facial, trunk, extremities). Vibration is intact at the bilateral big toe. There is no extinction with double simultaneous stimulation. There is no sensory dermatomal level identified. Motor: Strength is 5/5 in the bilateral upper and lower extremities.   Shoulder shrug is equal and symmetric.  There is no pronator drift. Deep tendon reflexes: Deep tendon  reflexes are 2-2+/4 at the bilateral biceps, triceps, brachioradialis, patella and 1/4 at the bilateral achilles. Plantar responses are downgoing bilaterally.  Movement examination: Tone: There is normal tone in the bilateral upper extremities.  The tone in the lower extremities is normal.  Abnormal movements: none Coordination:  There is no decremation with RAM's, with any form of RAMS, including alternating supination and pronation of the forearm, hand opening and closing, finger taps, heel taps and toe taps. Gait and Station: The patient has no difficulty arising out of a deep-seated chair without the use of the hands. The patient's stride length is normal with normal stride length.  The patient has a neg pull test.    He actually puts his foot up one at a time on the chair while standing to tie his shoes and is able to balance.  Lab Results  Component Value Date   [id number] 05/04/2015    No results found for: RPR  Lab Results  Component Value Date   TSH 1.071 04/10/2015     ASSESSMENT/PLAN:  1.  Intermittent apraxia of gait  -I saw no evidence of idiopathic Parkinsons disease today.    Pts wife reports that he has had intermittent problems walking for years when he gets sick and I suspect that this is apraxia of gait that this is probably from dementia.   He is off of levodopa and doing well off of it.  2.  Subcortical dementia  -He saw Dr. Richrd Sox for testing on May 9 and follow-up on May 19.  Her reports indicate that the patient had a moderate subcortical dementia (not Alzheimer's dementia).  She recommended that he discontinue driving.  She told his wife to remove the guns from the home, which was done.  She also recommended that he follow up with Dr. Casimiro Needle and perhaps consider changing his clonazepam to something  else.  -Talked to him about importance of safe, CV exercise  -Talked to him about mental exercises  -Agree with Dr. Thedore Mins recommendations and I completely  support those.  He is not driving.  Wife indicates that he is on SNF list.  Wife works during the day, and he is alone for a while.  Agree with looking at SNF.    -start aricept 5 mg and increase to 10 mg daily.  Risks, benefits, side effects and alternative therapies were discussed.  The opportunity to ask questions was given and they were answered to the best of my ability.  The patient expressed understanding and willingness to follow the outlined treatment protocols.  3.  Follow up is anticipated in the next few months, sooner should new neurologic issues arise.

## 2015-07-11 NOTE — Patient Outreach (Signed)
Transition of care call: Talked with Mrs. Ricky Conley, caregiver to Ricky Conley. She reports pt is without belly pain, eating more, getting up and around, went out to his shop yesterday.He takes care of his own colostomy care. He is sometimes unhappy with not being able to find his keys to drive which his MD has advised he should not. Ricky Conley has to hide the keys.   I advised I would be calling her weekly over the next month and gave her my number in case she has any questions or concerns she can call me. She has told me they do not need a nurse to come out at this time.  Deloria Lair [facility] [phone]

## 2015-07-11 NOTE — Patient Instructions (Addendum)
You need to do some safe cardiovascular exercise.   You need to try and learn something new (new activity) Keep interacting socially Start aricept (donepezil) 5 mg daily and you will take that for one month and then we will increase to 10 mg daily.   I agree with Dr. Richrd Sox that you should not be driving. Your diagnosis is subcortical dementia

## 2015-07-13 DIAGNOSIS — Z933 Colostomy status: Secondary | ICD-10-CM | POA: Diagnosis not present

## 2015-07-17 ENCOUNTER — Other Ambulatory Visit (INDEPENDENT_AMBULATORY_CARE_PROVIDER_SITE_OTHER): Payer: PPO

## 2015-07-17 DIAGNOSIS — F0280 Dementia in other diseases classified elsewhere without behavioral disturbance: Secondary | ICD-10-CM | POA: Diagnosis not present

## 2015-07-17 DIAGNOSIS — K8050 Calculus of bile duct without cholangitis or cholecystitis without obstruction: Secondary | ICD-10-CM | POA: Diagnosis not present

## 2015-07-17 DIAGNOSIS — K838 Other specified diseases of biliary tract: Secondary | ICD-10-CM | POA: Diagnosis not present

## 2015-07-17 DIAGNOSIS — R7989 Other specified abnormal findings of blood chemistry: Secondary | ICD-10-CM

## 2015-07-17 DIAGNOSIS — R945 Abnormal results of liver function studies: Principal | ICD-10-CM

## 2015-07-17 LAB — HEPATIC FUNCTION PANEL
ALK PHOS: 222 U/L — AB (ref 39–117)
ALT: 16 U/L (ref 0–53)
AST: 23 U/L (ref 0–37)
Albumin: 3.1 g/dL — ABNORMAL LOW (ref 3.5–5.2)
BILIRUBIN DIRECT: 0.5 mg/dL — AB (ref 0.0–0.3)
BILIRUBIN TOTAL: 1.1 mg/dL (ref 0.2–1.2)
Total Protein: 7.1 g/dL (ref 6.0–8.3)

## 2015-07-17 LAB — CBC WITH DIFFERENTIAL/PLATELET
BASOS ABS: 0.1 10*3/uL (ref 0.0–0.1)
BASOS PCT: 1.2 % (ref 0.0–3.0)
EOS ABS: 0.1 10*3/uL (ref 0.0–0.7)
Eosinophils Relative: 2.5 % (ref 0.0–5.0)
HCT: 33.8 % — ABNORMAL LOW (ref 39.0–52.0)
HEMOGLOBIN: 11.3 g/dL — AB (ref 13.0–17.0)
Lymphocytes Relative: 36.9 % (ref 12.0–46.0)
Lymphs Abs: 1.5 10*3/uL (ref 0.7–4.0)
MCHC: 33.3 g/dL (ref 30.0–36.0)
MCV: 90.8 fl (ref 78.0–100.0)
MONO ABS: 0.5 10*3/uL (ref 0.1–1.0)
Monocytes Relative: 12.2 % — ABNORMAL HIGH (ref 3.0–12.0)
Neutro Abs: 1.9 10*3/uL (ref 1.4–7.7)
Neutrophils Relative %: 47.2 % (ref 43.0–77.0)
Platelets: 204 10*3/uL (ref 150.0–400.0)
RBC: 3.73 Mil/uL — AB (ref 4.22–5.81)
RDW: 17.5 % — AB (ref 11.5–15.5)
WBC: 4.1 10*3/uL (ref 4.0–10.5)

## 2015-07-18 ENCOUNTER — Other Ambulatory Visit: Payer: Self-pay

## 2015-07-18 ENCOUNTER — Other Ambulatory Visit: Payer: Self-pay | Admitting: *Deleted

## 2015-07-18 DIAGNOSIS — R945 Abnormal results of liver function studies: Principal | ICD-10-CM

## 2015-07-18 DIAGNOSIS — R7989 Other specified abnormal findings of blood chemistry: Secondary | ICD-10-CM

## 2015-07-18 NOTE — Patient Outreach (Signed)
Transition of care call #2.   There was no answer and I left a message and requested a return call.  Deloria Lair [facility] [place] [phone]

## 2015-07-19 ENCOUNTER — Encounter: Payer: Self-pay | Admitting: Gastroenterology

## 2015-07-19 ENCOUNTER — Ambulatory Visit (INDEPENDENT_AMBULATORY_CARE_PROVIDER_SITE_OTHER): Payer: PPO | Admitting: Gastroenterology

## 2015-07-19 VITALS — BP 120/60 | HR 84 | Ht 67.75 in | Wt 159.4 lb

## 2015-07-19 DIAGNOSIS — R7989 Other specified abnormal findings of blood chemistry: Secondary | ICD-10-CM | POA: Diagnosis not present

## 2015-07-19 DIAGNOSIS — R945 Abnormal results of liver function studies: Secondary | ICD-10-CM

## 2015-07-19 DIAGNOSIS — K8050 Calculus of bile duct without cholangitis or cholecystitis without obstruction: Secondary | ICD-10-CM

## 2015-07-19 NOTE — Progress Notes (Signed)
    History of Present Illness: This is a 70 year old male returning after ERCP sphincterotomy and CBD stone/sludge extraction. See ERCP results below. MRCP and abdominal ultrasound have not revealed gallstones. He was hospitalized overnight following his ERCP for chills, weakness and confusion. Blood culture grew E coli. He may have had an anesthesia side effect as well. He completed 10 days of Cipro. His LFTs have improved but have not yet normalized. LFTs from 07/17/2015 show an alkaline phosphatase of 222 and a direct bilirubin of 0.5, otherwise normal. He feels well. No GI complaints. He is accompanied by his wife.    ERCP  - The common bile duct was dilated. - Choledocholithiasis was found in the distal CBD. Complete removal was accomplished by biliary sphincterotomy and balloon extraction. - A biliary sphincterotomy was performed. - The biliary tree was swept and sludge was found. - Small duodenal diveritculum  Current Medications, Allergies, Past Medical History, Past Surgical History, Family History and Social History were reviewed in [employer] record.  Physical Exam: General: Well developed, well nourished, no acute distress Head: Normocephalic and atraumatic Eyes:  sclerae anicteric, EOMI Ears: Normal auditory acuity Mouth: No deformity or lesions Lungs: Clear throughout to auscultation Heart: Regular rate and rhythm; no murmurs, rubs or bruits Abdomen: Soft, non tender and non distended. No masses, hepatosplenomegaly or hernias noted. Normal Bowel sounds Musculoskeletal: Symmetrical with no gross deformities  Pulses:  Normal pulses noted Extremities: No clubbing, cyanosis, edema or deformities noted Neurological: Alert oriented x 4, grossly nonfocal Psychological:  Alert and cooperative. Normal mood and affect  Assessment and Recommendations:  1. Choledocholithiasis status post ERCP, sphincterotomy and stone/sludge extraction. Bacteremia post ERCP  with E coli treated with Cipro for 10 days. LFTs improved. No evidence of cholelithiasis on ultrasound and MRCP. Plan for repeat LFTs in 6 weeks. Return to his primary physician for ongoing care.  I spent 15 minutes of face-to-face time with the patient. Greater than 50% of the time was spent counseling and coordinating care.

## 2015-07-19 NOTE — Patient Instructions (Signed)
Return in 6 weeks to the lab to have your lab work done (Liver function test).

## 2015-07-25 DIAGNOSIS — R3989 Other symptoms and signs involving the genitourinary system: Secondary | ICD-10-CM | POA: Diagnosis not present

## 2015-07-27 ENCOUNTER — Other Ambulatory Visit: Payer: Self-pay | Admitting: *Deleted

## 2015-07-27 NOTE — Patient Outreach (Signed)
Transition of care call #2, 2nd attempt. I called and left a message to return my call.  Deloria Lair [facility] [place] [phone]

## 2015-07-31 ENCOUNTER — Encounter: Payer: Self-pay | Admitting: *Deleted

## 2015-07-31 ENCOUNTER — Other Ambulatory Visit: Payer: Self-pay | Admitting: *Deleted

## 2015-07-31 NOTE — Patient Outreach (Signed)
Attempted Transition of care call, but there was no answer. I left a message and requested a return call.  Deloria Lair [facility] [place] [phone]

## 2015-07-31 NOTE — Patient Outreach (Signed)
Transition of care call. Pt's wife returned my call. She reports he is doing remarkably better since they discontinued his cholesterol medication and added Aripcept. He is not falling any longer. 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 He no longer complains of abdominal pain. He is not driving per MD orders.   Mrs. Fahrenkrug states she still has not received the introductory packet of information. I will request this to be sent out again. I Will be calling her again next Monday.  Deloria Lair [facility] [phone]

## 2015-08-07 ENCOUNTER — Encounter: Payer: Self-pay | Admitting: *Deleted

## 2015-08-07 ENCOUNTER — Other Ambulatory Visit: Payer: Self-pay | Admitting: *Deleted

## 2015-08-07 NOTE — Patient Outreach (Signed)
Final transition of care call. Mrs. Garde reports Mr. Kerwin is doing very well. He is pain free and has a great appetite. They are planning on going out to eat tomorrow night with Mr. Ancheta brother and wife. He does not have any follow up appointments for quite awhile. I reminded Mrs. Reischman that I am available for consultation if any problems arise. I have advised her that I am closing the case today. Pt did not get the second page of the consent form and I will get that signed tomorrow evening.  Deloria Lair [facility] [place] [place] [phone]

## 2015-08-21 DIAGNOSIS — F3342 Major depressive disorder, recurrent, in full remission: Secondary | ICD-10-CM | POA: Diagnosis not present

## 2015-08-30 ENCOUNTER — Other Ambulatory Visit: Payer: Self-pay

## 2015-08-30 ENCOUNTER — Other Ambulatory Visit (INDEPENDENT_AMBULATORY_CARE_PROVIDER_SITE_OTHER): Payer: PPO

## 2015-08-30 DIAGNOSIS — R945 Abnormal results of liver function studies: Principal | ICD-10-CM

## 2015-08-30 DIAGNOSIS — R7989 Other specified abnormal findings of blood chemistry: Secondary | ICD-10-CM

## 2015-08-30 DIAGNOSIS — Z79899 Other long term (current) drug therapy: Secondary | ICD-10-CM | POA: Diagnosis not present

## 2015-08-30 LAB — HEPATIC FUNCTION PANEL
ALK PHOS: 147 U/L — AB (ref 39–117)
ALT: 29 U/L (ref 0–53)
AST: 36 U/L (ref 0–37)
Albumin: 4 g/dL (ref 3.5–5.2)
BILIRUBIN DIRECT: 0.3 mg/dL (ref 0.0–0.3)
BILIRUBIN TOTAL: 0.9 mg/dL (ref 0.2–1.2)
Total Protein: 7.9 g/dL (ref 6.0–8.3)

## 2015-09-15 ENCOUNTER — Observation Stay
Admission: EM | Admit: 2015-09-15 | Discharge: 2015-09-16 | Disposition: A | Discharge status: Home / Self Care | Payer: Medicare Other | Attending: Emergency Medicine | Admitting: Emergency Medicine

## 2015-09-15 ENCOUNTER — Emergency Department: Payer: Medicare Other

## 2015-09-15 ENCOUNTER — Observation Stay: Payer: Medicare Other | Admitting: Emergency Medicine

## 2015-09-15 DIAGNOSIS — R110 Nausea: Secondary | ICD-10-CM | POA: Insufficient documentation

## 2015-09-15 DIAGNOSIS — K219 Gastro-esophageal reflux disease without esophagitis: Secondary | ICD-10-CM

## 2015-09-15 DIAGNOSIS — Z9049 Acquired absence of other specified parts of digestive tract: Secondary | ICD-10-CM | POA: Insufficient documentation

## 2015-09-15 DIAGNOSIS — R42 Dizziness and giddiness: Secondary | ICD-10-CM | POA: Insufficient documentation

## 2015-09-15 DIAGNOSIS — Z7984 Long term (current) use of oral hypoglycemic drugs: Secondary | ICD-10-CM | POA: Insufficient documentation

## 2015-09-15 DIAGNOSIS — R0789 Other chest pain: Secondary | ICD-10-CM

## 2015-09-15 DIAGNOSIS — R7989 Other specified abnormal findings of blood chemistry: Secondary | ICD-10-CM

## 2015-09-15 DIAGNOSIS — N179 Acute kidney failure, unspecified: Secondary | ICD-10-CM | POA: Insufficient documentation

## 2015-09-15 DIAGNOSIS — Z7982 Long term (current) use of aspirin: Secondary | ICD-10-CM | POA: Insufficient documentation

## 2015-09-15 DIAGNOSIS — E785 Hyperlipidemia, unspecified: Secondary | ICD-10-CM | POA: Insufficient documentation

## 2015-09-15 DIAGNOSIS — R079 Chest pain, unspecified: Principal | ICD-10-CM | POA: Insufficient documentation

## 2015-09-15 DIAGNOSIS — E119 Type 2 diabetes mellitus without complications: Secondary | ICD-10-CM | POA: Insufficient documentation

## 2015-09-15 DIAGNOSIS — Z794 Long term (current) use of insulin: Secondary | ICD-10-CM | POA: Insufficient documentation

## 2015-09-15 DIAGNOSIS — Z87891 Personal history of nicotine dependence: Secondary | ICD-10-CM | POA: Insufficient documentation

## 2015-09-15 LAB — I-STAT CHEM 8 CARTRIDGE
Anion Gap I-Stat: 15 (ref 7–16)
BUN I-Stat: 22 mg/dL — ABNORMAL HIGH (ref 6–20)
Calcium Ionized I-Stat: 4.6 mg/dL (ref 4.35–5.10)
Chloride I-Stat: 101 mMol/L (ref 98–112)
Creatinine I-Stat: 1.2 mg/dL (ref 0.90–1.30)
EGFR: 60 mL/min/{1.73_m2} (ref 60–150)
Glucose I-Stat: 234 mg/dL — ABNORMAL HIGH (ref 70–99)
Hematocrit I-Stat: 35 % — ABNORMAL LOW (ref 39.0–52.5)
Hemoglobin I-Stat: 11.9 gm/dL — ABNORMAL LOW (ref 13.0–17.5)
Potassium I-Stat: 4.8 mMol/L (ref 3.5–5.3)
Sodium I-Stat: 138 mMol/L (ref 135–145)
TCO2 I-Stat: 28 mMol/L (ref 24–29)

## 2015-09-15 LAB — ECG 12-LEAD
P Wave Axis: -7 deg
P-R Interval: 150 ms
Patient Age: 70 years
Q-T Interval(Corrected): 398 ms
Q-T Interval: 382 ms
QRS Axis: -27 deg
QRS Duration: 86 ms
T Axis: 37 years
Ventricular Rate: 65 //min

## 2015-09-15 LAB — CBC AND DIFFERENTIAL
Basophils %: 0.7 % (ref 0.0–3.0)
Basophils Absolute: 0 10*3/uL (ref 0.0–0.3)
Eosinophils %: 5.7 % (ref 0.0–7.0)
Eosinophils Absolute: 0.3 10*3/uL (ref 0.0–0.8)
Hematocrit: 38.9 % — ABNORMAL LOW (ref 39.0–52.5)
Hemoglobin: 12.1 gm/dL — ABNORMAL LOW (ref 13.0–17.5)
Lymphocytes Absolute: 1.6 10*3/uL (ref 0.6–5.1)
Lymphocytes: 25.6 % (ref 15.0–46.0)
MCH: 26 pg — ABNORMAL LOW (ref 28–35)
MCHC: 31 gm/dL — ABNORMAL LOW (ref 32–36)
MCV: 83 fL (ref 80–100)
MPV: 6.6 fL (ref 6.0–10.0)
Monocytes Absolute: 0.6 10*3/uL (ref 0.1–1.7)
Monocytes: 9.1 % (ref 3.0–15.0)
Neutrophils %: 58.8 % (ref 42.0–78.0)
Neutrophils Absolute: 3.6 10*3/uL (ref 1.7–8.6)
PLT CT: 312 10*3/uL (ref 130–440)
RBC: 4.66 10*6/uL (ref 4.00–5.70)
RDW: 13.7 % (ref 11.0–14.0)
WBC: 6.1 10*3/uL (ref 4.0–11.0)

## 2015-09-15 LAB — LIPID PANEL
Cholesterol: 123 mg/dL (ref 75–199)
Coronary Heart Disease Risk: 2.41
HDL: 51 mg/dL (ref 40–55)
LDL Calculated: 58 mg/dL
Triglycerides: 71 mg/dL (ref 10–150)
VLDL: 14 (ref 0–40)

## 2015-09-15 LAB — TROPONIN I: Troponin I: 0 ng/mL (ref 0.00–0.02)

## 2015-09-15 LAB — VH DEXTROSE STICK GLUCOSE: Glucose POCT: 208 mg/dL — ABNORMAL HIGH (ref 70–99)

## 2015-09-15 LAB — VH I-STAT CHEM 8 NOTIFICATION

## 2015-09-15 LAB — I-STAT TROPONIN: Troponin I I-Stat: <0.02 ng/mL (ref 0.00–0.02)

## 2015-09-15 LAB — VH I-STAT TROPONIN NOTIFICATION

## 2015-09-15 MED ORDER — LISINOPRIL 5 MG PO TABS
2.5000 mg | ORAL_TABLET | Freq: Every evening | ORAL | Status: DC
Start: 2015-09-16 — End: 2015-09-16

## 2015-09-15 MED ORDER — IBUPROFEN 400 MG PO TABS
400.0000 mg | ORAL_TABLET | Freq: Three times a day (TID) | ORAL | Status: DC | PRN
Start: 2015-09-15 — End: 2015-09-16

## 2015-09-15 MED ORDER — INSULIN DETEMIR 100 UNIT/ML SC SOPN
8.0000 [IU] | PEN_INJECTOR | Freq: Every evening | SUBCUTANEOUS | Status: DC
Start: 2015-09-15 — End: 2015-09-16
  Administered 2015-09-15: 8 [IU] via SUBCUTANEOUS
  Filled 2015-09-15: qty 3

## 2015-09-15 MED ORDER — ACETAMINOPHEN 650 MG RE SUPP
650.0000 mg | RECTAL | Status: DC | PRN
Start: 2015-09-15 — End: 2015-09-16

## 2015-09-15 MED ORDER — ONDANSETRON HCL 4 MG/2ML IJ SOLN
4.0000 mg | Freq: Three times a day (TID) | INTRAMUSCULAR | Status: DC | PRN
Start: 2015-09-15 — End: 2015-09-16

## 2015-09-15 MED ORDER — ACETAMINOPHEN 160 MG/5ML PO SOLN
650.0000 mg | ORAL | Status: DC | PRN
Start: 2015-09-15 — End: 2015-09-16

## 2015-09-15 MED ORDER — NITROGLYCERIN 0.4 MG SL SUBL
0.4000 mg | SUBLINGUAL_TABLET | SUBLINGUAL | Status: DC | PRN
Start: 2015-09-15 — End: 2015-09-16

## 2015-09-15 MED ORDER — ACETAMINOPHEN 325 MG PO TABS
650.0000 mg | ORAL_TABLET | ORAL | Status: DC | PRN
Start: 2015-09-15 — End: 2015-09-16

## 2015-09-15 MED ORDER — ASPIRIN 81 MG PO CHEW
81.0000 mg | CHEWABLE_TABLET | Freq: Every day | ORAL | Status: DC
Start: 2015-09-15 — End: 2015-09-16
  Administered 2015-09-15 – 2015-09-16 (×2): 81 mg via ORAL
  Filled 2015-09-15: qty 1

## 2015-09-15 MED ORDER — HYDROCODONE-ACETAMINOPHEN 5-325 MG PO TABS
1.0000 | ORAL_TABLET | ORAL | Status: DC | PRN
Start: 2015-09-15 — End: 2015-09-16

## 2015-09-15 MED ORDER — SODIUM CHLORIDE 0.9 % IJ SOLN
0.4000 mg | INTRAMUSCULAR | Status: DC | PRN
Start: 2015-09-15 — End: 2015-09-16

## 2015-09-15 MED ORDER — ASPIRIN 81 MG PO CHEW
81.0000 mg | CHEWABLE_TABLET | Freq: Every day | ORAL | Status: DC
Start: 2015-09-16 — End: 2015-09-15

## 2015-09-15 MED ORDER — FAMOTIDINE 20 MG PO TABS
20.0000 mg | ORAL_TABLET | Freq: Two times a day (BID) | ORAL | Status: DC
Start: 2015-09-15 — End: 2015-09-16
  Administered 2015-09-16: 20 mg via ORAL
  Filled 2015-09-15: qty 1

## 2015-09-15 MED ORDER — ONDANSETRON 4 MG PO TBDP
4.0000 mg | ORAL_TABLET | Freq: Three times a day (TID) | ORAL | Status: DC | PRN
Start: 2015-09-15 — End: 2015-09-16

## 2015-09-15 MED ORDER — METFORMIN HCL 500 MG PO TABS
1000.0000 mg | ORAL_TABLET | Freq: Two times a day (BID) | ORAL | Status: DC
Start: 2015-09-16 — End: 2015-09-16
  Filled 2015-09-15 (×2): qty 2

## 2015-09-15 NOTE — Plan of Care (Signed)
Problem: Chest Pain  Goal: Vital signs and cardiac rhythm stable  Outcome: Progressing  NSR

## 2015-09-15 NOTE — ED Provider Notes (Signed)
[facility]  EMERGENCY DEPARTMENT  History and Physical Exam     Patient Name: Henry Hines, Henry Hines  Encounter Date:  09/15/2015  Attending Physician: Caralyn Guile. Rachel Moulds, M.D.  Room:  C15/C15-A  Patient DOB:  March 13, 1945  Age: 70 y.o. male  MRN:  [id number]  PCP: Anise Salvo, MD      Diagnosis/Disposition:  MDM:     Final Impression  1. Acute chest pain    2. Severe vertigo        Disposition  ED Disposition     Admit           Follow up  No follow-up provider specified.    Prescriptions  New Prescriptions    No medications on file         The patient admitted to the observation service for further evaluation and treatment of the chest pain. He is remained hemodynamically stable throughout the entire ED course.          The results of diagnostic studies have been reviewed by myself. Available past medical, family, social, and surgical histories have been reviewed by myself. The clinical impression and plan have been discussed with the patient and/or the patient's family. All questions have been answered.      History of Presenting Illness:     Chief complaint: Chest Pain      Henry Hines is a 70 y.o. male presenting with chest pain. The patient states that yesterday evening he had sudden onset dizziness. Patient states he's had vertiginous-like symptoms. He states that the symptoms would be exacerbated if he lied on his right or left side.  Patient also states he had episodes of chest tightness. He states that this lasted for approximately half hour to 45 minutes. He states he's had nausea. Patient also states that when he is dizzy he felt ataxic. He states that since this morning he has been asymptomatic.        Review of Systems:  Physical Exam:     Review of Systems   Constitutional: Negative for fever, chills and fatigue.   HENT: Negative for nosebleeds, rhinorrhea and sore throat.    Eyes: Negative for pain, discharge and visual disturbance.   Respiratory: Negative for cough and shortness of  breath.    Cardiovascular: Positive for chest pain. Negative for palpitations.   Gastrointestinal: Positive for nausea. Negative for vomiting, abdominal pain and diarrhea.   Genitourinary: Negative for dysuria, frequency and hematuria.   Musculoskeletal: Negative for myalgias, joint swelling and arthralgias.   Skin: Negative for rash.   Neurological: Positive for dizziness. Negative for syncope and headaches.   Hematological: Negative for adenopathy.   Psychiatric/Behavioral: Negative for dysphoric mood. The patient is not nervous/anxious.      Blood pressure 111/65, pulse 67, temperature 98.5 F (36.9 C), temperature source Oral, resp. rate 14, height 1.829 m, weight 78.2 kg, SpO2 100 %.    Physical Exam   Constitutional: He is oriented to person, place, and time. He appears well-developed and well-nourished.   HENT:   Head: Normocephalic and atraumatic.   Eyes: EOM are normal. Pupils are equal, round, and reactive to light.   Neck: Neck supple. No JVD present. No tracheal deviation present.   Cardiovascular: Normal rate, regular rhythm and normal heart sounds.    Pulmonary/Chest: Effort normal and breath sounds normal.   Abdominal: Soft. Bowel sounds are normal.   Musculoskeletal: Normal range of motion.   Neurological: He is alert and oriented to person,  place, and time.   Skin: Skin is warm and dry.   Psychiatric: He has a normal mood and affect.   Nursing note and vitals reviewed.         Allergies & Medications:     Pt has No Known Allergies.    Current/Home Medications    ASPIRIN EC 81 MG EC TABLET    Take by mouth daily.    BLOOD GLUCOSE CALIBRATION (EMBRACE CONTROL) LOW SOLUTION        BLOOD GLUCOSE MONITORING SUPPL (EMBRACE BLOOD GLUCOSE MONITOR) DEVICE        CYCLOBENZAPRINE (FLEXERIL) 10 MG TABLET    Take 10 mg by mouth 3 (three) times daily as needed for Muscle spasms.    EMBRACE BLOOD GLUCOSE TEST TEST STRIP        GLIPIZIDE (GLUCOTROL) 10 MG TABLET        HYDROCODONE-ACETAMINOPHEN (NORCO) 5-325 MG  PER TABLET    Take 1-2 tablets by mouth every 6 (six) hours as needed for Pain.    IBUPROFEN (ADVIL,MOTRIN) 600 MG TABLET    Take 1 tablet (600 mg total) by mouth every 6 (six) hours as needed for Pain.    JANUVIA 100 MG TABLET        LANCET DEVICES (SIMPLE DIAGNOSTICS LANCING DEV) MISC        METFORMIN (GLUCOPHAGE) 500 MG TABLET        OMEPRAZOLE (PRILOSEC) 40 MG CAPSULE        ORPHENADRINE (NORFLEX) 100 MG TABLET    Take 1 tablet (100 mg total) by mouth 2 (two) times daily.    OXYCODONE-ACETAMINOPHEN (PERCOCET) 5-325 MG PER TABLET    Take 1-2 tablets by mouth every 4 (four) hours as needed.    OXYCODONE-ACETAMINOPHEN (PERCOCET) 5-325 MG PER TABLET    Take 1-2 tablets by mouth every 6 (six) hours as needed for Pain.    PHARMACIST CHOICE LANCETS MISC        PRAVASTATIN (PRAVACHOL) 40 MG TABLET    80 mg.       PREDNISONE (DELTASONE) 20 MG TABLET    Take 3 tablets (60 mg total) by mouth daily. Take 60 mg PO x 7 days, then 40 mg PO x 1 day, then 20 mg PO x 1 day    RANITIDINE (ZANTAC) 150 MG TABLET        TRAMADOL (ULTRAM) 50 MG TABLET    Take 50 mg by mouth every 6 (six) hours as needed for Pain.         Past History:     Medical: Pt has a past medical history of Diabetes mellitus; Prostate cancer; and GERD (gastroesophageal reflux disease).    Surgical: Pt  has past surgical history that includes EGD (03/23/2013); LAPAROSCOPIC, CHOLECYSTECTOMY, CHOLANGIOGRAM (03/24/2013); Appendectomy; Cholecystectomy; and Hernia repair.    Family: The family history is not on file.    Social: Pt reports that he has quit smoking. He does not have any smokeless tobacco history on file. He reports that he does not drink alcohol or use illicit drugs.        Diagnostic Results:     Radiologic Studies  Xr Chest 2 Views    09/15/2015  No acute disease in the chest. ReadingStation:[id number]      Lab Studies  Labs Reviewed   CBC AND DIFFERENTIAL - Abnormal; Notable for the following:     Hemoglobin 12.1 (*)     Hematocrit 38.9 (*)     MCH 26 (*)  MCHC 31 (*)     All other components within normal limits   I-STAT CHEM 8 CARTRIDGE - Abnormal; Notable for the following:     i-STAT Glucose 234 (*)     i-STAT BUN 22 (*)     i-STAT Hematocrit 35.0 (*)     i-STAT Hemoglobin 11.9 (*)     All other components within normal limits   VH I-STAT CHEM 8 NOTIFICATION   VH I-STAT TROPONIN NOTIFICATION   TROPONIN I   I-STAT TROPONIN         Procedure/EKG:             ATTESTATIONS     Milanni Ayub D. Rachel Moulds, M.D.             Tessie Fass, MD  09/15/15 [phone]

## 2015-09-15 NOTE — ED Notes (Signed)
Pt was sent to ER by PCP to "rule out MI from last night".  Pt reports feeling very ill and "out of his skin" throughout the night with heaviness in his chest and generalized discomfort.  Denies any NVD or fevers.  Denies any chest pain today.  Denies any SOB.

## 2015-09-15 NOTE — H&P (Signed)
[facility]  OBSERVATION UNIT  HISTORY AND PHYSICAL       Patient: Henry Hines  Admission Date: 09/15/2015    DOB: [birth year]  Age: 70 y.o.    MRN: [id number]  Sex: male    PCP: Anise Salvo, MD  Attending: Tessie Fass, MD         HISTORY OF PRESENT ILLNESS     Henry Hines is a 70 y.o. male who presented with chest pressure on and off since yesterday.  Pt states he was doing some work at his cabin in [place] yesterday and began feeling "just night right".  This feeling went on through the night; generalized fatigue, chest pressure, some dizziness and mild nausea.  He slept well but noticed mild symptoms this morning.  After describing his symptoms to his PCP he was sent to the ED for cardiac workup.  He denies active chest pain or other symptoms.     CAD risk factors:  Male age > 70 years, diabetes, hyperlipidemia.  He quit smoking many years ago.  Denies family history.  Stress test > 30 years ago per patient.      Workup in the ED including ECG, d-dimer, and initial Trop-I has been unremarkable.  The patient has agreed to transfer to the Observation Unit for completion of cardiac workup with Stress Testing in the morning.       ASSESSMENT & PLAN     Henry Hines is a 70 y.o. male admitted under OBSERVATION with chest pain.     Assessment & Plan:  Transfer to Observation Unit for cardiac work up.     Chest Pain  -Telemetry  -Serial Trop-I  -Repeat ECG PRN  -Lipid Panel  -ASA q day  -Nitrates for pain as needed  -Nuclear Stress Test  In AM  -Cardiac Consultation PRN:              PAST MEDICAL HISTORY     Code Status: Prior    Past Medical History   Diagnosis Date   . Diabetes mellitus    . Prostate cancer    . GERD (gastroesophageal reflux disease)        Past Surgical History   Procedure Laterality Date   . Egd  03/23/2013     Procedure: EGD;  Surgeon: Jayme Cloud, MD;  Location: [facility];  Service: Gastroenterology;  Laterality: N/A;   . Laparoscopic, cholecystectomy,  cholangiogram  03/24/2013     Procedure: LAPAROSCOPIC, CHOLECYSTECTOMY, CHOLANGIOGRAM;  Surgeon: Leory Plowman, MD;  Location: [facility];  Service: General;  Laterality: N/A;  LAP CHOLE    ROOM 516     . Appendectomy     . Cholecystectomy     . Hernia repair         Current/Home Medications    ASPIRIN EC 81 MG EC TABLET    Take 81 mg by mouth every evening.        INSULIN DETEMIR (LEVEMIR) 100 UNIT/ML INJECTION    Inject 8 Units into the skin nightly.    INSULIN LISPRO (HUMALOG) 100 UNIT/ML INJECTION    Inject 5 Units into the skin 2 (two) times daily before meals.5 units at lunch and dinner    LISINOPRIL (PRINIVIL,ZESTRIL) 2.5 MG TABLET    Take 2.5 mg by mouth every evening.    METFORMIN (GLUCOPHAGE) 500 MG TABLET    Take 1,000 mg by mouth 2 (two) times daily with meals.Take two tablets (1,000  mg total) twice a day        OMEPRAZOLE (PRILOSEC) 20 MG CAPSULE    Take 20 mg by mouth every evening.    PRAVASTATIN (PRAVACHOL) 80 MG TABLET    Take 80 mg by mouth every evening.    RANITIDINE (ZANTAC) 75 MG TABLET    Take 75 mg by mouth every morning.       Allergies: No Known Allergies    History reviewed. No pertinent family history.    SHx:  reports that he has quit smoking. He does not have any smokeless tobacco history on file. He reports that he does not drink alcohol or use illicit drugs.    REVIEW OF SYSTEMS     A complete 12 point review of systems was completed and was negative except as noted in the HPI.     PHYSICAL EXAM     Vital Signs: Blood pressure 103/54, pulse 65, temperature 98.5 F (36.9 C), temperature source Oral, resp. rate 15, height 1.829 m (6'), weight 78.2 kg (172 lb 6.4 oz), SpO2 99 %.    Physical Exam   Constitutional: He is oriented to person, place, and time. He appears well-developed and well-nourished. No distress.   HENT:   Head: Normocephalic and atraumatic.   Eyes: Conjunctivae are normal. Pupils are equal, round, and reactive to light.   Neck: Normal range of motion. Neck  supple. No JVD present. No tracheal deviation present.   Cardiovascular: Normal rate, regular rhythm, normal heart sounds and intact distal pulses.    Pulmonary/Chest: Effort normal and breath sounds normal. No respiratory distress. He exhibits no tenderness.   Abdominal: Soft. Bowel sounds are normal. He exhibits no distension. There is no tenderness.   Musculoskeletal: Normal range of motion. He exhibits no tenderness.   Neurological: He is alert and oriented to person, place, and time. No cranial nerve deficit.   Skin: Skin is warm and dry. No rash noted. There is erythema. There is pallor.   Pt has small swollen, red, tender, area on right forearm from bee sting few days ago   Psychiatric: He has a normal mood and affect. His behavior is normal.       LABS & IMAGING     Labs:  Results for orders placed or performed during the hospital encounter of 09/15/15   I-Stat Chem 8   Result Value Ref Range    I-STAT Notification Istat Notification    I-Stat Troponin   Result Value Ref Range    I-STAT Notification Istat Notification    CBC and differential   Result Value Ref Range    WBC 6.1 4.0 - 11.0 K/cmm    RBC 4.66 4.00 - 5.70 M/cmm    Hemoglobin 12.1 (L) 13.0 - 17.5 gm/dL    Hematocrit 91.4 (L) 39.0 - 52.5 %    MCV 83 80 - 100 fL    MCH 26 (L) 28 - 35 pg    MCHC 31 (L) 32 - 36 gm/dL    RDW 78.2 95.6 - 21.3 %    PLT CT 312 130 - 440 K/cmm    MPV 6.6 6.0 - 10.0 fL    NEUTROPHIL % 58.8 42.0 - 78.0 %    Lymphocytes 25.6 15.0 - 46.0 %    Monocytes 9.1 3.0 - 15.0 %    Eosinophils % 5.7 0.0 - 7.0 %    Basophils % 0.7 0.0 - 3.0 %    Neutrophils Absolute 3.6 1.7 - 8.6 K/cmm  Lymphocytes Absolute 1.6 0.6 - 5.1 K/cmm    Monocytes Absolute 0.6 0.1 - 1.7 K/cmm    Eosinophils Absolute 0.3 0.0 - 0.8 K/cmm    BASO Absolute 0.0 0.0 - 0.3 K/cmm   i-Stat Chem 8 CartrIDge   Result Value Ref Range    i-STAT Sodium 138 135 - 145 mMol/L    i-STAT Potassium 4.8 3.5 - 5.3 mMol/L    i-STAT Chloride 101 98 - 112 mMol/L    TCO2, ISTAT 28 24 -  29 mMol/L    Ionized Ca, ISTAT 4.60 4.35 - 5.10 mg/dL    i-STAT Glucose 604 (H) 70 - 99 mg/dL    i-STAT Creatinine 5.40 0.90 - 1.30 mg/dL    i-STAT BUN 22 (H) 6 - 20 mg/dL    Anion Gap, ISTAT 98.1 7 - 16    EGFR 60 60 - 150 mL/min/1.60m2    i-STAT Hematocrit 35.0 (L) 39.0 - 52.5 %    i-STAT Hemoglobin 11.9 (L) 13.0 - 17.5 gm/dL   i-Stat Troponin   Result Value Ref Range    Trop I, ISTAT <0.02 0.00 - 0.02 ng/mL       Imaging:  XR Chest 2 Views   Final Result   No acute disease in the chest.      ReadingStation:[id number]      NM Myocardial Perfusion Spect    (Results Pending)   Cardiac Stress Test    (Results Pending)            EMERGENCY DEPARTMENT COURSE     ED Medication Orders     None          ED documentation including nurses notes were reviewed by myself.  The case was discussed with the ED Attending.    I certify the need for admission to the Observation Unit based on the patient's history and the information above.    Jacklynn Bue, [place]   09/15/2015 8:17 PM

## 2015-09-16 ENCOUNTER — Observation Stay: Payer: Medicare Other

## 2015-09-16 LAB — CBC AND DIFFERENTIAL
Basophils %: 0.8 % (ref 0.0–3.0)
Basophils Absolute: 0 10*3/uL (ref 0.0–0.3)
Eosinophils %: 6.8 % (ref 0.0–7.0)
Eosinophils Absolute: 0.4 10*3/uL (ref 0.0–0.8)
Hematocrit: 35.3 % — ABNORMAL LOW (ref 39.0–52.5)
Hemoglobin: 11 gm/dL — ABNORMAL LOW (ref 13.0–17.5)
Lymphocytes Absolute: 2.1 10*3/uL (ref 0.6–5.1)
Lymphocytes: 35.1 % (ref 15.0–46.0)
MCH: 26 pg — ABNORMAL LOW (ref 28–35)
MCHC: 31 gm/dL — ABNORMAL LOW (ref 32–36)
MCV: 83 fL (ref 80–100)
MPV: 6.7 fL (ref 6.0–10.0)
Monocytes Absolute: 0.5 10*3/uL (ref 0.1–1.7)
Monocytes: 9.1 % (ref 3.0–15.0)
Neutrophils %: 48.3 % (ref 42.0–78.0)
Neutrophils Absolute: 2.9 10*3/uL (ref 1.7–8.6)
PLT CT: 280 10*3/uL (ref 130–440)
RBC: 4.24 10*6/uL (ref 4.00–5.70)
RDW: 13.7 % (ref 11.0–14.0)
WBC: 6 10*3/uL (ref 4.0–11.0)

## 2015-09-16 LAB — VH DEXTROSE STICK GLUCOSE
Glucose POCT: 106 mg/dL — ABNORMAL HIGH (ref 70–99)
Glucose POCT: 141 mg/dL — ABNORMAL HIGH (ref 70–99)

## 2015-09-16 LAB — TROPONIN I: Troponin I: 0 ng/mL (ref 0.00–0.02)

## 2015-09-16 LAB — HEMOGLOBIN A1C: Hgb A1C, %: 7 %

## 2015-09-16 MED ORDER — INSULIN ASPART 100 UNIT/ML SC SOPN
5.0000 [IU] | PEN_INJECTOR | Freq: Every day | SUBCUTANEOUS | Status: DC
Start: 2015-09-16 — End: 2015-09-16
  Administered 2015-09-16: 5 [IU] via SUBCUTANEOUS
  Filled 2015-09-16: qty 3

## 2015-09-16 MED ORDER — TECHNETIUM TC 99M SESTAMIBI - CARDIOLITE
10.4500 | Freq: Once | Status: AC | PRN
Start: 2015-09-16 — End: 2015-09-16
  Administered 2015-09-16: 10.45 via INTRAVENOUS

## 2015-09-16 MED ORDER — TECHNETIUM TC 99M SESTAMIBI - CARDIOLITE
30.0000 | Freq: Once | Status: AC | PRN
Start: 2015-09-16 — End: 2015-09-16
  Administered 2015-09-16: 30 via INTRAVENOUS

## 2015-09-16 MED ORDER — GLUCAGON 1 MG IJ SOLR (WRAP)
1.0000 mg | INTRAMUSCULAR | Status: DC | PRN
Start: 2015-09-16 — End: 2015-09-16

## 2015-09-16 MED ORDER — INSULIN ASPART 100 UNIT/ML SC SOPN
5.0000 [IU] | PEN_INJECTOR | Freq: Every day | SUBCUTANEOUS | Status: DC
Start: 2015-09-16 — End: 2015-09-16

## 2015-09-16 NOTE — Progress Notes (Signed)
Written and verbal instructions given and patient verbalizes understanding. Discharged ambulatory per patient request.

## 2015-09-16 NOTE — Discharge Instructions (Signed)
Continue usual medications.     Resume Metformin tomorrow evening with dinner.     Follow up with PCP.

## 2015-09-16 NOTE — Progress Notes (Signed)
Cardiolite Stress test     Indication: Cp     1. Baseline EKG; sinus brady  2. Pt exercised 6 min per BRUCE protocol, peak HR 176 bpm, 119% PMHR, 7.0 mets  3. Appropriate hemodynamic response  4. Symptom negative, no CP   5. Electrically abnl with ST Depression noted INF/LAT  No sustained arrhythmias    Images pending    Henry Hines PAC

## 2015-09-16 NOTE — Plan of Care (Signed)
Problem: Chest Pain  Goal: Cardiac pain management  Outcome: Progressing   Collaborate with interdisciplinarily team and initiate pain interventions as ordered. Reassess patient's level 30-60 minutes after pain management intervention. Pain goal 6 or less.Assess for pain and medicate when needed

## 2015-09-16 NOTE — UM Notes (Signed)
[facility] Utilization Management Review Sheet    NAME: Henry Hines  MR#: [id number]    CSN#: [id number]    ROOM: 2515/2515-A AGE: 70 y.o.    ADMIT DATE AND TIME: 09/15/2015  5:37 PM      PATIENT CLASS: Observation status    ATTENDING PHYSICIAN: No att. providers found  PAYOR:Payor: MEDICARE / Plan: MEDICARE PART A AND B / Product Type: *No Product type* /       AUTH #:     DIAGNOSIS:     ICD-10-CM    1. Acute chest pain R07.9    2. Severe vertigo R42        HISTORY:   Past Medical History   Diagnosis Date   . Diabetes mellitus    . Prostate cancer    . GERD (gastroesophageal reflux disease)        DATE OF REVIEW: 09/16/2015    VITALS: BP 90/63 mmHg  Pulse 70  Temp(Src) 97.7 F (36.5 C) (Oral)  Resp 16  Ht 1.829 m (6')  Wt 78.2 kg (172 lb 6.4 oz)  BMI 23.38 kg/m2  SpO2 99%        UR NOTE:  ER admit  Observation status  OBS unit    Per MD note:  70 y.o. male who presented with chest pressure on and off since yesterday. Pt states he was doing some work at his cabin in [place] yesterday and began feeling "just night right". This feeling went on through the night; generalized fatigue, chest pressure, some dizziness and mild nausea. He slept well but noticed mild symptoms this morning. After describing his symptoms to his PCP he was sent to the ED for cardiac workup. He denies active chest pain or other symptoms.     CAD risk factors: Male age > 70 years, diabetes, hyperlipidemia. He quit smoking many years ago. Denies family history. Stress test > 30 years ago per patient.     Workup in the ED including ECG, d-dimer, and initial Trop-I has been unremarkable. The patient has agreed to transfer to the Observation Unit for completion of cardiac workup with Stress Testing in the morning.        Patient was transferred observation and monitored on telemetry. He had normal cardiac enzymes. He had a good lipid panel. His A1c was 7.0. He had a nuclear stress test read as low risk no chest pain, no ischemic a  perfusion changes normal LV function of 60 defect diaphragmatic attenuation. The patient's symptoms were very atypical at however he was encouraged to follow-up with his primary care and to definitely seek expert opinion if he were to have symptoms again. Patient states he will seek follow-up care.            OBS order  OBS unit  OBS under [id number]  Patient has already discharged

## 2015-09-16 NOTE — Discharge Summary (Signed)
[facility]  OBSERVATION UNIT  DISCHARGE SUMMARY      Patient: Henry Hines  Admission Date: 09/15/2015  5:37 PM   DOB: 1945-10-31  Discharge Date: 09/16/2015 1:58 PM   MRN: [id number]  Primary Care: Anise Salvo, MD         Presentation     Henry Hines is a 70 y.o. male who presented with chest pressure on and off since yesterday. Pt states he was doing some work at his cabin in [place] yesterday and began feeling "just night right". This feeling went on through the night; generalized fatigue, chest pressure, some dizziness and mild nausea. He slept well but noticed mild symptoms this morning. After describing his symptoms to his PCP he was sent to the ED for cardiac workup. He denies active chest pain or other symptoms.     CAD risk factors: Male age > 70 years, diabetes, hyperlipidemia. He quit smoking many years ago. Denies family history. Stress test > 30 years ago per patient.     Workup in the ED including ECG, d-dimer, and initial Trop-I has been unremarkable. The patient has agreed to transfer to the Observation Unit for completion of cardiac workup with Stress Testing in the morning.     Patient was admitted to the Observation Unit with a diagnosis of   Patient Active Problem List   Diagnosis   . Diabetes mellitus type 2 in nonobese   . Common bile duct dilatation   . Colic, biliary   . Biliary colic   . AKI (acute kidney injury)   . Chest pain              Hospital Course     Filed Vitals:    09/16/15 1038   BP: 116/57   Pulse: 68   Temp: 97.7 F (36.5 C)   Resp: 16   SpO2: 99%     Patient was transferred observation and monitored on telemetry. He had normal cardiac enzymes. He had a good lipid panel. His A1c was 7.0. He had a nuclear stress test read as low risk no chest pain, no ischemic a perfusion changes normal LV function of 60 defect diaphragmatic attenuation. The patient's symptoms were very atypical at however he was encouraged to follow-up with his  primary care and to definitely seek expert opinion if he were to have symptoms again. Patient states he will seek follow-up care.         Ancillary Studies     Labs:  Results for orders placed or performed during the hospital encounter of 09/15/15   I-Stat Chem 8   Result Value Ref Range    I-STAT Notification Istat Notification    I-Stat Troponin   Result Value Ref Range    I-STAT Notification Istat Notification    CBC and differential   Result Value Ref Range    WBC 6.1 4.0 - 11.0 K/cmm    RBC 4.66 4.00 - 5.70 M/cmm    Hemoglobin 12.1 (L) 13.0 - 17.5 gm/dL    Hematocrit 91.4 (L) 39.0 - 52.5 %    MCV 83 80 - 100 fL    MCH 26 (L) 28 - 35 pg    MCHC 31 (L) 32 - 36 gm/dL    RDW 78.2 95.6 - 21.3 %    PLT CT 312 130 - 440 K/cmm    MPV 6.6 6.0 - 10.0 fL    NEUTROPHIL % 58.8 42.0 - 78.0 %  Lymphocytes 25.6 15.0 - 46.0 %    Monocytes 9.1 3.0 - 15.0 %    Eosinophils % 5.7 0.0 - 7.0 %    Basophils % 0.7 0.0 - 3.0 %    Neutrophils Absolute 3.6 1.7 - 8.6 K/cmm    Lymphocytes Absolute 1.6 0.6 - 5.1 K/cmm    Monocytes Absolute 0.6 0.1 - 1.7 K/cmm    Eosinophils Absolute 0.3 0.0 - 0.8 K/cmm    BASO Absolute 0.0 0.0 - 0.3 K/cmm   Troponin I   Result Value Ref Range    Troponin I 0.00 0.00 - 0.02 ng/mL   Lipid panel (in am)   Result Value Ref Range    Cholesterol 123 75 - 199 mg/dL    Triglycerides 71 10 - 150 mg/dL    HDL 51 40 - 55 mg/dL    LDL Calculated 58 mg/dL    Coronary Heart Disease Risk 2.41     VLDL 14 0 - 40   Dextrose Stick Glucose   Result Value Ref Range    Glucose, POCT 208 (H) 70 - 99 mg/dL   Troponin I   Result Value Ref Range    Troponin I 0.00 0.00 - 0.02 ng/mL   CBC with differential   Result Value Ref Range    WBC 6.0 4.0 - 11.0 K/cmm    RBC 4.24 4.00 - 5.70 M/cmm    Hemoglobin 11.0 (L) 13.0 - 17.5 gm/dL    Hematocrit 08.6 (L) 39.0 - 52.5 %    MCV 83 80 - 100 fL    MCH 26 (L) 28 - 35 pg    MCHC 31 (L) 32 - 36 gm/dL    RDW 57.8 46.9 - 62.9 %    PLT CT 280 130 - 440 K/cmm    MPV 6.7 6.0 - 10.0 fL    NEUTROPHIL  % 48.3 42.0 - 78.0 %    Lymphocytes 35.1 15.0 - 46.0 %    Monocytes 9.1 3.0 - 15.0 %    Eosinophils % 6.8 0.0 - 7.0 %    Basophils % 0.8 0.0 - 3.0 %    Neutrophils Absolute 2.9 1.7 - 8.6 K/cmm    Lymphocytes Absolute 2.1 0.6 - 5.1 K/cmm    Monocytes Absolute 0.5 0.1 - 1.7 K/cmm    Eosinophils Absolute 0.4 0.0 - 0.8 K/cmm    BASO Absolute 0.0 0.0 - 0.3 K/cmm   Hemoglobin A1C   Result Value Ref Range    Hgb A1C, % 7.0 %   Dextrose Stick Glucose   Result Value Ref Range    Glucose, POCT 106 (H) 70 - 99 mg/dL   Dextrose Stick Glucose   Result Value Ref Range    Glucose, POCT 141 (H) 70 - 99 mg/dL   i-Stat Chem 8 CartrIDge   Result Value Ref Range    i-STAT Sodium 138 135 - 145 mMol/L    i-STAT Potassium 4.8 3.5 - 5.3 mMol/L    i-STAT Chloride 101 98 - 112 mMol/L    TCO2, ISTAT 28 24 - 29 mMol/L    Ionized Ca, ISTAT 4.60 4.35 - 5.10 mg/dL    i-STAT Glucose 528 (H) 70 - 99 mg/dL    i-STAT Creatinine 4.13 0.90 - 1.30 mg/dL    i-STAT BUN 22 (H) 6 - 20 mg/dL    Anion Gap, ISTAT 24.4 7 - 16    EGFR 60 60 - 150 mL/min/1.20m2    i-STAT Hematocrit 35.0 (L) 39.0 -  52.5 %    i-STAT Hemoglobin 11.9 (L) 13.0 - 17.5 gm/dL   i-Stat Troponin   Result Value Ref Range    Trop I, ISTAT <0.02 0.00 - 0.02 ng/mL       Radiology:  NM Myocardial Perfusion Spect   Final Result      XR Chest 2 Views   Final Result   No acute disease in the chest.      ReadingStation:[id number]      Cardiac Stress Test    (Results Pending)       Stress Test Results:          Disposition     Diagnosis:   Patient Active Problem List    Diagnosis Date Noted   . Chest pain 09/15/2015   . Diabetes mellitus type 2 in nonobese 03/22/2013   . Common bile duct dilatation 03/22/2013   . Colic, biliary 03/22/2013   . Biliary colic 03/22/2013   . AKI (acute kidney injury) 03/22/2013       Past Medical History   Diagnosis Date   . Diabetes mellitus    . Prostate cancer    . GERD (gastroesophageal reflux disease)        Discharge to: Home    Condition: Stable    Continue usual  medications.     Resume Metformin tomorrow evening with dinner.     Follow up with PCP.     Medication(s):  Current Discharge Medication List      CONTINUE these medications which have NOT CHANGED    Details   aspirin EC 81 MG EC tablet Take 81 mg by mouth every evening.          insulin detemir (LEVEMIR) 100 UNIT/ML injection Inject 8 Units into the skin nightly.      insulin lispro (HUMALOG) 100 UNIT/ML injection Inject 5 Units into the skin 2 (two) times daily before meals.5 units at lunch and dinner      lisinopril (PRINIVIL,ZESTRIL) 2.5 MG tablet Take 2.5 mg by mouth every evening.      metFORMIN (GLUCOPHAGE) 500 MG tablet Take 1,000 mg by mouth 2 (two) times daily with meals.Take two tablets (1,000 mg total) twice a day          omeprazole (PRILOSEC) 20 MG capsule Take 20 mg by mouth every evening.      pravastatin (PRAVACHOL) 80 MG tablet Take 80 mg by mouth every evening.      raNITIdine (ZANTAC) 75 MG tablet Take 75 mg by mouth every morning.             Current Discharge Medication List          Follow Up:  Follow-up Information     Follow up with Anise Salvo, MD In 1 week.    Specialty:  Internal Medicine    Contact information:    [address]  [place] [address]  [phone]

## 2015-09-26 DIAGNOSIS — Z933 Colostomy status: Secondary | ICD-10-CM | POA: Diagnosis not present

## 2015-10-10 NOTE — Progress Notes (Signed)
Ricky Conley was seen today in the movement disorders clinic for neurologic consultation at the request of Shirline Frees, MD.  The consultation is for the evaluation of Parkinsons disease.  The patient is seen today after his recent hospitalizations.  I have reviewed hospital records made available to me.  He is accompanied by his wife who supplements the history.    He was hospitalized from February 26 to March 1 after mental status change, which was ultimately determined due to influenza.  He returned back to the hospital on March 7 to March 9 with increasing weakness.  He ended up seeing neurology and was felt that he possibly had Parkinson's disease or apraxia of gait.  He was given a trial of low dose levodopa (6am/2pm/10pm).  When they followed up with him the following day, he was not able to articulate whether this helped or not, primarily because the patient was a poor historian.   He does state that today that his walking is 200% better and he no longer has to use the walker.  His wife states that prior to his hospitalization he couldn't sit up on the side of the bed because he would fall over but he doesn't do that.  His wife states that about 2 years ago, he would start having trouble getting up if he was exhausted or if he was sick.   He also has a history of bipolar disorder and was on Depakote and clonazepam, which were to be monitored closely as an outpatient as the patient's liver enzymes were high when he was in the hospital.   07/11/15 update:  The patient is following up today, accompanied by his wife who supplements the history.  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  I weaned him off of levodopa.  He is doing well off of this.   He had some lab work.  [name] was 747.  His RPR was negative.  He saw Dr. Richrd Sox for testing on May 9 and follow-up on May 19.  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  She recommended that he discontinue driving.  She told his wife to remove the guns from the home, which was done.  She also recommended that he follow up with Dr. Casimiro Needle and perhaps consider changing his clonazepam to something else.  He is in the process of reducing it.  Pt states "my memory is much better" but his wife does wink at me.  He was on 2 mg tid but has decreased to 1 mg bid.  I did review the records since last visit.  He went to the hospital on May 18 and ended up having an ERCP and 2 stones extracted.  10/11/15 update:  The patient follows up today, accompanied by his wife who supplements the history.  I have reviewed records available since last visit.  The patient is no longer driving.  I did get him started on Aricept last visit and he has worked his way to 10 mg daily.  He is doing well with the medication.  They deny any side effects.  No diarrhea.  No abdominal pain.  Last visit, he had seen Dr. Si Raider and she had recommended that they talk to his psychiatrist about continuing to reduce his clonazepam.  He has done this and  he is all the way down to 0.5 mg daily (was at 6 mg daily at one point in time).  He is really feeling great and feels much more clear cognitively.  His wife agrees, but he wants to start driving again and his wife is not so sure.  He asks me about fasciculations in his legs.  He does not know how long they have been going on.  No weakness.  No difficulty with swallowing.  He has some back pain, but describes it as "tailbone pain" ever since his colon cancer and resection.   ALLERGIES:  No Known Allergies  CURRENT MEDICATIONS:  Outpatient Encounter Prescriptions as of 10/11/2015  Medication Sig  . amLODipine (NORVASC) 5 MG tablet Take 1 tablet (5 mg total) by mouth daily.  Marland Kitchen aspirin 81 MG EC tablet Take 81 mg by mouth every morning.   Marland Kitchen buPROPion (WELLBUTRIN XL) 300  MG 24 hr tablet Take 300 mg by mouth every morning.  . cholecalciferol (VITAMIN D) 1000 units tablet Take 1,000 Units by mouth daily.  . clonazePAM (KLONOPIN) 0.5 MG tablet Take 0.5 mg by mouth daily.  . divalproex (DEPAKOTE ER) 500 MG 24 hr tablet Take 1,500 mg by mouth every morning.  . donepezil (ARICEPT) 10 MG tablet Take 1 tablet (10 mg total) by mouth at bedtime.  . gabapentin (NEURONTIN) 300 MG capsule Take 300 mg by mouth 3 (three) times daily. Take one capsule in the morning, two capsules in the afternoon and one capsule in the evening.  Marland Kitchen Hydrocortisone (0000000 EX) Apply 1 application topically daily as needed (Applies to stoma for redness.). Reported on 07/11/2015  . [DISCONTINUED] donepezil (ARICEPT) 10 MG tablet Take 1 tablet (10 mg total) by mouth at bedtime.  . [DISCONTINUED] donepezil (ARICEPT) 5 MG tablet Take 1 tablet (5 mg total) by mouth at bedtime.  . [DISCONTINUED] Multiple Vitamin (MULTIVITAMIN) tablet Take 1 tablet by mouth daily.  . [DISCONTINUED] clonazePAM (KLONOPIN) 2 MG tablet Take 0.5 tablets (1 mg total) by mouth 3 (three) times daily. (Patient taking differently: Take 1 mg by mouth 2 (two) times daily. )   No facility-administered encounter medications on file as of 10/11/2015.     PAST MEDICAL HISTORY:   Past Medical History:  Diagnosis Date  . Anxiety   . Arthritis   . Ascending aortic aneurysm (HCC)    Dr. Cyndia Bent follows- last check 2 yrs.  . Bipolar disorder ([place])    tx Depakote  . Colon cancer [facility]) 2001   Chemotherapy, radiation- no longer seeing oncology  . Depression   . Diabetes mellitus without complication ([place])    Type II " controlled with diet"  . Diet-controlled diabetes mellitus ([place])   . Gallstones, common bile duct   . Hyperlipidemia   . Hypertension   . Impaired hearing    wears hearing aid left ear.  . Neuromuscular disorder ([place])    rectal nerve damage' oversewed" chronic pain tx. Gabapentin.  . Rectal cancer ([place])   .  Subcortical vascular dementia     PAST SURGICAL HISTORY:   Past Surgical History:  Procedure Laterality Date  . BACK SURGERY  2003  . CARPAL TUNNEL RELEASE Bilateral 2008  . COLECTOMY  2001  . COLONOSCOPY    . COLOSTOMY  2001  . ERCP N/A 06/29/2015   Procedure: ENDOSCOPIC RETROGRADE CHOLANGIOPANCREATOGRAPHY (ERCP);  Surgeon: Ladene Artist, MD;  Location: [facility];  Service: Endoscopy;  Laterality: N/A;  . FRACTURE SURGERY Left 2009   ankle  with hardware    SOCIAL HISTORY:   Social History   Social History  . Marital status: Married    Spouse name: [name]  . Number of children: 4  . Years of education: N/A   Occupational History  . retired     Curator   Social History Main Topics  . Smoking status: Former Smoker    Quit date: 05/30/1970  . Smokeless tobacco: Former Systems developer    Quit date: 05/30/1998     Comment: quit chewing tobacco approx 2012  . Alcohol use No  . Drug use: No  . Sexual activity: Not on file   Other Topics Concern  . Not on file   Social History Narrative   Married to wife, [name]   Four grown children   HOH and frequent falls   Memory deficit and confusion    FAMILY HISTORY:   Family Status  Relation Status  . Mother Deceased   alzheimers, HTN, depression  . Father Deceased   stroke  . Sister Alive   x2 - skin cancer  . Brother Alive   x1 - stroke  . Child Alive   2 sons, 2 daughters - alive and well    ROS:  A complete 10 system review of systems was obtained and was unremarkable apart from what is mentioned above.  PHYSICAL EXAMINATION:    VITALS:   Vitals:   10/11/15 1059  BP: 140/70  Pulse: 68  Weight: 158 lb (71.7 kg)  Height: 5\' 9"  (1.753 m)    GEN:  The patient appears stated age and is in NAD. HEENT:  Normocephalic, atraumatic.  The mucous membranes are moist. The superficial temporal arteries are without ropiness or tenderness. CV:  RRR Lungs:  CTAB Neck/HEME:  There are no carotid bruits  bilaterally.  Neurological examination:  Orientation:  Montreal Cognitive Assessment  05/04/2015  Visuospatial/ Executive (0/5) 2  Naming (0/3) 3  Attention: Read list of digits (0/2) 2  Attention: Read list of letters (0/1) 1  Attention: Serial 7 subtraction starting at 100 (0/3) 1  Language: Repeat phrase (0/2) 2  Language : Fluency (0/1) 0  Abstraction (0/2) 1  Delayed Recall (0/5) 1  Orientation (0/6) 5  Total 18  Adjusted Score (based on education) 19   The patient was placed into exam shorts and a chaperone was present.  Cranial nerves: There is good facial symmetry. Pupils are equal round and reactive to light bilaterally. Fundoscopic exam is attempted but the disc margins are not well visualized bilaterally. Extraocular muscles are intact. The visual fields are full to confrontational testing. The speech is fluent and clear. Soft palate rises symmetrically and there is no tongue deviation.  No tongue fasciculations.  Hearing is decreased to conversational tone. Sensation: Sensation is intact to light and pinprick throughout (facial, trunk, extremities). Vibration is intact at the bilateral big toe. There is no extinction with double simultaneous stimulation. There is no sensory dermatomal level identified. Motor: Strength is 5/5 in the bilateral upper extremities.  Strength is 4+/5 in the bilateral knee flexors, 5/5 in the bilateral knee extensors, 5/5 with ankle and plantar dorsiflexion bilaterally.  There is some wasting of the muscles, including vastus medialis bilaterally and the left suprascapularis.   Shoulder shrug is equal and symmetric.  There is no pronator drift.  There are diffuse fasciculations across both gastrocnemius muscles bilaterally, left more than right.  There are no fasciculations elsewhere. Deep tendon reflexes: Deep tendon reflexes are 2-2+/4 at the bilateral  biceps, triceps, brachioradialis, patella and 1/4 at the bilateral achilles.  No pectoralis reflexes  were noted.  Plantar responses are downgoing bilaterally.  Movement examination: Tone: There is normal tone in the bilateral upper extremities.  The tone in the lower extremities is normal.  Abnormal movements: none Coordination:  There is no decremation with RAM's, with any form of RAMS, including alternating supination and pronation of the forearm, hand opening and closing, finger taps, heel taps and toe taps. Gait and Station: The patient has no difficulty arising out of a deep-seated chair without the use of the hands. The patient's stride length is normal with normal stride length.  The patient has a neg pull test.    He actually puts his foot up one at a time on the chair while standing to tie his shoes and is able to balance.  Lab Results  Component Value Date   [id number] 05/04/2015    No results found for: RPR  Lab Results  Component Value Date   TSH 1.071 04/10/2015     ASSESSMENT/PLAN:  1.  Intermittent apraxia of gait  -I saw no evidence of idiopathic Parkinsons disease today.    Pts wife reports that he has had intermittent problems walking for years when he gets sick and I suspect that this is apraxia of gait that this is probably from dementia.   He is off of levodopa and doing well off of it.  2.  Subcortical dementia  -He saw Dr. Richrd Sox for testing on May 9 and follow-up on May 19.  Her reports indicate that the patient had a moderate subcortical dementia (not Alzheimer's dementia).  He has stopped driving and guns have been removed from the home.  -Patient and wife report that he is clinically doing much better now that his clonazepam dosage has been markedly reduced by psychiatry.  He used to be at 2 mg 3 times per day and is now down to 0.5 mg once per day.  Other medications have been reduced as well.  The patient would like to resume driving.  I told him that he could not resume driving without repeating neuropsych testing and without a formal driving  evaluation.  -Talked to him about importance of safe, CV exercise  -Talked to him about mental exercises  -he is doing very well on Aricept, 10 mg daily.  I refilled this today.    3.   Fasciculations.  -Suspect that these are benign, as they are localized gastrocnemius bilaterally.  However, he does have some wasting of the muscles and some weakness in the proximal lower extremities.  I am going to do an MRI of the lumbar spine and EMG.  Following the results of these, I will decide whether or not we need to work this up further.  4.  Much greater than 50% of this visit was spent in counseling and coordinating care.  Total face to face time:  35 min

## 2015-10-11 ENCOUNTER — Ambulatory Visit (INDEPENDENT_AMBULATORY_CARE_PROVIDER_SITE_OTHER): Payer: PPO | Admitting: Neurology

## 2015-10-11 ENCOUNTER — Encounter: Payer: Self-pay | Admitting: Neurology

## 2015-10-11 VITALS — BP 140/70 | HR 68 | Ht 69.0 in | Wt 158.0 lb

## 2015-10-11 DIAGNOSIS — R253 Fasciculation: Secondary | ICD-10-CM

## 2015-10-11 DIAGNOSIS — M545 Low back pain, unspecified: Secondary | ICD-10-CM

## 2015-10-11 DIAGNOSIS — F0150 Vascular dementia without behavioral disturbance: Secondary | ICD-10-CM

## 2015-10-11 MED ORDER — DONEPEZIL HCL 10 MG PO TABS: 10.0000 mg | ORAL_TABLET | Freq: Every day | ORAL | 5 refills | Status: DC

## 2015-10-11 NOTE — Patient Instructions (Signed)
1. We will call you to schedule EMG testing.   2. We have sent a referral to [facility] for your MRI and they will call you directly to schedule your appt. They are located at [place]. If you need to contact them directly please call [phone].

## 2015-10-17 ENCOUNTER — Ambulatory Visit (INDEPENDENT_AMBULATORY_CARE_PROVIDER_SITE_OTHER): Payer: PPO | Admitting: Neurology

## 2015-10-17 DIAGNOSIS — R253 Fasciculation: Secondary | ICD-10-CM

## 2015-10-17 DIAGNOSIS — M5417 Radiculopathy, lumbosacral region: Secondary | ICD-10-CM

## 2015-10-17 NOTE — Procedures (Signed)
[facility]  [address]  [place], [place] [address] Tel: [phone] Fax:  [phone] Test Date:  10/17/2015  Patient: Ricky Conley DOB: 04/09/1945 Physician: Narda Amber, DO  Sex: Male Height: 5\' 9"  Ref Phys: Alonza Bogus, M.D.  ID#: [id number]   Technician: Jerilynn Mages. Dean   Patient Complaints: This is 70 year old gentleman referred for evaluation of bilateral lower extremity fasciculations and weakness.  NCV & EMG Findings: Extensive electrodiagnostic testing of the right lower extremity and additional studies of the left shows: 1. Bilateral sural and superficial peroneal sensory responses are within normal limits. 2. Right peroneal motor response shows reduced amplitude at the extensor digitorum brevis, however, peroneal motor responses within normal limits at the tibialis anterior. Bilateral tibial and left peroneal motor responses within normal limits. 3. Chronic motor axonal loss changes are seen affecting the S1 myotomes bilaterally, without accompanied active denervation.  Fasciculation potentials are also seen in the same myotome.   Impression: 1. 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 2. There is no evidence of a sensorimotor polyneuropathy or disorder of anterior horn cells.   ___________________________ Narda Amber, DO    Nerve Conduction Studies Anti Sensory Summary Table   Stim Site NR Peak (ms) Norm Peak (ms) P-T Amp (V) Norm P-T Amp  Left Sup Peroneal Anti Sensory (Ant Lat Mall)  32.8C  12 cm    3.8 <4.6 3.5 >3  Right Sup Peroneal Anti Sensory (Ant Lat Mall)  32.8C  12 cm    4.3 <4.6 5.0 >3  Left Sural Anti Sensory (Lat Mall)  32.8C  Calf    3.9 <4.6 5.8 >3  Right Sural Anti Sensory (Lat Mall)  32.8C  Calf    3.7 <4.6 4.5 >3   Motor Summary Table   Stim Site NR Onset (ms) Norm Onset (ms) O-P Amp (mV) Norm O-P Amp Site1 Site2 Delta-0 (ms) Dist (cm) Vel (m/s) Norm Vel  (m/s)  Left Peroneal Motor (Ext Dig Brev)  32.8C  Ankle    4.8 <6.0 3.5 >2.5 B Fib Ankle 6.9 32.0 46 >40  B Fib    11.7  3.2  Poplt B Fib 2.0 10.0 50 >40  Poplt    13.7  2.9         Right Peroneal Motor (Ext Dig Brev)  32.8C  Ankle    5.7 <6.0 0.7 >2.5 B Fib Ankle 2.7 35.5 131 >40  B Fib    8.4  0.6  Poplt B Fib 2.0 10.0 50 >40  Poplt    10.4  0.7         Right Peroneal TA Motor (Tib Ant)  32.8C  Fib Head    2.5 <4.5 5.4 >3 Poplit Fib Head 1.8 10.0 56 >40  Poplit    4.3  5.1         Left Tibial Motor (Abd Butcher Brev)  32.8C  Ankle    4.5 <6.0 11.3 >4 Knee Ankle 9.6 41.0 43 >40  Knee    14.1  8.6         Right Tibial Motor (Abd Percifield Brev)  32.8C  Ankle    4.5 <6.0 12.3 >4 Knee Ankle 9.7 40.0 41 >40  Knee    14.2  7.3          H Reflex Studies   NR H-Lat (ms) Lat Norm (ms) L-R H-Lat (ms) M-Lat (ms) HLat-MLat (ms)  Left Tibial (Gastroc)  32.[place]  33.47 <35 3.95 4.76 28.71  Right Tibial (Gastroc)  32.8C     37.41 <35 3.95 4.76 32.65   EMG   Side Muscle Ins Act Fibs Psw Fasc Number Recrt Dur Dur. Amp Amp. Poly Poly. Comment  Right AntTibialis Nml Nml Nml Nml Nml Nml Nml Nml Nml Nml Nml Nml N/A  Right BicepsFemS Nml Nml Nml 1+ 1- Mod-R Some 1+ Some 1+ Nml Nml N/A  Right Gastroc Nml Nml Nml 1+ 1- Rapid Some 1+ Some 1+ Nml Nml N/A  Right Flex Dig Long Nml Nml Nml Nml Nml Nml Nml Nml Nml Nml Nml Nml N/A  Right RectFemoris Nml Nml Nml Nml Nml Nml Nml Nml Nml Nml Nml Nml N/A  Right GluteusMed Nml Nml Nml Nml Nml Nml Nml Nml Nml Nml Nml Nml N/A  Left BicepsFemS Nml Nml Nml 1+ 1- Rapid Some 1+ Some 1+ Nml Nml N/A  Left RectFemoris Nml Nml Nml Nml Nml Nml Nml Nml Nml Nml Nml Nml N/A  Left GluteusMed Nml Nml Nml Nml Nml Nml Nml Nml Nml Nml Nml Nml N/A  Left AntTibialis Nml Nml Nml Nml Nml Nml Nml Nml Nml Nml Nml Nml N/A  Left Gastroc Nml Nml Nml 2+ 1- Rapid Some 1+ Some 1+ Nml Nml N/A  Left Flex Dig Long Nml Nml Nml Nml Nml Nml Nml Nml Nml Nml Nml Nml N/A      Waveforms:

## 2015-10-18 ENCOUNTER — Telehealth: Payer: Self-pay | Admitting: Neurology

## 2015-10-18 NOTE — Telephone Encounter (Signed)
-----   Message from [place], DO sent at 10/17/2015  9:25 PM EDT ----- Let pt know that EMG showed bilateral chronic pinched S1 nerve root from back.  If not already done so, should look at MRI lumbar spine

## 2015-10-18 NOTE — Telephone Encounter (Signed)
Patient's wife made aware. MR is scheduled for 10/23/15.

## 2015-10-19 ENCOUNTER — Other Ambulatory Visit: Payer: PPO

## 2015-10-23 ENCOUNTER — Ambulatory Visit
Admission: RE | Admit: 2015-10-23 | Discharge: 2015-10-23 | Disposition: A | Discharge status: Home / Self Care | Payer: PPO | Source: Ambulatory Visit | Attending: Neurology | Admitting: Neurology

## 2015-10-23 DIAGNOSIS — M545 Low back pain, unspecified: Secondary | ICD-10-CM

## 2015-10-23 DIAGNOSIS — R253 Fasciculation: Secondary | ICD-10-CM

## 2015-10-23 DIAGNOSIS — M4806 Spinal stenosis, lumbar region: Secondary | ICD-10-CM | POA: Diagnosis not present

## 2015-10-23 MED ORDER — GADOBENATE DIMEGLUMINE 529 MG/ML IV SOLN
15.0000 mL | Freq: Once | INTRAVENOUS | Status: AC | PRN
  Administered 2015-10-23: 15 mL via INTRAVENOUS

## 2015-10-24 ENCOUNTER — Telehealth: Payer: Self-pay | Admitting: Neurology

## 2015-10-24 NOTE — Telephone Encounter (Signed)
Spoke with patient's wife and made aware of results. Patient has a hard time hearing on the phone. They would like a referral to talk to neurosurgeon. Referral faxed to [facility] at [phone] with confirmation received. They will contact the patient to schedule.

## 2015-10-24 NOTE — Telephone Encounter (Signed)
-----   Message from [place], DO sent at 10/24/2015  7:28 AM EDT ----- Let pt know that he has significant pinching of the nerves of the low back to the leg.  How would pt feel about neurosx referral?  I know his pain wasn't terrible but I don't want him to lose function in some of the leg muscles

## 2015-10-31 ENCOUNTER — Other Ambulatory Visit (INDEPENDENT_AMBULATORY_CARE_PROVIDER_SITE_OTHER): Payer: PPO

## 2015-10-31 DIAGNOSIS — R7989 Other specified abnormal findings of blood chemistry: Secondary | ICD-10-CM

## 2015-10-31 DIAGNOSIS — R945 Abnormal results of liver function studies: Principal | ICD-10-CM

## 2015-10-31 LAB — HEPATIC FUNCTION PANEL
ALK PHOS: 115 U/L (ref 39–117)
ALT: 25 U/L (ref 0–53)
AST: 27 U/L (ref 0–37)
Albumin: 3.6 g/dL (ref 3.5–5.2)
BILIRUBIN DIRECT: 0.1 mg/dL (ref 0.0–0.3)
BILIRUBIN TOTAL: 0.5 mg/dL (ref 0.2–1.2)
Total Protein: 6.8 g/dL (ref 6.0–8.3)

## 2015-11-08 DIAGNOSIS — M4310 Spondylolisthesis, site unspecified: Secondary | ICD-10-CM | POA: Diagnosis not present

## 2015-11-20 DIAGNOSIS — I10 Essential (primary) hypertension: Secondary | ICD-10-CM | POA: Diagnosis not present

## 2015-11-20 DIAGNOSIS — Z23 Encounter for immunization: Secondary | ICD-10-CM | POA: Diagnosis not present

## 2015-11-20 DIAGNOSIS — E119 Type 2 diabetes mellitus without complications: Secondary | ICD-10-CM | POA: Diagnosis not present

## 2015-11-20 DIAGNOSIS — E7800 Pure hypercholesterolemia, unspecified: Secondary | ICD-10-CM | POA: Diagnosis not present

## 2015-11-20 DIAGNOSIS — F0280 Dementia in other diseases classified elsewhere without behavioral disturbance: Secondary | ICD-10-CM | POA: Diagnosis not present

## 2015-11-20 DIAGNOSIS — M48062 Spinal stenosis, lumbar region with neurogenic claudication: Secondary | ICD-10-CM | POA: Diagnosis not present

## 2015-11-20 DIAGNOSIS — F324 Major depressive disorder, single episode, in partial remission: Secondary | ICD-10-CM | POA: Diagnosis not present

## 2015-11-21 DIAGNOSIS — F3342 Major depressive disorder, recurrent, in full remission: Secondary | ICD-10-CM | POA: Diagnosis not present

## 2015-11-22 DIAGNOSIS — L719 Rosacea, unspecified: Secondary | ICD-10-CM | POA: Diagnosis not present

## 2015-11-22 DIAGNOSIS — L821 Other seborrheic keratosis: Secondary | ICD-10-CM | POA: Diagnosis not present

## 2015-11-23 DIAGNOSIS — M4310 Spondylolisthesis, site unspecified: Secondary | ICD-10-CM | POA: Diagnosis not present

## 2015-11-27 DIAGNOSIS — Z933 Colostomy status: Secondary | ICD-10-CM | POA: Diagnosis not present

## 2015-12-07 ENCOUNTER — Encounter: Payer: Self-pay | Admitting: Family

## 2015-12-07 DIAGNOSIS — Z136 Encounter for screening for cardiovascular disorders: Secondary | ICD-10-CM

## 2015-12-19 DIAGNOSIS — M48061 Spinal stenosis, lumbar region without neurogenic claudication: Secondary | ICD-10-CM | POA: Diagnosis not present

## 2015-12-19 DIAGNOSIS — M4310 Spondylolisthesis, site unspecified: Secondary | ICD-10-CM | POA: Diagnosis not present

## 2015-12-20 ENCOUNTER — Ambulatory Visit (HOSPITAL_BASED_OUTPATIENT_CLINIC_OR_DEPARTMENT_OTHER): Payer: Medicare Other | Admitting: Anesthesiology

## 2015-12-20 ENCOUNTER — Encounter (HOSPITAL_BASED_OUTPATIENT_CLINIC_OR_DEPARTMENT_OTHER): Payer: Self-pay

## 2015-12-20 ENCOUNTER — Encounter (HOSPITAL_BASED_OUTPATIENT_CLINIC_OR_DEPARTMENT_OTHER)
Admission: RE | Disposition: A | Discharge status: Home / Self Care | Payer: Self-pay | Source: Ambulatory Visit | Attending: Gastroenterology

## 2015-12-20 ENCOUNTER — Ambulatory Visit (HOSPITAL_BASED_OUTPATIENT_CLINIC_OR_DEPARTMENT_OTHER): Payer: Medicare Other | Admitting: Gastroenterology

## 2015-12-20 ENCOUNTER — Ambulatory Visit
Admission: RE | Admit: 2015-12-20 | Discharge: 2015-12-20 | Disposition: A | Discharge status: Home / Self Care | Payer: Medicare Other | Source: Ambulatory Visit | Attending: Gastroenterology | Admitting: Gastroenterology

## 2015-12-20 DIAGNOSIS — D124 Benign neoplasm of descending colon: Secondary | ICD-10-CM | POA: Insufficient documentation

## 2015-12-20 DIAGNOSIS — Z1211 Encounter for screening for malignant neoplasm of colon: Secondary | ICD-10-CM | POA: Insufficient documentation

## 2015-12-20 DIAGNOSIS — K648 Other hemorrhoids: Secondary | ICD-10-CM | POA: Insufficient documentation

## 2015-12-20 DIAGNOSIS — D500 Iron deficiency anemia secondary to blood loss (chronic): Secondary | ICD-10-CM | POA: Insufficient documentation

## 2015-12-20 DIAGNOSIS — K219 Gastro-esophageal reflux disease without esophagitis: Secondary | ICD-10-CM | POA: Insufficient documentation

## 2015-12-20 DIAGNOSIS — R739 Hyperglycemia, unspecified: Secondary | ICD-10-CM | POA: Insufficient documentation

## 2015-12-20 DIAGNOSIS — K635 Polyp of colon: Secondary | ICD-10-CM | POA: Insufficient documentation

## 2015-12-20 HISTORY — DX: Hyperlipidemia, unspecified: E78.5

## 2015-12-20 HISTORY — DX: Type 2 diabetes mellitus without complications: E11.9

## 2015-12-20 HISTORY — DX: Essential (primary) hypertension: I10

## 2015-12-20 HISTORY — PX: EGD, COLONOSCOPY: SHX3799

## 2015-12-20 LAB — VH DEXTROSE STICK GLUCOSE: Glucose POCT: 144 mg/dL — ABNORMAL HIGH (ref 70–99)

## 2015-12-20 SURGERY — EGD, COLONOSCOPY
Anesthesia: Anesthesia MAC / Sedation | Site: Abdomen | Wound class: Clean Contaminated

## 2015-12-20 MED ORDER — FENTANYL CITRATE (PF) 50 MCG/ML IJ SOLN (WRAP)
INTRAMUSCULAR | Status: DC | PRN
Start: 2015-12-20 — End: 2015-12-20
  Administered 2015-12-20 (×2): 50 ug via INTRAVENOUS

## 2015-12-20 MED ORDER — FENTANYL CITRATE (PF) 50 MCG/ML IJ SOLN (WRAP)
INTRAMUSCULAR | Status: AC
Start: 2015-12-20 — End: ?
  Filled 2015-12-20: qty 2

## 2015-12-20 MED ORDER — ONDANSETRON HCL 4 MG/2ML IJ SOLN
4.0000 mg | INTRAMUSCULAR | Status: DC | PRN
Start: 2015-12-20 — End: 2015-12-20

## 2015-12-20 MED ORDER — PROPOFOL 200 MG/20ML IV EMUL
INTRAVENOUS | Status: AC
Start: 2015-12-20 — End: ?
  Filled 2015-12-20: qty 20

## 2015-12-20 MED ORDER — PROPOFOL 200 MG/20ML IV EMUL
INTRAVENOUS | Status: DC | PRN
Start: 2015-12-20 — End: 2015-12-20
  Administered 2015-12-20: 50 mg via INTRAVENOUS
  Administered 2015-12-20: 100 mg via INTRAVENOUS
  Administered 2015-12-20: 10 mg via INTRAVENOUS
  Administered 2015-12-20: 20 mg via INTRAVENOUS
  Administered 2015-12-20: 80 mg via INTRAVENOUS
  Administered 2015-12-20: 50 mg via INTRAVENOUS

## 2015-12-20 MED ORDER — SODIUM CHLORIDE 0.9 % IV SOLN
INTRAVENOUS | Status: DC
Start: 2015-12-20 — End: 2015-12-20

## 2015-12-20 MED ORDER — ONDANSETRON 4 MG PO TBDP
4.0000 mg | ORAL_TABLET | ORAL | Status: DC | PRN
Start: 2015-12-20 — End: 2015-12-20

## 2015-12-20 MED ORDER — PEG 3350-KCL-NABCB-NACL-NASULF 236 G PO SOLR
4.0000 L | ORAL | Status: DC | PRN
Start: 2015-12-20 — End: 2015-12-20

## 2015-12-20 SURGICAL SUPPLY — 23 items
CATH GLD PROBE BICAP 7FX210CM (Supply) IMPLANT
CATH GLD PROBE BICAP 7FX300CM (Supply) IMPLANT
CRE BALLOON 10-12 DILATOR 5835 (Supply) IMPLANT
CRE BALLOON 12-15 DILATOR 5836 (TDC (Tubes, Draines, Catheters))
CRE BALLOON 12-15 DILATOR 5836 (TDC (Tubes, Drains, Catheters)) IMPLANT
CRE BALLOON 15-18 DILATOR 5837 (Supply) IMPLANT
CRE BALLOON 18-20 DILATOR 5838 (Supply) IMPLANT
CRE BALLOON 6-8 DILAT 5833 (Supply) IMPLANT
CRE BALLOON 8-10 DILAT 5834 (Supply) IMPLANT
CRE PYLORIC COL 10-12 DIL 5847 (Supply) IMPLANT
CRE PYLORIC COL 12-15 DIL 5848 (Supply) IMPLANT
CRE PYLORIC COL 15-18 DIL 5849 (Supply) IMPLANT
CRE PYLORIC COL 8-10 DIL 5846 (Supply) IMPLANT
DEVICE STERIFLATE INFLATION (Supply) IMPLANT
DIL CRE 18-20 PYL CLN 5850 (Supply) IMPLANT
FORCEP BIOPSY HOT RAD JAW 4 (Supply) IMPLANT
FORCEP BIOPSY RAD JAW 1333-40 (Supply) IMPLANT
FORCEP RADIAL JAW JUMBO 240CM (Supply) IMPLANT
MARKER ENDOSCOPIC SPOT (Supply) IMPLANT
NDL INTERJECT SCLERO 25G (Supply) IMPLANT
SNARE CAPII STIFF 10MM RD 40BX (Supply) IMPLANT
SNARE ROTATE SM OVAL MED STFF (Supply) IMPLANT
SNARE SMALL CAPTIV 6230 (Supply) ×2 IMPLANT

## 2015-12-20 NOTE — Discharge Instr - AVS First Page (Signed)
[facility]  Colonscopy/Upper Endoscopy Discharge Instructions    Dear Patient,  Thank you for allowing us to be a part of your health care experience.  We realize you may not fully recall your discharge care.  The following information is to guide you.    A.  After you leave the hospital:   1.  Due to the effects of the sedatives, you may feel tired for the remainder of today.   2.  DO NOT DRIVE OR OPERATE HAZARDOUS MACHINERY until tomorrow.   3.  Please rest and drink extra fluids today.  Avoid alcohol today.   4.  Resume your normal diet as tolerated.   5.  A sore throat or hoarse throat is normal.  Gargle with warm salt water or use a throat lozenge.   6.  If you experience anal soreness; you may apply a soothing ointment of your choice.   7.  It is normal not to have a bowel movement for 1-2 days after a colonoscopy.   8.  Resume your normal activities tomorrow.    B.  If you experience any of the following danger signs or symptoms, call your doctor immediately:  After business hours call [phone] for [facility] for physician guidance.   1.  Vomiting blood, shortness of breath, pain spreading throughout the chest with swallowing   2.  Rectal bleeding (fresh onset of continual clotted blood).   3.  Passing obvious blood in the stool repeatedly or a change of stool consistency and color (black).   4.  Redness or swelling at the IV insertion site that continues or worsens over 2-3 days.  Initial warm compresses may be helpful.   5.  New onset of distention or pain, that does not go away or prevents you from normal daily activity.

## 2015-12-20 NOTE — Anesthesia Preprocedure Evaluation (Addendum)
Anesthesia Evaluation    AIRWAY    Mallampati: II    TM distance: >3 FB  Neck ROM: full  Mouth Opening:full   CARDIOVASCULAR    cardiovascular exam normal, regular and normal       DENTAL        (+) upper dentures   PULMONARY    pulmonary exam normal and clear to auscultation     OTHER FINDINGS    Lower partials      PSS Anesthesia Comments: gerd        Anesthesia Plan    ASA 2     MAC               (Risks discussed including but not limited to oral trauma and aspiration pneumonia.  Questions answered.  Pt/guardian understands and agrees to proceed)      Detailed anesthesia plan: MAC                ECG reviewed    imaging results reviewed           Signed by: Darrell Jewel 12/20/15 10:00 AM

## 2015-12-20 NOTE — Transfer of Care (Signed)
Anesthesia Transfer of Care Note    Patient: Henry Hines    Last vitals:   Vitals:    12/20/15 1039   BP: 112/65   Pulse: 77   Resp: 16   SpO2: 99%       Oxygen: Room Air     Mental Status:awake    Airway: Natural    Cardiovascular Status:  stable

## 2015-12-20 NOTE — Anesthesia Postprocedure Evaluation (Signed)
Anesthesia Post Evaluation    Patient: Henry Hines    Procedures performed: Procedure(s):  EGD, COLONOSCOPY    Anesthesia type: MAC    Patient location:[facility]    Last vitals:   Vitals:    12/20/15 1039   BP: 112/65   Pulse: 77   Resp: 16   SpO2: 99%       Post pain: Patient not complaining of pain, continue current therapy      Mental Status:awake    Respiratory Function: tolerating room air    Cardiovascular: stable    Nausea/Vomiting: patient not complaining of nausea or vomiting    Hydration Status: adequate    Post assessment: no apparent anesthetic complications

## 2015-12-21 ENCOUNTER — Encounter (HOSPITAL_BASED_OUTPATIENT_CLINIC_OR_DEPARTMENT_OTHER): Payer: Self-pay | Admitting: Gastroenterology

## 2015-12-25 ENCOUNTER — Telehealth: Payer: Self-pay | Admitting: Gastroenterology

## 2015-12-25 NOTE — Telephone Encounter (Signed)
Wife reports that the patient has a "sinking feeling in his gallbladder area". She was unable to provide any more information.  She did report that he does not have pain, nausea or vomiting. He is scheduled for an appt at 10:45 on 02/19/16

## 2016-01-08 DIAGNOSIS — L570 Actinic keratosis: Secondary | ICD-10-CM | POA: Diagnosis not present

## 2016-01-08 DIAGNOSIS — L719 Rosacea, unspecified: Secondary | ICD-10-CM | POA: Diagnosis not present

## 2016-01-08 NOTE — Progress Notes (Signed)
Ricky Conley was seen today in the movement disorders clinic for neurologic consultation at the request of Shirline Frees, MD.  The consultation is for the evaluation of Parkinsons disease.  The patient is seen today after his recent hospitalizations.  I have reviewed hospital records made available to me.  He is accompanied by his wife who supplements the history.    He was hospitalized from February 26 to March 1 after mental status change, which was ultimately determined due to influenza.  He returned back to the hospital on March 7 to March 9 with increasing weakness.  He ended up seeing neurology and was felt that he possibly had Parkinson's disease or apraxia of gait.  He was given a trial of low dose levodopa (6am/2pm/10pm).  When they followed up with him the following day, he was not able to articulate whether this helped or not, primarily because the patient was a poor historian.   He does state that today that his walking is 200% better and he no longer has to use the walker.  His wife states that prior to his hospitalization he couldn't sit up on the side of the bed because he would fall over but he doesn't do that.  His wife states that about 2 years ago, he would start having trouble getting up if he was exhausted or if he was sick.   He also has a history of bipolar disorder and was on Depakote and clonazepam, which were to be monitored closely as an outpatient as the patient's liver enzymes were high when he was in the hospital.   07/11/15 update:  The patient is following up today, accompanied by his wife who supplements the history.  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  I weaned him off of levodopa.  He is doing well off of this.   He had some lab work.  [name] was 747.  His RPR was negative.  He saw Dr. Richrd Sox for testing on May 9 and follow-up on May 19.  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  She recommended that he discontinue driving.  She told his wife to remove the guns from the home, which was done.  She also recommended that he follow up with Dr. Casimiro Needle and perhaps consider changing his clonazepam to something else.  He is in the process of reducing it.  Pt states "my memory is much better" but his wife does wink at me.  He was on 2 mg tid but has decreased to 1 mg bid.  I did review the records since last visit.  He went to the hospital on May 18 and ended up having an ERCP and 2 stones extracted.  10/11/15 update:  The patient follows up today, accompanied by his wife who supplements the history.  I have reviewed records available since last visit.  The patient is no longer driving.  I did get him started on Aricept last visit and he has worked his way to 10 mg daily.  He is doing well with the medication.  They deny any side effects.  No diarrhea.  No abdominal pain.  Last visit, he had seen Dr. Si Raider and she had recommended that they talk to his psychiatrist about continuing to reduce his clonazepam.  He has done this and  he is all the way down to 0.5 mg daily (was at 6 mg daily at one point in time).  He is really feeling great and feels much more clear cognitively.  His wife agrees, but he wants to start driving again and his wife is not so sure.  He asks me about fasciculations in his legs.  He does not know how long they have been going on.  No weakness.  No difficulty with swallowing.  He has some back pain, but describes it as "tailbone pain" ever since his colon cancer and resection.  01/10/16 update:  The patient follows up today, accompanied by his wife who supplements the history.  The patient is on Aricept for dementia and is tolerating this well.  States that his memory is good.  States that he is going crossword puzzles and word searches.  States that he is doing word association  to help remember things and it has helped.  Wants to drive again and willing to undergo driving eval to do so. He has had no falls.  He did have an EMG done in September, 2017 because of fasciculations, which demonstrated bilateral, mild tomoderate, chronic, S1 radiculopathy.  He had an MRI of the lumbar spine demonstrating chronic lumbar spine degeneration, moderate severe spinal stenosis at L3-L4 and L4-L5, severe left lateral recess stenosis at L5-S1, and moderate or severe neural foraminal stenosis at the left L3, bilateral L4, and bilateral L5 nerve levels.  He was referred for a neurosx opinion.  I called for notes but didn't receive them.  He saw Dr. Trenton Gammon and was referred for injections with Dr. Maryjean Ka.  Was dx with rosacea since our last visit.     ALLERGIES:  No Known Allergies  CURRENT MEDICATIONS:  Outpatient Encounter Prescriptions as of 01/10/2016  Medication Sig  . amLODipine (NORVASC) 5 MG tablet Take 1 tablet (5 mg total) by mouth daily.  Marland Kitchen aspirin 81 MG EC tablet Take 81 mg by mouth every morning.   Marland Kitchen buPROPion (WELLBUTRIN XL) 300 MG 24 hr tablet Take 300 mg by mouth every morning.  . cholecalciferol (VITAMIN D) 1000 units tablet Take 1,000 Units by mouth daily.  . clonazePAM (KLONOPIN) 0.5 MG tablet Take 0.5 mg by mouth daily.  . divalproex (DEPAKOTE ER) 500 MG 24 hr tablet Take 1,500 mg by mouth every morning.  . donepezil (ARICEPT) 10 MG tablet Take 1 tablet (10 mg total) by mouth at bedtime.  Marland Kitchen doxycycline (VIBRA-TABS) 100 MG tablet Take 1 tablet by mouth 2 (two) times daily.  Marland Kitchen gabapentin (NEURONTIN) 300 MG capsule Take two capsules in the morning, two capsules in the afternoon and one capsule in the evening.  Marland Kitchen Hydrocortisone (0000000 EX) Apply 1 application topically daily as needed (Applies to stoma for redness.). Reported on 07/11/2015   No facility-administered encounter medications on file as of 01/10/2016.     PAST MEDICAL HISTORY:   Past Medical History:    Diagnosis Date  . Anxiety   . Arthritis   . Ascending aortic aneurysm (HCC)    Dr. Cyndia Bent follows- last check 2 yrs.  . Bipolar disorder ([place])    tx Depakote  . Colon cancer [facility]) 2001   Chemotherapy, radiation- no longer seeing oncology  . Depression   . Diabetes mellitus without complication ([place])    Type II " controlled with diet"  . Diet-controlled diabetes mellitus ([place])   . Gallstones, common bile duct   . Hyperlipidemia   . Hypertension   .  Impaired hearing    wears hearing aid left ear.  . Neuromuscular disorder ([place])    rectal nerve damage' oversewed" chronic pain tx. Gabapentin.  . Rectal cancer ([place])   . Subcortical vascular dementia     PAST SURGICAL HISTORY:   Past Surgical History:  Procedure Laterality Date  . BACK SURGERY  2003  . CARPAL TUNNEL RELEASE Bilateral 2008  . COLECTOMY  2001  . COLONOSCOPY    . COLOSTOMY  2001  . ERCP N/A 06/29/2015   Procedure: ENDOSCOPIC RETROGRADE CHOLANGIOPANCREATOGRAPHY (ERCP);  Surgeon: Ladene Artist, MD;  Location: [facility];  Service: Endoscopy;  Laterality: N/A;  . FRACTURE SURGERY Left 2009   ankle with hardware    SOCIAL HISTORY:   Social History   Social History  . Marital status: Married    Spouse name: [name]  . Number of children: 4  . Years of education: N/A   Occupational History  . retired     Curator   Social History Main Topics  . Smoking status: Former Smoker    Quit date: 05/30/1970  . Smokeless tobacco: Former Systems developer    Quit date: 05/30/1998     Comment: quit chewing tobacco approx 2012  . Alcohol use No  . Drug use: No  . Sexual activity: Not on file   Other Topics Concern  . Not on file   Social History Narrative   Married to wife, [name]   Four grown children   HOH and frequent falls   Memory deficit and confusion    FAMILY HISTORY:   Family Status  Relation Status  . Mother Deceased   alzheimers, HTN, depression  . Father Deceased   stroke  . Sister Alive   x2 - skin  cancer  . Brother Alive   x1 - stroke  . Child Alive   2 sons, 2 daughters - alive and well    ROS:  A complete 10 system review of systems was obtained and was unremarkable apart from what is mentioned above.  PHYSICAL EXAMINATION:    VITALS:   Vitals:   01/10/16 1017  BP: (!) 142/80  Pulse: (!) 58  Resp: (!) 98  Weight: 153 lb (69.4 kg)  Height: 5\' 10"  (1.778 m)    GEN:  The patient appears stated age and is in NAD. HEENT:  Normocephalic, atraumatic.  The mucous membranes are moist. The superficial temporal arteries are without ropiness or tenderness. CV:  RRR Lungs:  CTAB Neck/HEME:  There are no carotid bruits bilaterally.  Neurological examination:  Orientation:  Montreal Cognitive Assessment  01/10/2016 05/04/2015  Visuospatial/ Executive (0/5) 2 2  Naming (0/3) 2 3  Attention: Read list of digits (0/2) 2 2  Attention: Read list of letters (0/1) 1 1  Attention: Serial 7 subtraction starting at 100 (0/3) 3 1  Language: Repeat phrase (0/2) 2 2  Language : Fluency (0/1) 0 0  Abstraction (0/2) 1 1  Delayed Recall (0/5) 1 1  Orientation (0/6) 5 5  Total 19 18  Adjusted Score (based on education) 20 19    Cranial nerves: There is good facial symmetry.  Extraocular muscles are intact. The visual fields are full to confrontational testing. The speech is fluent and clear. Soft palate rises symmetrically and there is no tongue deviation.  No tongue fasciculations.  Hearing is decreased to conversational tone. Sensation: Sensation is intact to light touch throughout. Motor: Strength is 5/5 in the bilateral upper extremities.  Strength is 4+/5 in the  bilateral knee flexors, 5/5 in the bilateral knee extensors, 5/5 with ankle and plantar dorsiflexion bilaterally.  There is some wasting of the muscles, including vastus medialis bilaterally and the left suprascapularis.   Shoulder shrug is equal and symmetric.  There is no pronator drift.  There are diffuse fasciculations across  both gastrocnemius muscles bilaterally, left more than right.  There are no fasciculations elsewhere.  Movement examination: Tone: There is normal tone in the bilateral upper extremities.  The tone in the lower extremities is normal.  Abnormal movements: none Coordination:  There is no decremation with RAM's, with any form of RAMS, including alternating supination and pronation of the forearm, hand opening and closing, finger taps, heel taps and toe taps. Gait and Station: The patient has no difficulty arising out of a deep-seated chair without the use of the hands. The patient's stride length is normal with normal stride length.    Lab Results  Component Value Date   [id number] 05/04/2015    No results found for: RPR  Lab Results  Component Value Date   TSH 1.071 04/10/2015     ASSESSMENT/PLAN:  1.  Intermittent apraxia of gait  -I saw no evidence of idiopathic Parkinsons disease today.    Pts wife reports that he has had intermittent problems walking for years when he gets sick and I suspect that this is apraxia of gait that this is probably from dementia.   He is off of levodopa and doing well off of it.  2.  Subcortical dementia  -He saw Dr. Richrd Sox for testing on May 9 and follow-up on May 19.  Her reports indicate that the patient had a moderate subcortical dementia (not Alzheimer's dementia).  He has stopped driving and guns have been removed from the home.  However, he wants to drive again.  Would need formal driving eval.  I discouraged it but he has greatly reduced med since prior neuropsych eval(prior klonopin 2mg  tid and now 0.5 mg q hs).  He wants to schedule it and will schedule via [facility].    -he is doing very well on Aricept, 10 mg daily.   -Pt wants to spread out visits to yearly.  Will let me know if issues before that time.  3.   Chronic S1 radiculopathy  -confirmed via EMG and MRI lumbar spine.  Referred to neurosx and he is having injections  4.  Much greater  than 50% of this visit was spent in counseling and coordinating care.  Total face to face time:  35 min

## 2016-01-10 ENCOUNTER — Encounter: Payer: Self-pay | Admitting: Neurology

## 2016-01-10 ENCOUNTER — Ambulatory Visit (INDEPENDENT_AMBULATORY_CARE_PROVIDER_SITE_OTHER): Payer: PPO | Admitting: Neurology

## 2016-01-10 VITALS — BP 142/80 | HR 58 | Resp 98 | Ht 70.0 in | Wt 153.0 lb

## 2016-01-10 DIAGNOSIS — F0391 Unspecified dementia with behavioral disturbance: Secondary | ICD-10-CM | POA: Diagnosis not present

## 2016-01-10 DIAGNOSIS — R482 Apraxia: Secondary | ICD-10-CM | POA: Diagnosis not present

## 2016-01-10 DIAGNOSIS — F0150 Vascular dementia without behavioral disturbance: Secondary | ICD-10-CM

## 2016-01-10 NOTE — Patient Instructions (Signed)
1. I have faxed referral to [facility]. They will call you directly to schedule an appt. They can be reached directly at [phone].

## 2016-01-17 DIAGNOSIS — M4310 Spondylolisthesis, site unspecified: Secondary | ICD-10-CM | POA: Diagnosis not present

## 2016-01-22 ENCOUNTER — Telehealth: Payer: Self-pay | Admitting: Neurology

## 2016-01-22 DIAGNOSIS — E785 Hyperlipidemia, unspecified: Secondary | ICD-10-CM | POA: Diagnosis not present

## 2016-01-22 DIAGNOSIS — F3342 Major depressive disorder, recurrent, in full remission: Secondary | ICD-10-CM | POA: Diagnosis not present

## 2016-01-22 NOTE — Telephone Encounter (Signed)
Received records from Dr. Maryjean Ka dated 12/19/2015.  He reports that he had seen the patient on 11/23/2015 and at that time did an L4-L5 epidural steroid injection.  Unfortunately, the note appears incomplete from November 7 and does not state what the efficacy of those injections were.  There were notes included from Dr. Annette Stable from 11/08/2015.  While he agreed that the patient had significant stenosis at the L4-L5 and L5-S1 level, he stated that the patient was not terribly symptomatic, which was the reason he sent him for steroid injection.

## 2016-01-30 ENCOUNTER — Ambulatory Visit
Admission: RE | Admit: 2016-01-30 | Discharge: 2016-01-30 | Disposition: A | Discharge status: Home / Self Care | Payer: Medicare Other | Source: Ambulatory Visit | Attending: Family | Admitting: Family

## 2016-01-30 DIAGNOSIS — Z136 Encounter for screening for cardiovascular disorders: Secondary | ICD-10-CM | POA: Insufficient documentation

## 2016-01-30 DIAGNOSIS — E119 Type 2 diabetes mellitus without complications: Secondary | ICD-10-CM | POA: Insufficient documentation

## 2016-01-30 DIAGNOSIS — Z794 Long term (current) use of insulin: Secondary | ICD-10-CM | POA: Insufficient documentation

## 2016-01-30 DIAGNOSIS — Z87891 Personal history of nicotine dependence: Secondary | ICD-10-CM | POA: Insufficient documentation

## 2016-01-30 DIAGNOSIS — E785 Hyperlipidemia, unspecified: Secondary | ICD-10-CM | POA: Insufficient documentation

## 2016-01-30 DIAGNOSIS — I10 Essential (primary) hypertension: Secondary | ICD-10-CM | POA: Insufficient documentation

## 2016-02-07 DIAGNOSIS — Z933 Colostomy status: Secondary | ICD-10-CM | POA: Diagnosis not present

## 2016-02-19 ENCOUNTER — Encounter: Payer: Self-pay | Admitting: Gastroenterology

## 2016-02-19 ENCOUNTER — Other Ambulatory Visit (INDEPENDENT_AMBULATORY_CARE_PROVIDER_SITE_OTHER): Payer: PPO

## 2016-02-19 ENCOUNTER — Ambulatory Visit (INDEPENDENT_AMBULATORY_CARE_PROVIDER_SITE_OTHER): Payer: PPO | Admitting: Gastroenterology

## 2016-02-19 ENCOUNTER — Other Ambulatory Visit: Payer: Self-pay

## 2016-02-19 VITALS — BP 118/68 | HR 72 | Ht 69.0 in | Wt 153.0 lb

## 2016-02-19 DIAGNOSIS — R945 Abnormal results of liver function studies: Principal | ICD-10-CM

## 2016-02-19 DIAGNOSIS — Z85048 Personal history of other malignant neoplasm of rectum, rectosigmoid junction, and anus: Secondary | ICD-10-CM

## 2016-02-19 DIAGNOSIS — Z860101 Personal history of adenomatous and serrated colon polyps: Secondary | ICD-10-CM

## 2016-02-19 DIAGNOSIS — R7989 Other specified abnormal findings of blood chemistry: Secondary | ICD-10-CM

## 2016-02-19 DIAGNOSIS — R10816 Epigastric abdominal tenderness: Secondary | ICD-10-CM

## 2016-02-19 DIAGNOSIS — Z86010 Personal history of colonic polyps: Secondary | ICD-10-CM | POA: Diagnosis not present

## 2016-02-19 DIAGNOSIS — Z933 Colostomy status: Secondary | ICD-10-CM | POA: Diagnosis not present

## 2016-02-19 LAB — COMPREHENSIVE METABOLIC PANEL
ALT: 27 U/L (ref 0–53)
AST: 35 U/L (ref 0–37)
Albumin: 3.4 g/dL — ABNORMAL LOW (ref 3.5–5.2)
Alkaline Phosphatase: 99 U/L (ref 39–117)
BUN: 27 mg/dL — AB (ref 6–23)
CALCIUM: 8.8 mg/dL (ref 8.4–10.5)
CHLORIDE: 104 meq/L (ref 96–112)
CO2: 33 meq/L — AB (ref 19–32)
CREATININE: 1.14 mg/dL (ref 0.40–1.50)
GFR: 67 mL/min (ref >60.00)
GLUCOSE: 103 mg/dL — AB (ref 70–99)
Potassium: 4.6 mEq/L (ref 3.5–5.1)
SODIUM: 144 meq/L (ref 135–145)
Total Bilirubin: 0.6 mg/dL (ref 0.2–1.2)
Total Protein: 6.2 g/dL (ref 6.0–8.3)

## 2016-02-19 LAB — TSH: TSH: 3.76 u[IU]/mL (ref 0.35–4.50)

## 2016-02-19 LAB — CBC WITH DIFFERENTIAL/PLATELET
BASOS PCT: 0.9 % (ref 0.0–3.0)
Basophils Absolute: 0.1 10*3/uL (ref 0.0–0.1)
EOS ABS: 0.1 10*3/uL (ref 0.0–0.7)
Eosinophils Relative: 1.6 % (ref 0.0–5.0)
HEMATOCRIT: 38.9 % — AB (ref 39.0–52.0)
Hemoglobin: 13.4 g/dL (ref 13.0–17.0)
LYMPHS PCT: 40.2 % (ref 12.0–46.0)
Lymphs Abs: 2.3 10*3/uL (ref 0.7–4.0)
MCHC: 34.5 g/dL (ref 30.0–36.0)
MCV: 92.3 fl (ref 78.0–100.0)
Monocytes Absolute: 0.6 10*3/uL (ref 0.1–1.0)
Monocytes Relative: 11.2 % (ref 3.0–12.0)
NEUTROS ABS: 2.6 10*3/uL (ref 1.4–7.7)
Neutrophils Relative %: 46.1 % (ref 43.0–77.0)
PLATELETS: 155 10*3/uL (ref 150.0–400.0)
RBC: 4.21 Mil/uL — ABNORMAL LOW (ref 4.22–5.81)
RDW: 14.2 % (ref 11.5–15.5)
WBC: 5.6 10*3/uL (ref 4.0–10.5)

## 2016-02-19 LAB — LIPASE: Lipase: 63 U/L — ABNORMAL HIGH (ref 11.0–59.0)

## 2016-02-19 MED ORDER — OMEPRAZOLE 20 MG PO CPDR: 20.0000 mg | DELAYED_RELEASE_CAPSULE | Freq: Every day | ORAL | 11 refills | Status: DC

## 2016-02-19 NOTE — Progress Notes (Signed)
    History of Present Illness: This is a 71 year old male complaining of brief episodes of a "sinking feeling" in his lower chest epigastric area. He is accompanied by his wife. The symptoms have occurred intermittently for the past 6 months and they are brief in duration, lasting several seconds. He denies any pain. The symptoms are not related to exertion, meals, time of day or any particular activity. He does note meals often lead to a bowel movement within 1-4 hours but this has not changed. He underwent ERCP with sphincterotomy and CBD stone/sludge extraction in 06/2015 with bacteremia treat with 10 days of cipro. MRCP and abdominal ultrasound did not reveal cholelithiasis. LFTs returned to normal in 10/2015.   Current Medications, Allergies, Past Medical History, Past Surgical History, Family History and Social History were reviewed in [employer] record.  Physical Exam: General: Well developed, well nourished, no acute distress Head: Normocephalic and atraumatic Eyes:  sclerae anicteric, EOMI Ears: Normal auditory acuity Mouth: No deformity or lesions Lungs: Clear throughout to auscultation Heart: Regular rate and rhythm; no murmurs, rubs or bruits Abdomen: Soft, mild epigastric tenderness and non distended. Colostomy. No masses, hepatosplenomegaly or hernias noted. Normal Bowel sounds Musculoskeletal: Symmetrical with no gross deformities  Pulses:  Normal pulses noted Extremities: No clubbing, cyanosis, edema or deformities noted Neurological: Alert oriented x 4, grossly nonfocal Psychological:  Alert and cooperative. Normal mood and affect  Assessment and Recommendations:  1. Brief episodes of a "sinking feeling" in lower chest/epigastric area. Epigastric tenderness. The cause of his symptoms is not clear however they are mild. CMP, CBC, lipase, TSH today. Trial of omeprazole 20 mg daily along with antireflux measures for possible GERD. Return office visit in 6  weeks. If symptoms are persistent or worsen consider repeat abdominal ultrasound and consider EGD.  2. Personal history of rectal cancer in 2001, status post colostomy. Personal history of adenomatous colon polyps. A 3 year interval surveillance colonoscopy is due in June 2018.

## 2016-02-19 NOTE — Patient Instructions (Signed)
Your physician has requested that you go to the basement for lab work before leaving today.  We have sent the following medications to your pharmacy for you to pick up at your convenience: omeprazole.   Patient advised to avoid spicy, acidic, citrus, chocolate, mints, fruit and fruit juices.  Limit the intake of caffeine, alcohol and Soda.  Don't exercise too soon after eating.  Don't lie down within 3-4 hours of eating.  Elevate the head of your bed.  Thank you for choosing me and [facility].  Pricilla Riffle. Dagoberto Ligas., MD., [facility]al

## 2016-03-04 ENCOUNTER — Other Ambulatory Visit (INDEPENDENT_AMBULATORY_CARE_PROVIDER_SITE_OTHER): Payer: PPO

## 2016-03-04 ENCOUNTER — Telehealth: Payer: Self-pay | Admitting: Gastroenterology

## 2016-03-04 ENCOUNTER — Other Ambulatory Visit: Payer: Self-pay

## 2016-03-04 DIAGNOSIS — R945 Abnormal results of liver function studies: Principal | ICD-10-CM

## 2016-03-04 DIAGNOSIS — R748 Abnormal levels of other serum enzymes: Secondary | ICD-10-CM

## 2016-03-04 DIAGNOSIS — G8929 Other chronic pain: Secondary | ICD-10-CM

## 2016-03-04 DIAGNOSIS — R7989 Other specified abnormal findings of blood chemistry: Secondary | ICD-10-CM | POA: Diagnosis not present

## 2016-03-04 DIAGNOSIS — R1013 Epigastric pain: Secondary | ICD-10-CM

## 2016-03-04 LAB — AMYLASE: AMYLASE: 71 U/L (ref 27–131)

## 2016-03-04 LAB — LIPASE: Lipase: 91 U/L — ABNORMAL HIGH (ref 11.0–59.0)

## 2016-03-04 NOTE — Telephone Encounter (Signed)
See lipase results for additional details.

## 2016-03-22 ENCOUNTER — Ambulatory Visit (INDEPENDENT_AMBULATORY_CARE_PROVIDER_SITE_OTHER)
Admission: RE | Admit: 2016-03-22 | Discharge: 2016-03-22 | Disposition: A | Discharge status: Home / Self Care | Payer: PPO | Source: Ambulatory Visit | Attending: Gastroenterology | Admitting: Gastroenterology

## 2016-03-22 DIAGNOSIS — G8929 Other chronic pain: Secondary | ICD-10-CM

## 2016-03-22 DIAGNOSIS — R1013 Epigastric pain: Secondary | ICD-10-CM | POA: Diagnosis not present

## 2016-03-22 DIAGNOSIS — R748 Abnormal levels of other serum enzymes: Secondary | ICD-10-CM

## 2016-03-22 MED ORDER — IOPAMIDOL (ISOVUE-300) INJECTION 61%
100.0000 mL | Freq: Once | INTRAVENOUS | Status: AC | PRN
  Administered 2016-03-22: 100 mL via INTRAVENOUS

## 2016-03-28 DIAGNOSIS — D485 Neoplasm of uncertain behavior of skin: Secondary | ICD-10-CM | POA: Diagnosis not present

## 2016-03-28 DIAGNOSIS — L821 Other seborrheic keratosis: Secondary | ICD-10-CM | POA: Diagnosis not present

## 2016-04-03 ENCOUNTER — Ambulatory Visit (INDEPENDENT_AMBULATORY_CARE_PROVIDER_SITE_OTHER): Payer: PPO | Admitting: Gastroenterology

## 2016-04-03 ENCOUNTER — Encounter: Payer: Self-pay | Admitting: Gastroenterology

## 2016-04-03 VITALS — BP 120/66 | HR 72 | Ht 69.0 in | Wt 156.8 lb

## 2016-04-03 DIAGNOSIS — R079 Chest pain, unspecified: Secondary | ICD-10-CM | POA: Diagnosis not present

## 2016-04-03 DIAGNOSIS — R1013 Epigastric pain: Secondary | ICD-10-CM

## 2016-04-03 DIAGNOSIS — Z86010 Personal history of colonic polyps: Secondary | ICD-10-CM

## 2016-04-03 NOTE — Patient Instructions (Signed)
Continue your omeprazole 20 mg daily.   You will be due for a recall colonoscopy in 07/2016. We will send you a reminder in the mail when it gets closer to that time.  Thank you for choosing me and [facility].  Pricilla Riffle. Dagoberto Ligas., MD., [facility] Regal

## 2016-04-03 NOTE — Progress Notes (Signed)
    History of Present Illness: This is a 71 year old male with poorly defined lower chest and epigastric discomfort, sinking feeling. He is accompanied by his wife. Lipase was mildly elevated for unclear reasons however other blood work was negative. He feels that symptoms have improved, less frequent. He feels symptoms might be stress related.    Abd/pelvic CT 03/22/2016 IMPRESSION: 1. Pneumobilia throughout the intrahepatic bile ducts, gallbladder and common bile duct, compatible with post ERCP/ sphincterotomy change. Mild intrahepatic biliary ductal dilatation and common bile duct diameter 8 mm, improved compared to the pre-ERCP CT study. No radiopaque cholelithiasis or choledocholithiasis. Stable small periampullary duodenum diverticulum. No biliary mass. 2. No pancreatic mass. No pancreatic duct dilation. No evidence of acute pancreatitis. 3. No evidence of metastatic disease in the abdomen or pelvis. Previously described mild peripancreatic and portacaval lymphadenopathy has resolved. 4. Stable chronic post treatment changes in the presacral space . 5. No evidence of bowel obstruction or acute bowel inflammation. 6. Additional findings include aortic atherosclerosis, coronary atherosclerosis, nonobstructing left nephrolithiasis and mildly enlarged prostate.   Current Medications, Allergies, Past Medical History, Past Surgical History, Family History and Social History were reviewed in [employer] record.  Physical Exam: General: Well developed, well nourished, no acute distress Head: Normocephalic and atraumatic Eyes:  sclerae anicteric, EOMI Ears: Normal auditory acuity Mouth: No deformity or lesions Lungs: Clear throughout to auscultation Heart: Regular rate and rhythm; no murmurs, rubs or bruits Abdomen: Soft, non tender and non distended. No masses, hepatosplenomegaly or hernias noted. Normal Bowel sounds Musculoskeletal: Symmetrical with no gross deformities   Pulses:  Normal pulses noted Extremities: No clubbing, cyanosis, edema or deformities noted Neurological: Alert oriented x 4, grossly nonfocal Psychological:  Alert and cooperative. Normal mood and affect  Assessment and Recommendations:  1. Lower chest/epigastric discomfort. Possible GERD however symptoms are atypical. Continue omeprazole 20 mg daily and antireflux measures. CT findings reviewed with the pt and his wife. REV in 2 months. He is advised to call if symptoms do not remain under good control.  2. Personal history of adenomatous colon polyps. Surveillance colonoscopy due in 07/2016.   I spent 15 minutes of face-to-face time with the patient. Greater than 50% of the time was spent counseling and coordinating care.

## 2016-04-22 DIAGNOSIS — F3342 Major depressive disorder, recurrent, in full remission: Secondary | ICD-10-CM | POA: Diagnosis not present

## 2016-04-23 DIAGNOSIS — Z933 Colostomy status: Secondary | ICD-10-CM | POA: Diagnosis not present

## 2016-05-07 DIAGNOSIS — L710 Perioral dermatitis: Secondary | ICD-10-CM | POA: Diagnosis not present

## 2016-05-07 DIAGNOSIS — L570 Actinic keratosis: Secondary | ICD-10-CM | POA: Diagnosis not present

## 2016-05-20 DIAGNOSIS — F411 Generalized anxiety disorder: Secondary | ICD-10-CM | POA: Diagnosis not present

## 2016-05-20 DIAGNOSIS — M48062 Spinal stenosis, lumbar region with neurogenic claudication: Secondary | ICD-10-CM | POA: Diagnosis not present

## 2016-05-20 DIAGNOSIS — Z85038 Personal history of other malignant neoplasm of large intestine: Secondary | ICD-10-CM | POA: Diagnosis not present

## 2016-05-20 DIAGNOSIS — I10 Essential (primary) hypertension: Secondary | ICD-10-CM | POA: Diagnosis not present

## 2016-05-20 DIAGNOSIS — E7800 Pure hypercholesterolemia, unspecified: Secondary | ICD-10-CM | POA: Diagnosis not present

## 2016-05-20 DIAGNOSIS — E119 Type 2 diabetes mellitus without complications: Secondary | ICD-10-CM | POA: Diagnosis not present

## 2016-05-20 DIAGNOSIS — R251 Tremor, unspecified: Secondary | ICD-10-CM | POA: Diagnosis not present

## 2016-05-20 DIAGNOSIS — F0280 Dementia in other diseases classified elsewhere without behavioral disturbance: Secondary | ICD-10-CM | POA: Diagnosis not present

## 2016-05-20 DIAGNOSIS — F324 Major depressive disorder, single episode, in partial remission: Secondary | ICD-10-CM | POA: Diagnosis not present

## 2016-05-21 ENCOUNTER — Other Ambulatory Visit: Payer: Self-pay | Admitting: Neurology

## 2016-05-28 ENCOUNTER — Encounter: Payer: Self-pay | Admitting: Gastroenterology

## 2016-05-28 DIAGNOSIS — F3342 Major depressive disorder, recurrent, in full remission: Secondary | ICD-10-CM | POA: Diagnosis not present

## 2016-05-28 DIAGNOSIS — J028 Acute pharyngitis due to other specified organisms: Secondary | ICD-10-CM | POA: Diagnosis not present

## 2016-06-10 DIAGNOSIS — Z933 Colostomy status: Secondary | ICD-10-CM | POA: Diagnosis not present

## 2016-06-12 ENCOUNTER — Other Ambulatory Visit (HOSPITAL_COMMUNITY): Payer: Self-pay | Admitting: Psychiatry

## 2016-06-13 ENCOUNTER — Emergency Department (HOSPITAL_COMMUNITY): Payer: PPO

## 2016-06-13 ENCOUNTER — Encounter (HOSPITAL_COMMUNITY): Payer: Self-pay | Admitting: Emergency Medicine

## 2016-06-13 ENCOUNTER — Emergency Department (HOSPITAL_COMMUNITY)
Admission: EM | Admit: 2016-06-13 | Discharge: 2016-06-13 | Disposition: A | Discharge status: Home / Self Care | Payer: PPO | Attending: Emergency Medicine | Admitting: Emergency Medicine

## 2016-06-13 DIAGNOSIS — Z87891 Personal history of nicotine dependence: Secondary | ICD-10-CM | POA: Insufficient documentation

## 2016-06-13 DIAGNOSIS — M25511 Pain in right shoulder: Secondary | ICD-10-CM | POA: Diagnosis not present

## 2016-06-13 DIAGNOSIS — Z85048 Personal history of other malignant neoplasm of rectum, rectosigmoid junction, and anus: Secondary | ICD-10-CM | POA: Diagnosis not present

## 2016-06-13 DIAGNOSIS — Z7982 Long term (current) use of aspirin: Secondary | ICD-10-CM | POA: Insufficient documentation

## 2016-06-13 DIAGNOSIS — Y92007 Garden or yard of unspecified non-institutional (private) residence as the place of occurrence of the external cause: Secondary | ICD-10-CM | POA: Diagnosis not present

## 2016-06-13 DIAGNOSIS — M25512 Pain in left shoulder: Secondary | ICD-10-CM | POA: Diagnosis not present

## 2016-06-13 DIAGNOSIS — W19XXXA Unspecified fall, initial encounter: Secondary | ICD-10-CM

## 2016-06-13 DIAGNOSIS — I10 Essential (primary) hypertension: Secondary | ICD-10-CM | POA: Insufficient documentation

## 2016-06-13 DIAGNOSIS — Z85038 Personal history of other malignant neoplasm of large intestine: Secondary | ICD-10-CM | POA: Insufficient documentation

## 2016-06-13 DIAGNOSIS — S4992XA Unspecified injury of left shoulder and upper arm, initial encounter: Secondary | ICD-10-CM | POA: Diagnosis not present

## 2016-06-13 DIAGNOSIS — R51 Headache: Secondary | ICD-10-CM | POA: Diagnosis not present

## 2016-06-13 DIAGNOSIS — E119 Type 2 diabetes mellitus without complications: Secondary | ICD-10-CM | POA: Diagnosis not present

## 2016-06-13 DIAGNOSIS — S42032A Displaced fracture of lateral end of left clavicle, initial encounter for closed fracture: Secondary | ICD-10-CM | POA: Insufficient documentation

## 2016-06-13 DIAGNOSIS — Y998 Other external cause status: Secondary | ICD-10-CM | POA: Insufficient documentation

## 2016-06-13 DIAGNOSIS — R079 Chest pain, unspecified: Secondary | ICD-10-CM | POA: Diagnosis not present

## 2016-06-13 DIAGNOSIS — W1809XA Striking against other object with subsequent fall, initial encounter: Secondary | ICD-10-CM | POA: Diagnosis not present

## 2016-06-13 DIAGNOSIS — S0003XA Contusion of scalp, initial encounter: Secondary | ICD-10-CM | POA: Insufficient documentation

## 2016-06-13 DIAGNOSIS — S42122A Displaced fracture of acromial process, left shoulder, initial encounter for closed fracture: Secondary | ICD-10-CM | POA: Diagnosis not present

## 2016-06-13 DIAGNOSIS — Y9389 Activity, other specified: Secondary | ICD-10-CM | POA: Insufficient documentation

## 2016-06-13 DIAGNOSIS — Z79899 Other long term (current) drug therapy: Secondary | ICD-10-CM | POA: Diagnosis not present

## 2016-06-13 DIAGNOSIS — S0990XA Unspecified injury of head, initial encounter: Secondary | ICD-10-CM | POA: Diagnosis not present

## 2016-06-13 MED ORDER — ACETAMINOPHEN 325 MG PO TABS
650.0000 mg | ORAL_TABLET | Freq: Once | ORAL | Status: AC
  Administered 2016-06-13: 650 mg via ORAL
  Filled 2016-06-13: qty 2

## 2016-06-13 NOTE — ED Notes (Signed)
Pt got to xray and told staff his right shoulder was the shoulder with pain. Xray going to change orders in computer.

## 2016-06-13 NOTE — ED Notes (Signed)
When moving patient to a room, wife explained that when pt got to xray he was getting an xray done on the left not the right shoulder. She was informed that the xray staff called to get order to change xray to correct shoulder. When placed in room, patients wife then told staff that his left shoulder was hurting and pointed to a knot on this shoulder that she was concerned about.

## 2016-06-13 NOTE — ED Notes (Addendum)
Upon assessment, pt is c/o left shoulder pain rating 5/10.  Pain does increase to 10/10 with movement.  Pt fell while working in yard (onto grass).  Tripped over a brick hitting head and left shoulder.  Denies LOC.  Spoke with radiology and pt pain was in right shoulder and was asked 3 times.  During assessment pt c/o left shoulder pain.

## 2016-06-13 NOTE — ED Triage Notes (Signed)
PT states he was working outdoors at his house and tripped over bricks and fell. PT states he did hit the left back side of his head and left shoulder.

## 2016-06-13 NOTE — ED Provider Notes (Signed)
[facility] DEPT Provider Note   CSN: [id number] Arrival date & time: 06/13/16  1303     History   Chief Complaint Chief Complaint  Patient presents with  . Fall    HPI Ricky Conley is a 71 y.o. male.  HPI  71 year old male presents with pain after a fall. The primary area of pain is in his left shoulder. He has a lot of difficulty moving his shoulder. He was working on a window and when he was stepping backwards he tripped over a brick part and fell backward landing on brick. Hit his left head and left shoulder. He did not lose consciousness but felt very dizzy and like he almost was going to pass out. He has not noted any weakness or numbness or blurry vision. He has also had some right sided neck/thoracic back pain. No significant shoulder pain. When in triage they were going to x-ray his left shoulder but he told his right was also hurting and so they x-rayed his right. However he has full range of motion of his right shoulder. No leg injuries. Took some of his anti-anxiety medicine and Neurontin after the injury. This was at about 10:30 AM.  Past Medical History:  Diagnosis Date  . Anxiety   . Arthritis   . Ascending aortic aneurysm (HCC)    Dr. Cyndia Bent follows- last check 2 yrs.  . Bipolar disorder ([place])    tx Depakote  . Colon cancer [facility]) 2001   Chemotherapy, radiation- no longer seeing oncology  . Depression   . Diabetes mellitus without complication ([place])    Type II " controlled with diet"  . Diet-controlled diabetes mellitus ([place])   . Gallstones, common bile duct   . Hyperlipidemia   . Hypertension   . Impaired hearing    wears hearing aid left ear.  . Neuromuscular disorder ([place])    rectal nerve damage' oversewed" chronic pain tx. Gabapentin.  . Rectal cancer ([place])   . Subcortical vascular dementia     Patient Active Problem List   Diagnosis Date Noted  . 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 Frequent falls 07/05/2015  . Acute encephalopathy  06/29/2015  . Encephalopathy 06/29/2015  . Choledocholithiasis   . Dilated bile duct   . Dementia with behavioral disturbance 06/28/2015  . Generalized anxiety disorder 06/15/2015  . Rectal cancer ([place]) 06/12/2015  . Abnormal CT scan 06/09/2015  . Elevated LFTs 06/09/2015  . Transaminitis 04/20/2015  . Hypotension 04/18/2015  . Weakness generalized 04/18/2015  . Loss of balance 04/18/2015  . Generalized weakness 04/18/2015  . Fever 04/09/2015  . Influenza-like illness 04/09/2015  . Delirium 04/09/2015  . Hypernatremia 02/18/2013  . Altered mental status 02/18/2013  . Bipolar disorder ([place]) 02/18/2013  . 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 Hyperlipidemia 02/18/2013  . Ascending aortic aneurysm ([place]) 08/12/2012    Past Surgical History:  Procedure Laterality Date  . BACK SURGERY  2003  . CARPAL TUNNEL RELEASE Bilateral 2008  . COLECTOMY  2001  . COLONOSCOPY    . COLOSTOMY  2001  . ERCP N/A 06/29/2015   Procedure: ENDOSCOPIC RETROGRADE CHOLANGIOPANCREATOGRAPHY (ERCP);  Surgeon: Ladene Artist, MD;  Location: [facility];  Service: Endoscopy;  Laterality: N/A;  . FRACTURE SURGERY Left 2009   ankle with hardware       Home Medications    Prior to Admission medications   Medication Sig Start Date End Date Taking? Authorizing Provider  amLODipine (NORVASC) 5 MG tablet Take 1 tablet (5 mg  total) by mouth daily. 04/12/15   Velvet Bathe, MD  aspirin 81 MG EC tablet Take 81 mg by mouth every morning.     Historical Provider, MD  atorvastatin (LIPITOR) 20 MG tablet Take 1 tablet by mouth daily. 03/12/16   Historical Provider, MD  buPROPion (WELLBUTRIN XL) 300 MG 24 hr tablet Take 300 mg by mouth every morning.    Historical Provider, MD  cholecalciferol (VITAMIN D) 1000 units tablet Take 1,000 Units by mouth daily.    Historical Provider, MD  clonazePAM (KLONOPIN) 0.5 MG tablet Take 0.5 mg by mouth daily.    Historical Provider, MD  divalproex (DEPAKOTE ER) 500 MG 24 hr tablet  Take 1,500 mg by mouth every morning.    Historical Provider, MD  donepezil (ARICEPT) 10 MG tablet Take 1 Tablet by mouth at bedtime 05/22/16   Eustace Quail Tat, DO  doxycycline (VIBRA-TABS) 100 MG tablet Take 1 tablet by mouth 2 (two) times daily. 12/19/15   Historical Provider, MD  gabapentin (NEURONTIN) 300 MG capsule Take two capsules in the morning, two capsules in the afternoon and one capsule in the evening.    Historical Provider, MD  Hydrocortisone (SLHTDSKAJ-68 EX) Apply 1 application topically daily as needed (Applies to stoma for redness.). Reported on 07/11/2015    Historical Provider, MD  omeprazole (PRILOSEC) 20 MG capsule Take 1 capsule (20 mg total) by mouth daily. 02/19/16   Ladene Artist, MD  propranolol (INDERAL) 20 MG tablet Take 20 mg by mouth daily.    Historical Provider, MD    Family History Family History  Problem Relation Age of Onset  . Stroke Father   . Cancer Maternal Uncle   . Cancer Brother   . Colon cancer Neg Hx     Social History Social History  Substance Use Topics  . Smoking status: Former Smoker    Quit date: 05/30/1970  . Smokeless tobacco: Former Systems developer    Quit date: 05/30/1998     Comment: quit chewing tobacco approx 2012  . Alcohol use No     Allergies   Patient has no known allergies.   Review of Systems Review of Systems  Cardiovascular: Negative for chest pain.  Gastrointestinal: Negative for abdominal pain, nausea and vomiting.  Musculoskeletal: Positive for arthralgias, back pain and neck pain.  Neurological: Negative for syncope and headaches.  All other systems reviewed and are negative.    Physical Exam Updated Vital Signs BP (!) 145/78 (BP Location: Right Arm)   Pulse 60   Temp 97.7 F (36.5 C) (Oral)   Resp 16   Ht 5\' 10"  (1.778 m)   Wt 153 lb (69.4 kg)   SpO2 99%   BMI 21.95 kg/m   Physical Exam  Constitutional: He is oriented to person, place, and time. He appears well-developed and well-nourished.  HENT:  Head:  Normocephalic.    Right Ear: External ear normal.  Left Ear: External ear normal.  Nose: Nose normal.  Eyes: EOM are normal. Pupils are equal, round, and reactive to light. Right eye exhibits no discharge. Left eye exhibits no discharge.  Neck: Normal range of motion. Neck supple. No spinous process tenderness present. Normal range of motion present.    Cardiovascular: Normal rate, regular rhythm and normal heart sounds.   Pulses:      Radial pulses are 2+ on the right side, and 2+ on the left side.  Pulmonary/Chest: Effort normal and breath sounds normal.  Abdominal: Soft. There is no tenderness.  Musculoskeletal:  He exhibits no edema.       Right shoulder: He exhibits normal range of motion and no tenderness.       Left shoulder: He exhibits decreased range of motion and tenderness. He exhibits no deformity.       Left elbow: He exhibits normal range of motion. No tenderness found.       Left upper arm: He exhibits no tenderness.       Arms: Neurological: He is alert and oriented to person, place, and time.  CN 3-12 grossly intact. 5/5 strength in all 4 extremities. Grossly normal sensation.   Skin: Skin is warm and dry.  Nursing note and vitals reviewed.    ED Treatments / Results  Labs (all labs ordered are listed, but only abnormal results are displayed) Labs Reviewed - No data to display  EKG  EKG Interpretation None       Radiology Dg Chest 2 View  Result Date: 06/13/2016 CLINICAL DATA:  Chest pain after fall today. EXAM: CHEST  2 VIEW COMPARISON:  Radiograph of April 18, 2015. FINDINGS: The heart size and mediastinal contours are within normal limits. Both lungs are clear. No pneumothorax or pleural effusion is noted. The visualized skeletal structures are unremarkable. IMPRESSION: No active cardiopulmonary disease. Electronically Signed   By: Marijo Conception, M.D.   On: 06/13/2016 15:56   Dg Shoulder Right  Result Date: 06/13/2016 CLINICAL DATA:  Right shoulder  pain after fall today. EXAM: RIGHT SHOULDER - 2+ VIEW COMPARISON:  None. FINDINGS: There is no evidence of fracture or dislocation. Mild degenerative joint disease of the right acromioclavicular joint is noted. Soft tissues are unremarkable. IMPRESSION: Mild degenerative joint disease of the right acromioclavicular joint. No acute abnormality seen in the right shoulder. Electronically Signed   By: Marijo Conception, M.D.   On: 06/13/2016 13:43   Ct Head Wo Contrast  Result Date: 06/13/2016 CLINICAL DATA:  Pain following fall EXAM: CT HEAD WITHOUT CONTRAST TECHNIQUE: Contiguous axial images were obtained from the base of the skull through the vertex without intravenous contrast. COMPARISON:  April 18, 2015 FINDINGS: Brain: There is moderate diffuse atrophy, stable. There is no intracranial mass, hemorrhage, extra-axial fluid collection, or midline shift. There is mild small vessel disease in the centra semiovale bilaterally, stable. There is no new gray-white compartment lesion. No acute infarct evident. Vascular: No hyperdense vessel. There is no appreciable vascular calcification. Skull: Bony calvarium appears intact. Sinuses/Orbits: There is mucosal thickening in several ethmoid air cells bilaterally. There is opacification in the posterior leftward ethmoid air cell. Other visualized paranasal sinuses are clear. Note that the frontal sinuses are nearly aplastic. Orbits appear symmetric bilaterally. Other: Mastoid air cells are clear. There is a stable tubular appearing structure in the left external auditory canal, stable. IMPRESSION: Atrophy with mild periventricular small vessel disease, stable. No intracranial mass, hemorrhage, or extra-axial fluid collection. No acute infarct. There are areas of ethmoid sinus disease. Stable tubular appearing structure in the left external auditory canal. Electronically Signed   By: Lowella Grip III M.D.   On: 06/13/2016 16:01   Dg Shoulder Left  Result Date:  06/13/2016 CLINICAL DATA:  Left shoulder pain after fall. EXAM: LEFT SHOULDER - 2+ VIEW COMPARISON:  None. FINDINGS: Mildly displaced fracture is seen involving the distal left clavicle. There is no evidence of arthropathy. Mild spurring of the acromion is noted. Soft tissues are unremarkable. IMPRESSION: Mild acromial spurring. Mildly displaced distal left clavicular fracture. Electronically Signed  By: Marijo Conception, M.D.   On: 06/13/2016 15:59    Procedures Procedures (including critical care time)  Medications Ordered in ED Medications  acetaminophen (TYLENOL) tablet 650 mg (650 mg Oral Given 06/13/16 1528)     Initial Impression / Assessment and Plan / ED Course  I have reviewed the triage vital signs and the nursing notes.  Pertinent labs & imaging results that were available during my care of the patient were reviewed by me and considered in my medical decision making (see chart for details).     Workup shows minimally displaced left clavicle fracture. This is consistent with where his pain is mostly centered at. Neurovascular intact. We'll place in sling. He does not want any pain meds here or anything to be discharged home on. Follow-up with orthopedics as an outpatient. No other signs of significant injury.  Final Clinical Impressions(s) / ED Diagnoses   Final diagnoses:  Fall, initial encounter  Closed displaced fracture of acromial end of left clavicle, initial encounter  Contusion of scalp, initial encounter    New Prescriptions Discharge Medication List as of 06/13/2016  4:38 PM       Sherwood Gambler, MD 06/13/16 2140

## 2016-06-24 ENCOUNTER — Ambulatory Visit (INDEPENDENT_AMBULATORY_CARE_PROVIDER_SITE_OTHER): Payer: PPO | Admitting: Orthopedic Surgery

## 2016-06-24 ENCOUNTER — Encounter: Payer: Self-pay | Admitting: Gastroenterology

## 2016-06-24 ENCOUNTER — Encounter: Payer: Self-pay | Admitting: Orthopedic Surgery

## 2016-06-24 VITALS — BP 141/81 | HR 57 | Ht 69.75 in | Wt 159.0 lb

## 2016-06-24 DIAGNOSIS — S42035A Nondisplaced fracture of lateral end of left clavicle, initial encounter for closed fracture: Secondary | ICD-10-CM

## 2016-06-24 NOTE — Progress Notes (Signed)
NEW PATIENT OFFICE VISIT    Chief Complaint  Patient presents with  . New Patient (Initial Visit)    Fx Clavicle DOB 06/13/16    71 year old male fell over a stack of bricks landed on his left shoulder. He is right-hand dominant. He was injured on May 3 displaced in a shoulder immobilizer. Complains of mild discomfort over the left shoulder distal clavicle upper arm dull constant pain worse with pressure on the left side are trying to lift his arm.    Review of Systems  Constitutional: Negative for chills and fever.  Gastrointestinal:       Colostomy  Musculoskeletal: Positive for joint pain.  Skin: Negative for rash.  Neurological: Negative for tingling.     Past Medical History:  Diagnosis Date  . Anxiety   . Arthritis   . Ascending aortic aneurysm (HCC)    Dr. Cyndia Bent follows- last check 2 yrs.  . Bipolar disorder ([place])    tx Depakote  . Colon cancer [facility]) 2001   Chemotherapy, radiation- no longer seeing oncology  . Depression   . Diabetes mellitus without complication ([place])    Type II " controlled with diet"  . Diet-controlled diabetes mellitus ([place])   . Gallstones, common bile duct   . Hyperlipidemia   . Hypertension   . Impaired hearing    wears hearing aid left ear.  . Neuromuscular disorder ([place])    rectal nerve damage' oversewed" chronic pain tx. Gabapentin.  . Rectal cancer ([place])   . Subcortical vascular dementia     Past Surgical History:  Procedure Laterality Date  . BACK SURGERY  2003  . CARPAL TUNNEL RELEASE Bilateral 2008  . COLECTOMY  2001  . COLONOSCOPY    . COLOSTOMY  2001  . ERCP N/A 06/29/2015   Procedure: ENDOSCOPIC RETROGRADE CHOLANGIOPANCREATOGRAPHY (ERCP);  Surgeon: Ladene Artist, MD;  Location: [facility];  Service: Endoscopy;  Laterality: N/A;  . FRACTURE SURGERY Left 2009   ankle with hardware    Family History  Problem Relation Age of Onset  . Stroke Father   . Cancer Maternal Uncle   . Cancer Brother   . Colon cancer Neg  Hx    Social History  Substance Use Topics  . Smoking status: Former Smoker    Quit date: 05/30/1970  . Smokeless tobacco: Former Systems developer    Quit date: 05/30/1998     Comment: quit chewing tobacco approx 2012  . Alcohol use No    BP (!) 141/81   Pulse (!) 57   Ht 5' 9.75" (1.772 m)   Wt 159 lb (72.1 kg)   BMI 22.98 kg/m   Physical Exam  Constitutional: He appears well-developed and well-nourished.  Vital signs have been reviewed and are stable. Gen. appearance the patient is well-developed and well-nourished with normal grooming and hygiene. The patient is oriented 3 with normal mood and affect.  Vitals reviewed.   Ortho Exam His gait pattern remains normal. On the right shoulder inspection reveals no pain tenderness or swelling, has full range of motion with normal stability and strength neurovascular exam is intact skin is normal  On the left shoulder slight prominence of the distal clavicle with tenderness the skin is ecchymotic neurovascular exam is intact has normal motor function in his hand has painful range of motion limited range of motion in the left shoulder stability tests were deferred for pain.   No orders of the defined types were placed in this encounter.   Encounter Diagnosis  Name Primary?  . Closed nondisplaced fracture of acromial end of left clavicle, initial encounter Yes     PLAN:   Recommend sling for comfort. No lifting or forward elevation of the left arm  Return in 4 weeks for x-ray left clavicle

## 2016-06-24 NOTE — Patient Instructions (Signed)
Sling as needed

## 2016-07-22 ENCOUNTER — Encounter: Payer: Self-pay | Admitting: Orthopedic Surgery

## 2016-07-22 ENCOUNTER — Ambulatory Visit (INDEPENDENT_AMBULATORY_CARE_PROVIDER_SITE_OTHER): Payer: Self-pay | Admitting: Orthopedic Surgery

## 2016-07-22 ENCOUNTER — Ambulatory Visit (INDEPENDENT_AMBULATORY_CARE_PROVIDER_SITE_OTHER): Payer: PPO

## 2016-07-22 DIAGNOSIS — S42035D Nondisplaced fracture of lateral end of left clavicle, subsequent encounter for fracture with routine healing: Secondary | ICD-10-CM | POA: Diagnosis not present

## 2016-07-22 NOTE — Progress Notes (Signed)
Patient ID: Ricky Conley, male   DOB: [birth year], 71 y.o.   MRN: [id number]  Chief Complaint  Patient presents with  . Follow-up    XRAY LT CLAVICLE FX, DOI 06/13/16    71 years old he fractured his left distal clavicle on May 3 this is week #6  His x-ray shows fracture healing his active flexion is 100 his active abduction is 90      Review of Systems  Constitutional: Negative for chills and fever.  Gastrointestinal:       Colostomy  Musculoskeletal: Positive for joint pain.  Skin: Negative for rash.  Neurological: Negative for tingling.      There were no vitals taken for this visit. Gen. appearance is normal grooming and hygiene normal Orientation to person place and time normal Mood normal   Ortho Exam No gross deformity. He can lift his arm to 90 and 100 on abduction and flexion respectively  Recommend home exercise program follow-up as needed A/P  Medical decision-making  Encounter Diagnosis  Name Primary?  . Closed nondisplaced fracture of acromial end of left clavicle with routine healing, subsequent encounter Yes     No orders of the defined types were placed in this encounter.         Arther Abbott, MD 07/22/2016 9:21 AM

## 2016-07-22 NOTE — Patient Instructions (Addendum)
Return to activities as tolerated Shoulder Range of Motion Exercises Shoulder range of motion (ROM) exercises are designed to keep the shoulder moving freely. They are often recommended for people who have shoulder pain. Phase 1 exercises When you are able, do this exercise 5-6 days per week, or as told by your health care provider. Work toward doing 2 sets of 10 swings. Pendulum Exercise How To Do This Exercise Lying Down 1. Lie face-down on a bed with your abdomen close to the side of the bed. 2. Let your arm hang over the side of the bed. 3. Relax your shoulder, arm, and hand. 4. Slowly and gently swing your arm forward and back. Do not use your neck muscles to swing your arm. They should be relaxed. If you are struggling to swing your arm, have someone gently swing it for you. When you do this exercise for the first time, swing your arm at a 15 degree angle for 15 seconds, or swing your arm 10 times. As pain lessens over time, increase the angle of the swing to 30-45 degrees. 5. Repeat steps 1-4 with the other arm.  How To Do This Exercise While Standing 1. Stand next to a sturdy chair or table and hold on to it with your hand. 1. Bend forward at the waist. 2. Bend your knees slightly. 3. Relax your other arm and let it hang limp. 4. Relax the shoulder blade of the arm that is hanging and let it drop. 5. While keeping your shoulder relaxed, use body motion to swing your arm in small circles. The first time you do this exercise, swing your arm for about 30 seconds or 10 times. When you do it next time, swing your arm for a little longer. 6. Stand up tall and relax. 7. Repeat steps 1-7, this time changing the direction of the circles. 2. Repeat steps 1-8 with the other arm.  Phase 2 exercises Do these exercises 3-4 times per day on 5-6 days per week or as told by your health care provider. Work toward holding the stretch for 20 seconds. Stretching Exercise 1 1. Lift your arm straight out  in front of you. 2. Bend your arm 90 degrees at the elbow (right angle) so your forearm goes across your body and looks like the letter "L." 3. Use your other arm to gently pull the elbow forward and across your body. 4. Repeat steps 1-3 with the other arm. Stretching Exercise 2 You will need a towel or rope for this exercise. 1. Bend one arm behind your back with the palm facing outward. 2. Hold a towel with your other hand. 3. Reach the arm that holds the towel above your head, and bend that arm at the elbow. Your wrist should be behind your neck. 4. Use your free hand to grab the free end of the towel. 5. With the higher hand, gently pull the towel up behind you. 6. With the lower hand, pull the towel down behind you. 7. Repeat steps 1-6 with the other arm.  Phase 3 exercises Do each of these exercises at four different times of day (sessions) every day or as told by your health care provider. To begin with, repeat each exercise 5 times (repetitions). Work toward doing 3 sets of 12 repetitions or as told by your health care provider. Strengthening Exercise 1 You will need a light weight for this activity. As you grow stronger, you may use a heavier weight. 1. Standing with a weight  in your hand, lift your arm straight out to the side until it is at the same height as your shoulder. 2. Bend your arm at 90 degrees so that your fingers are pointing to the ceiling. 3. Slowly raise your hand until your arm is straight up in the air. 4. Repeat steps 1-3 with the other arm.  Strengthening Exercise 2 You will need a light weight for this activity. As you grow stronger, you may use a heavier weight. 1. Standing with a weight in your hand, gradually move your straight arm in an arc, starting at your side, then out in front of you, then straight up over your head. 2. Gradually move your other arm in an arc, starting at your side, then out in front of you, then straight up over your head. 3. Repeat  steps 1-2 with the other arm.  Strengthening Exercise 3 You will need an elastic band for this activity. As you grow stronger, gradually increase the size of the bands or increase the number of bands that you use at one time. 1. While standing, hold an elastic band in one hand and raise that arm up in the air. 2. With your other hand, pull down the band until that hand is by your side. 3. Repeat steps 1-2 with the other arm.  This information is not intended to replace advice given to you by your health care provider. Make sure you discuss any questions you have with your health care provider. Document Released: 10/27/2002 Document Revised: 09/24/2015 Document Reviewed: 01/24/2014 Elsevier Interactive Patient Education  [employer].

## 2016-08-07 DIAGNOSIS — Z933 Colostomy status: Secondary | ICD-10-CM | POA: Diagnosis not present

## 2016-08-13 ENCOUNTER — Ambulatory Visit (AMBULATORY_SURGERY_CENTER): Payer: Self-pay

## 2016-08-13 ENCOUNTER — Emergency Department: Payer: Medicare Other

## 2016-08-13 ENCOUNTER — Emergency Department
Admission: EM | Admit: 2016-08-13 | Discharge: 2016-08-14 | Disposition: A | Discharge status: Home / Self Care | Payer: Medicare Other | Attending: Emergency Medicine | Admitting: Emergency Medicine

## 2016-08-13 VITALS — Ht 70.0 in | Wt 166.6 lb

## 2016-08-13 DIAGNOSIS — W1830XA Fall on same level, unspecified, initial encounter: Secondary | ICD-10-CM

## 2016-08-13 DIAGNOSIS — Z794 Long term (current) use of insulin: Secondary | ICD-10-CM | POA: Insufficient documentation

## 2016-08-13 DIAGNOSIS — S0990XA Unspecified injury of head, initial encounter: Secondary | ICD-10-CM | POA: Insufficient documentation

## 2016-08-13 DIAGNOSIS — S51012A Laceration without foreign body of left elbow, initial encounter: Secondary | ICD-10-CM | POA: Insufficient documentation

## 2016-08-13 DIAGNOSIS — E119 Type 2 diabetes mellitus without complications: Secondary | ICD-10-CM | POA: Insufficient documentation

## 2016-08-13 DIAGNOSIS — Y9301 Activity, walking, marching and hiking: Secondary | ICD-10-CM | POA: Insufficient documentation

## 2016-08-13 DIAGNOSIS — S0003XA Contusion of scalp, initial encounter: Secondary | ICD-10-CM | POA: Insufficient documentation

## 2016-08-13 DIAGNOSIS — W01198A Fall on same level from slipping, tripping and stumbling with subsequent striking against other object, initial encounter: Secondary | ICD-10-CM | POA: Insufficient documentation

## 2016-08-13 DIAGNOSIS — S0101XA Laceration without foreign body of scalp, initial encounter: Secondary | ICD-10-CM | POA: Insufficient documentation

## 2016-08-13 DIAGNOSIS — C189 Malignant neoplasm of colon, unspecified: Secondary | ICD-10-CM

## 2016-08-13 MED ORDER — SUPREP BOWEL PREP KIT 17.5-3.13-1.6 GM/177ML PO SOLN: 1.0000 | Freq: Once | ORAL | 0 refills | Status: AC

## 2016-08-13 NOTE — ED Notes (Signed)
Bed: E57-A  Expected date:   Expected time:   Means of arrival:   Comments:  [facility] ETA 2225

## 2016-08-13 NOTE — Progress Notes (Signed)
No allergies to eggs or soy No past problems with anesthesia Except chills after general anesthesia(following gallstones) No diet meds No home oxygen ' Declined emmi

## 2016-08-13 NOTE — ED Provider Notes (Signed)
[facility]  EMERGENCY DEPARTMENT  History and Physical Exam       Patient Name: Henry, Hines  Encounter Date:  08/13/2016  Physician Assistant: Boykin Peek, PA-C  Attending Physician: Joesphine Bare, MD  PCP: Anise Salvo, MD  Patient DOB:  [birth year]  MRN:  [id number]  Room:  E57/E57-A    History of Presenting Illness     Chief complaint: Fall and Head Laceration    HPI/ROS given by: Patient    Henry Hines is a 71 y.o. male who presents with head injury.  He was walking and tripped, falling and hitting his left temporal area on cement ground.  He denies LOC or amnesia.  He had extensive hemorrhaging at the scene, uncontrolled upon arrival here.  He denies headache, neck pain, back pain, dizziness, nausea, or any other complaint.  He takes aspirin but otherwise denies any anticoagulants.       Review of Systems     Review of Systems   Constitutional: Negative for chills and fever.   Respiratory: Negative for shortness of breath.    Cardiovascular: Negative for chest pain.   Gastrointestinal: Negative for abdominal pain, nausea and vomiting.   Musculoskeletal: Negative for neck pain.   Neurological: Negative for dizziness, speech difficulty and headaches.   Hematological: Does not bruise/bleed easily.   Psychiatric/Behavioral: Negative for agitation.   All other systems reviewed and are negative.       Allergies & Medications     Pt is allergic to percocet [oxycodone-acetaminophen].    Current/Home Medications    ASPIRIN EC 81 MG EC TABLET    Take 81 mg by mouth every evening.        INSULIN DETEMIR (LEVEMIR) 100 UNIT/ML INJECTION    Inject 8 Units into the skin nightly.    INSULIN LISPRO (HUMALOG) 100 UNIT/ML INJECTION    Inject 5 Units into the skin 2 (two) times daily before meals.5 units at lunch and dinner    LISINOPRIL (PRINIVIL,ZESTRIL) 2.5 MG TABLET    Take 2.5 mg by mouth every evening.    METFORMIN (GLUCOPHAGE) 500 MG TABLET    Take 1,000 mg by mouth 2 (two) times daily with  meals.Take two tablets (1,000 mg total) twice a day        OMEPRAZOLE (PRILOSEC) 20 MG CAPSULE    Take 20 mg by mouth every evening.    PRAVASTATIN (PRAVACHOL) 80 MG TABLET    Take 80 mg by mouth every evening.    RANITIDINE (ZANTAC) 75 MG TABLET    Take 75 mg by mouth every morning.        Past Medical History     Pt has a past medical history of Diabetes mellitus; GERD (gastroesophageal reflux disease); Hyperlipidemia; Hypertension; Prostate cancer; and Type 2 diabetes mellitus, controlled.     Past Surgical History     Pt  has a past surgical history that includes EGD (03/23/2013); LAPAROSCOPIC, CHOLECYSTECTOMY, CHOLANGIOGRAM (03/24/2013); Appendectomy; Cholecystectomy; Hernia repair; Transurethral resection of prostate (2012); and EGD, COLONOSCOPY (N/A, 12/20/2015).     Family History     The family history is not on file.     Social History     Pt reports that he has quit smoking. He has never used smokeless tobacco. He reports that he does not drink alcohol or use drugs.     Physical Exam     Blood pressure 133/79, pulse 85, temperature 98.1 F (36.7 C), temperature source Oral, resp. rate 18,  height 1.829 m, weight 81.6 kg, SpO2 100 %.    Constitutional: Well developed, well nourished, active, in no apparent distress.  HENT:   Head: Normocephalic, 3 centimeter left temporal laceration with associated hematoma and profuse venous bleeding.  Ears: No external lesions.  No hemotympanum.  No otorrhea.  Nose: No external lesions. No epistaxis or drainage.  Eyes: PERRL. No scleral icterus. No conjunctival injection. EOMI.  Neck: Trachea is midline. No JVD. Normal range of motion. No apparent masses.  C-spine nontender.  Cardiovascular: Regular rhythm, S1 normal and S2 normal. No murmur heard.  Pulmonary/Chest: Effort normal. Lungs clear to auscultation bilaterally.   Abdominal: Soft, non-tender, non-distended. No masses.   Genitourinay/Anorectal: Defferred  Musculoskeletal: Normal range of motion of extremities. No  deformity or apparent injury.   Neurological: Pt is alert. Cranial nerves are grossly intact. Moving all extremities without apparent deficit.   Psychiatric: Affect is appropriate. There is no agitation.   Skin: Skin is warm, dry, well perfused. No rash noted.  Skin tear to left posterior elbow and dorsum of left hand.  Left temporal scalp wound as noted above      Diagnostic Results     The results of the diagnostic studies below have been reviewed by myself:    Labs  Results     Procedure Component Value Units Date/Time    Type and Screen [[id number]] Collected:  08/14/16 0105    Specimen:  Blood Updated:  08/14/16 0154     ABO Rh A Positive     AB Screen NEGATIVE    Basic Metabolic Panel [[id number]]  (Abnormal) Collected:  08/14/16 0102    Specimen:  Plasma Updated:  08/14/16 0137     Sodium 139 mMol/L      Potassium 4.4 mMol/L      Chloride 107 mMol/L      CO2 23.3 mMol/L      Calcium 9.7 mg/dL      Glucose 308 (H) mg/dL      Creatinine 6.57 (H) mg/dL      BUN 20 mg/dL      Anion Gap 84.6 mMol/L      BUN/Creatinine Ratio 12.9 Ratio      EGFR 44 (L) mL/min/1.38m2      Osmolality Calc 289 mOsm/kg     CBC [[id number]]  (Abnormal) Collected:  08/14/16 0102    Specimen:  Blood from Blood Updated:  08/14/16 0117     WBC 8.6 K/cmm      RBC 4.36 M/cmm      Hemoglobin 11.2 (L) gm/dL      Hematocrit 32.4 (L) %      MCV 80 fL      MCH 26 (L) pg      MCHC 32 gm/dL      RDW 40.1 %      PLT CT 283 K/cmm      MPV 6.8 fL      NEUTROPHIL % 67.7 %      Lymphocytes 17.8 %      Monocytes 9.5 %      Eosinophils % 4.4 %      Basophils % 0.6 %      Neutrophils Absolute 5.8 K/cmm      Lymphocytes Absolute 1.5 K/cmm      Monocytes Absolute 0.8 K/cmm      Eosinophils Absolute 0.4 K/cmm      BASO Absolute 0.0 K/cmm     Dextrose Stick Glucose [[id number]]  (Abnormal) Collected:  08/14/16 0055  Specimen:  Blood Updated:  08/14/16 0114     Glucose, POCT 246 (H) mg/dL           Radiologic Studies  Ct Head Wo Contrast    Result Date:  08/13/2016  IMPRESSION: 1. No acute intracranial abnormality 2. Left periorbital scalp hematoma. No underlying fracture. Disclaimer: CT sensitivity for detection of acute ischemia is limited, and if there is a high index of suspicion for infarct, MRI with diffusion-weighted imaging should be obtained. ReadingStation:[id number]    Ct Cervical Spine Wo Contrast    Result Date: 08/13/2016  IMPRESSION: No evidence of fracture. ReadingStation:[id number]      EKG:      ED Meds     ED Medication Orders     Start Ordered     Status Ordering Provider    08/14/16 0035 08/14/16 0034  sodium chloride 0.9 % bolus 1,000 mL  Once in ED     Route: Intravenous  Ordered Dose: 1,000 mL     Last MAR action:  New Bag Boykin Peek           ED Course and Medical Decision Making     Old medical records and nursing/triage notes were reviewed by myself    DIAGNOSTIC CONSIDERATIONS    Intracranial hemorrhage, hematoma, laceration    CONSULTS      ED COURSE & MDM    Patient presented with head injury after fall.  CT head and C-spine negative.  Initially with with severe bleeding, controlled with several figure-of-eight sutures and skin closure.  He was monitored for several hours following and there was no expanding hematoma or further external bleeding.  Mildly anemic, appears to be baseline.  Hemodynamically stable.  He felt well.  Denied feeling dizzy or lightheaded.  Denied headache, nausea/vomiting.  Seems to be low risk for delayed intracranial bleeding.  His wife was present and will monitor him.  He understands to return here for any confusion, worsening headache, nausea/vomiting, or any other concern.  Otherwise, he will return in 1 week for suture removal.    In addition to the above history, please see nursing notes. Allergies, meds, past medical, family, social hx, and the results of the diagnostic studies performed have been reviewed by myself.    This chart was generated by an EMR and may contain errors or omissions not  intended by the user.     Procedures / Critical Care     LACERATION REPAIR   By Boykin Peek, PA  2:14 AM    Location: L temporal scalp  Length: 3 cm  Layers: double  Contamination: clean  Suture(s): #5-0 nylon and #4-0 vicryl    Verbal consent.  Patient identified and location of wound verified.  Tetanus is up-to-date.  Appropriate sterile precautions observed with saline, gloves and sterile drape.  Lidocaine 1% with epi given locally.  Wound and surrounding structures checked and no further trauma found.  Patient tolerated procedure well with no apparent complications.     Diagnosis / Disposition     Clinical Impression  1. Closed head injury, initial encounter    2. Hematoma of scalp, initial encounter    3. Fall from ground level    4. Skin tear of elbow without complication, left, initial encounter    5.    Laceration repair (procedure)    Disposition  ED Disposition     ED Disposition Condition Date/Time Comment    Discharge  Wed Aug 14, 2016  1:57 AM Henry Hines  Till discharge to home/self care.    Condition at disposition: Stable          Follow up for Discharged Patients  [facility]  [address]  [place] [address]  [phone]  In 1 week  For suture removal      Prescriptions for Discharged Patients  New Prescriptions    No medications on file               Boykin Peek, [place]  08/14/16 0215       Joesphine Bare, MD  08/20/16 1242

## 2016-08-13 NOTE — ED Notes (Signed)
Bed: C14-A  Expected date:   Expected time:   Means of arrival:   Comments:  [address]

## 2016-08-13 NOTE — ED Triage Notes (Signed)
[place] his head. Pt denies LOC. Pt has a small lac to eyebrow. Per EMS he soaked through 2 abd pads PTA. Pt denies being on thinners. States that he only takes aspirin.

## 2016-08-14 LAB — BASIC METABOLIC PANEL
Anion Gap: 13.1 mMol/L (ref 7.0–18.0)
BUN / Creatinine Ratio: 12.9 Ratio (ref 10.0–30.0)
BUN: 20 mg/dL (ref 7–22)
CO2: 23.3 mMol/L (ref 20.0–30.0)
Calcium: 9.7 mg/dL (ref 8.5–10.5)
Chloride: 107 mMol/L (ref 98–110)
Creatinine: 1.55 mg/dL — ABNORMAL HIGH (ref 0.80–1.30)
EGFR: 44 mL/min/{1.73_m2} — ABNORMAL LOW (ref 60–150)
Glucose: 250 mg/dL — ABNORMAL HIGH (ref 71–99)
Osmolality Calc: 289 mOsm/kg (ref 275–300)
Potassium: 4.4 mMol/L (ref 3.5–5.3)
Sodium: 139 mMol/L (ref 136–147)

## 2016-08-14 LAB — CBC AND DIFFERENTIAL
Basophils %: 0.6 % (ref 0.0–3.0)
Basophils Absolute: 0 10*3/uL (ref 0.0–0.3)
Eosinophils %: 4.4 % (ref 0.0–7.0)
Eosinophils Absolute: 0.4 10*3/uL (ref 0.0–0.8)
Hematocrit: 35 % — ABNORMAL LOW (ref 39.0–52.5)
Hemoglobin: 11.2 gm/dL — ABNORMAL LOW (ref 13.0–17.5)
Lymphocytes Absolute: 1.5 10*3/uL (ref 0.6–5.1)
Lymphocytes: 17.8 % (ref 15.0–46.0)
MCH: 26 pg — ABNORMAL LOW (ref 28–35)
MCHC: 32 gm/dL (ref 32–36)
MCV: 80 fL (ref 80–100)
MPV: 6.8 fL (ref 6.0–10.0)
Monocytes Absolute: 0.8 10*3/uL (ref 0.1–1.7)
Monocytes: 9.5 % (ref 3.0–15.0)
Neutrophils %: 67.7 % (ref 42.0–78.0)
Neutrophils Absolute: 5.8 10*3/uL (ref 1.7–8.6)
PLT CT: 283 10*3/uL (ref 130–440)
RBC: 4.36 10*6/uL (ref 4.00–5.70)
RDW: 13.9 % (ref 11.0–14.0)
WBC: 8.6 10*3/uL (ref 4.0–11.0)

## 2016-08-14 LAB — TYPE AND SCREEN
AB Screen: NEGATIVE
ABO Rh: A POS

## 2016-08-14 LAB — VH DEXTROSE STICK GLUCOSE: Glucose POCT: 246 mg/dL — ABNORMAL HIGH (ref 71–99)

## 2016-08-14 MED ORDER — LIDOCAINE-EPINEPHRINE 2 %-1:200000 IJ SOLN
INTRAMUSCULAR | Status: AC
Start: 2016-08-14 — End: ?
  Filled 2016-08-14: qty 20

## 2016-08-14 MED ORDER — SODIUM CHLORIDE 0.9 % IV BOLUS
1000.0000 mL | Freq: Once | INTRAVENOUS | Status: AC
Start: 2016-08-14 — End: 2016-08-14
  Administered 2016-08-14: 1000 mL via INTRAVENOUS

## 2016-08-14 NOTE — ED Notes (Signed)
PA at bedside to suture.

## 2016-08-19 DIAGNOSIS — Z933 Colostomy status: Secondary | ICD-10-CM | POA: Diagnosis not present

## 2016-08-26 DIAGNOSIS — F3342 Major depressive disorder, recurrent, in full remission: Secondary | ICD-10-CM | POA: Diagnosis not present

## 2016-08-29 ENCOUNTER — Encounter: Payer: Self-pay | Admitting: Gastroenterology

## 2016-08-29 ENCOUNTER — Ambulatory Visit (AMBULATORY_SURGERY_CENTER): Payer: PPO | Admitting: Gastroenterology

## 2016-08-29 VITALS — BP 133/72 | HR 65 | Temp 98.6°F | Resp 18 | Ht 70.0 in | Wt 166.0 lb

## 2016-08-29 DIAGNOSIS — Z85048 Personal history of other malignant neoplasm of rectum, rectosigmoid junction, and anus: Secondary | ICD-10-CM

## 2016-08-29 DIAGNOSIS — D120 Benign neoplasm of cecum: Secondary | ICD-10-CM

## 2016-08-29 DIAGNOSIS — F329 Major depressive disorder, single episode, unspecified: Secondary | ICD-10-CM | POA: Diagnosis not present

## 2016-08-29 DIAGNOSIS — Z86010 Personal history of colonic polyps: Secondary | ICD-10-CM | POA: Diagnosis not present

## 2016-08-29 DIAGNOSIS — F0390 Unspecified dementia without behavioral disturbance: Secondary | ICD-10-CM | POA: Diagnosis not present

## 2016-08-29 DIAGNOSIS — D123 Benign neoplasm of transverse colon: Secondary | ICD-10-CM

## 2016-08-29 DIAGNOSIS — K219 Gastro-esophageal reflux disease without esophagitis: Secondary | ICD-10-CM | POA: Diagnosis not present

## 2016-08-29 DIAGNOSIS — Z85038 Personal history of other malignant neoplasm of large intestine: Secondary | ICD-10-CM | POA: Diagnosis not present

## 2016-08-29 DIAGNOSIS — I10 Essential (primary) hypertension: Secondary | ICD-10-CM | POA: Diagnosis not present

## 2016-08-29 MED ORDER — SODIUM CHLORIDE 0.9 % IV SOLN: 500.0000 mL | INTRAVENOUS | Status: AC

## 2016-08-29 NOTE — Op Note (Signed)
[place] Patient Name: Ricky Conley Procedure Date: 08/29/2016 7:59 AM MRN: [id number] Endoscopist: Ladene Artist , MD Age: 71 Referring MD:  Date of Birth: [birth year] Gender: Male Account #: [id number] Procedure:                Colonoscopy Indications:              Surveillance: Personal history of adenomatous                            polyps on last colonoscopy 3 years ago, High risk                            colon cancer surveillance: Personal history of                            colon cancer Medicines:                Monitored Anesthesia Care Procedure:                Pre-Anesthesia Assessment:                           - Prior to the procedure, a History and Physical                            was performed, and patient medications and                            allergies were reviewed. The patient's tolerance of                            previous anesthesia was also reviewed. The risks                            and benefits of the procedure and the sedation                            options and risks were discussed with the patient.                            All questions were answered, and informed consent                            was obtained. Prior Anticoagulants: The patient has                            taken no previous anticoagulant or antiplatelet                            agents. ASA Grade Assessment: II - A patient with                            mild systemic disease. After reviewing the risks  and benefits, the patient was deemed in                            satisfactory condition to undergo the procedure.                           After obtaining informed consent, the colonoscope                            was passed under direct vision. Throughout the                            procedure, the patient's blood pressure, pulse, and                            oxygen saturations were monitored continuously. The                      Colonoscope was introduced through the colostomy                            and advanced to the cecum, identified by                            appendiceal orifice and ileocecal valve. The                            colonoscopy was performed without difficulty. The                            patient tolerated the procedure well. The quality                            of the bowel preparation was good. The ileocecal                            valve and the appendiceal orifice were                            photographed. The rectum was surgically absent. Scope In: 8:13:20 AM Scope Out: 8:24:19 AM Scope Withdrawal Time: 0 hours 8 minutes 52 seconds  Total Procedure Duration: 0 hours 10 minutes 59 seconds  Findings:                 A 8 mm polyp was found in the transverse colon. The                            polyp was sessile. The polyp was removed with a                            cold snare. Resection and retrieval were complete.                           A 4 mm polyp was found in the cecum. The polyp was  sessile. The polyp was removed with a cold biopsy                            forceps. Resection and retrieval were complete.                           There was evidence of a prior end sigmoid colostomy                            in the sigmoid colon. This was patent and was                            characterized by healthy appearing mucosa. The                            anastomosis was traversed.                           The exam was otherwise normal throughout the                            examined colon. Complications:            No immediate complications. Estimated Blood Loss:     Estimated blood loss: none. Impression:               - One 8 mm polyp in the transverse colon, removed                            with a cold snare. Resected and retrieved.                           - One 4 mm polyp in the cecum, removed with a cold                             biopsy forceps. Resected and retrieved.                           - Patent end sigmoid colostomy, characterized by                            healthy appearing mucosa. Recommendation:           - Patient has a contact number available for                            emergencies. The signs and symptoms of potential                            delayed complications were discussed with the                            patient. Return to normal activities tomorrow.  Written discharge instructions were provided to the                            patient.                           - Resume previous diet.                           - Continue present medications.                           - Await pathology results.                           - Repeat colonoscopy in 3 years for surveillance. Ladene Artist, MD 08/29/2016 8:29:32 AM This report has been signed electronically.

## 2016-08-29 NOTE — Progress Notes (Signed)
No changes in medical or surgical hx since PV. Pt is very HOH and wife needed to answer some questions

## 2016-08-29 NOTE — Patient Instructions (Signed)
   Information on polyps given to you today  Await pathology results   YOU HAD AN ENDOSCOPIC PROCEDURE TODAY AT [facility]:   Refer to the procedure report that was given to you for any specific questions about what was found during the examination.  If the procedure report does not answer your questions, please call your gastroenterologist to clarify.  If you requested that your care partner not be given the details of your procedure findings, then the procedure report has been included in a sealed envelope for you to review at your convenience later.  YOU SHOULD EXPECT: Some feelings of bloating in the abdomen. Passage of more gas than usual.  Walking can help get rid of the air that was put into your GI tract during the procedure and reduce the bloating. If you had a lower endoscopy (such as a colonoscopy or flexible sigmoidoscopy) you may notice spotting of blood in your stool or on the toilet paper. If you underwent a bowel prep for your procedure, you may not have a normal bowel movement for a few days.  Please Note:  You might notice some irritation and congestion in your nose or some drainage.  This is from the oxygen used during your procedure.  There is no need for concern and it should clear up in a day or so.  SYMPTOMS TO REPORT IMMEDIATELY:   Following lower endoscopy (colonoscopy or flexible sigmoidoscopy):  Excessive amounts of blood in the stool  Significant tenderness or worsening of abdominal pains  Swelling of the abdomen that is new, acute  Fever of 100F or higher    For urgent or emergent issues, a gastroenterologist can be reached at any hour by calling [phone].   DIET:  We do recommend a small meal at first, but then you may proceed to your regular diet.  Drink plenty of fluids but you should avoid alcoholic beverages for 24 hours.  ACTIVITY:  You should plan to take it easy for the rest of today and you should NOT DRIVE or use heavy  machinery until tomorrow (because of the sedation medicines used during the test).    FOLLOW UP: Our staff will call the number listed on your records the next business day following your procedure to check on you and address any questions or concerns that you may have regarding the information given to you following your procedure. If we do not reach you, we will leave a message.  However, if you are feeling well and you are not experiencing any problems, there is no need to return our call.  We will assume that you have returned to your regular daily activities without incident.  If any biopsies were taken you will be contacted by phone or by letter within the next 1-3 weeks.  Please call us at [phone] if you have not heard about the biopsies in 3 weeks.    SIGNATURES/CONFIDENTIALITY: You and/or your care partner have signed paperwork which will be entered into your electronic medical record.  These signatures attest to the fact that that the information above on your After Visit Summary has been reviewed and is understood.  Full responsibility of the confidentiality of this discharge information lies with you and/or your care-partner.

## 2016-08-29 NOTE — Progress Notes (Signed)
Report to PACU, RN, vss, BBS= Clear.  

## 2016-08-29 NOTE — Progress Notes (Signed)
Called to room to assist during endoscopic procedure.  Patient ID and intended procedure confirmed with present staff. Received instructions for my participation in the procedure from the performing physician.  

## 2016-08-30 ENCOUNTER — Telehealth: Payer: Self-pay

## 2016-08-30 NOTE — Telephone Encounter (Signed)
  Follow up Call-  No flowsheet data found.   Patient questions:  Do you have a fever, pain , or abdominal swelling? No. Pain Score  0 *  Have you tolerated food without any problems? Yes.    Have you been able to return to your normal activities? Yes.    Do you have any questions about your discharge instructions: Diet   No. Medications  No. Follow up visit  No.  Do you have questions or concerns about your Care? No.  Actions: * If pain score is 4 or above: No action needed, pain <4.  Called [phone] and spoke with pt.  No problems noted per pt. maw

## 2016-09-05 ENCOUNTER — Encounter: Payer: Self-pay | Admitting: Gastroenterology

## 2016-09-11 ENCOUNTER — Other Ambulatory Visit (HOSPITAL_COMMUNITY): Payer: Self-pay | Admitting: Psychiatry

## 2016-09-18 DIAGNOSIS — L719 Rosacea, unspecified: Secondary | ICD-10-CM | POA: Diagnosis not present

## 2016-09-18 DIAGNOSIS — L570 Actinic keratosis: Secondary | ICD-10-CM | POA: Diagnosis not present

## 2016-09-18 DIAGNOSIS — M71349 Other bursal cyst, unspecified hand: Secondary | ICD-10-CM | POA: Diagnosis not present

## 2016-10-01 ENCOUNTER — Other Ambulatory Visit (HOSPITAL_COMMUNITY): Payer: Self-pay | Admitting: Psychiatry

## 2016-10-07 ENCOUNTER — Other Ambulatory Visit (HOSPITAL_COMMUNITY): Payer: Self-pay | Admitting: Psychiatry

## 2016-10-25 DIAGNOSIS — Z933 Colostomy status: Secondary | ICD-10-CM | POA: Diagnosis not present

## 2016-11-20 DIAGNOSIS — F0280 Dementia in other diseases classified elsewhere without behavioral disturbance: Secondary | ICD-10-CM | POA: Diagnosis not present

## 2016-11-20 DIAGNOSIS — R251 Tremor, unspecified: Secondary | ICD-10-CM | POA: Diagnosis not present

## 2016-11-20 DIAGNOSIS — E7800 Pure hypercholesterolemia, unspecified: Secondary | ICD-10-CM | POA: Diagnosis not present

## 2016-11-20 DIAGNOSIS — I10 Essential (primary) hypertension: Secondary | ICD-10-CM | POA: Diagnosis not present

## 2016-11-20 DIAGNOSIS — F324 Major depressive disorder, single episode, in partial remission: Secondary | ICD-10-CM | POA: Diagnosis not present

## 2016-11-20 DIAGNOSIS — E119 Type 2 diabetes mellitus without complications: Secondary | ICD-10-CM | POA: Diagnosis not present

## 2016-11-20 DIAGNOSIS — Z125 Encounter for screening for malignant neoplasm of prostate: Secondary | ICD-10-CM | POA: Diagnosis not present

## 2016-11-20 DIAGNOSIS — F419 Anxiety disorder, unspecified: Secondary | ICD-10-CM | POA: Diagnosis not present

## 2016-11-20 DIAGNOSIS — Z23 Encounter for immunization: Secondary | ICD-10-CM | POA: Diagnosis not present

## 2016-11-20 DIAGNOSIS — E785 Hyperlipidemia, unspecified: Secondary | ICD-10-CM | POA: Diagnosis not present

## 2016-11-28 ENCOUNTER — Encounter: Payer: Self-pay | Admitting: Family

## 2016-11-28 DIAGNOSIS — Z87891 Personal history of nicotine dependence: Secondary | ICD-10-CM

## 2016-12-09 ENCOUNTER — Encounter: Payer: Self-pay | Admitting: Family

## 2016-12-09 DIAGNOSIS — Z136 Encounter for screening for cardiovascular disorders: Secondary | ICD-10-CM

## 2016-12-23 DIAGNOSIS — Z933 Colostomy status: Secondary | ICD-10-CM | POA: Diagnosis not present

## 2016-12-24 DIAGNOSIS — F3342 Major depressive disorder, recurrent, in full remission: Secondary | ICD-10-CM | POA: Diagnosis not present

## 2017-01-06 NOTE — Progress Notes (Signed)
Ricky Conley was seen today in the movement disorders clinic for neurologic consultation at the request of Shirline Frees, MD.  The consultation is for the evaluation of Parkinsons disease.  The patient is seen today after his recent hospitalizations.  I have reviewed hospital records made available to me.  He is accompanied by his wife who supplements the history.    He was hospitalized from February 26 to March 1 after mental status change, which was ultimately determined due to influenza.  He returned back to the hospital on March 7 to March 9 with increasing weakness.  He ended up seeing neurology and was felt that he possibly had Parkinson's disease or apraxia of gait.  He was given a trial of low dose levodopa (6am/2pm/10pm).  When they followed up with him the following day, he was not able to articulate whether this helped or not, primarily because the patient was a poor historian.   He does state that today that his walking is 200% better and he no longer has to use the walker.  His wife states that prior to his hospitalization he couldn't sit up on the side of the bed because he would fall over but he doesn't do that.  His wife states that about 2 years ago, he would start having trouble getting up if he was exhausted or if he was sick.   He also has a history of bipolar disorder and was on Depakote and clonazepam, which were to be monitored closely as an outpatient as the patient's liver enzymes were high when he was in the hospital.   07/11/15 update:  The patient is following up today, accompanied by his wife who supplements the history.  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  I weaned him off of levodopa.  He is doing well off of this.   He had some lab work.  [name] was 747.  His RPR was negative.  He saw Dr. Richrd Sox for testing on May 9 and follow-up on May 19.  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  She recommended that he discontinue driving.  She told his wife to remove the guns from the home, which was done.  She also recommended that he follow up with Dr. Casimiro Needle and perhaps consider changing his clonazepam to something else.  He is in the process of reducing it.  Pt states "my memory is much better" but his wife does wink at me.  He was on 2 mg tid but has decreased to 1 mg bid.  I did review the records since last visit.  He went to the hospital on May 18 and ended up having an ERCP and 2 stones extracted.  10/11/15 update:  The patient follows up today, accompanied by his wife who supplements the history.  I have reviewed records available since last visit.  The patient is no longer driving.  I did get him started on Aricept last visit and he has worked his way to 10 mg daily.  He is doing well with the medication.  They deny any side effects.  No diarrhea.  No abdominal pain.  Last visit, he had seen Dr. Si Raider and she had recommended that they talk to his psychiatrist about continuing to reduce his clonazepam.  He has done this and  he is all the way down to 0.5 mg daily (was at 6 mg daily at one point in time).  He is really feeling great and feels much more clear cognitively.  His wife agrees, but he wants to start driving again and his wife is not so sure.  He asks me about fasciculations in his legs.  He does not know how long they have been going on.  No weakness.  No difficulty with swallowing.  He has some back pain, but describes it as "tailbone pain" ever since his colon cancer and resection.  01/10/16 update:  The patient follows up today, accompanied by his wife who supplements the history.  The patient is on Aricept for dementia and is tolerating this well.  States that his memory is good.  States that he is going crossword puzzles and word searches.  States that he is doing word association  to help remember things and it has helped.  Wants to drive again and willing to undergo driving eval to do so. He has had no falls.  He did have an EMG done in September, 2017 because of fasciculations, which demonstrated bilateral, mild tomoderate, chronic, S1 radiculopathy.  He had an MRI of the lumbar spine demonstrating chronic lumbar spine degeneration, moderate severe spinal stenosis at L3-L4 and L4-L5, severe left lateral recess stenosis at L5-S1, and moderate or severe neural foraminal stenosis at the left L3, bilateral L4, and bilateral L5 nerve levels.  He was referred for a neurosx opinion.  I called for notes but didn't receive them.  He saw Dr. Trenton Gammon and was referred for injections with Dr. Maryjean Ka.  Was dx with rosacea since our last visit.    01/07/17 update: Patient seen today in follow-up, accompanied by his wife who supplements the history.  Patient is no longer on the aricept.  States that Dr. Casimiro Needle took him off of it.  They think that Dr. Casimiro Needle thought he was the prescriber of that medication and thought that it was for the bipolar.  He was told it wasn't doing him any good.  He was supposed to go to a driving evaluation through [facility], but he never scheduled with this.  He is still driving some.   I did receive records from Dr. Maryjean Ka from last year, dated December 19, 2015.  The note appeared somewhat incomplete from that date, but it did state that he had previously received an epidural injection.  It did not state with the efficacy of those injections were.  The patient states that "his legs are still trembling" but he isn't having back pain.  He was on klonopin, 2 mg tid and last visit was on 1 po q hs last visit.  Wife states that anxiety increased and he is now on 1 in the AM and 2 at night.     ALLERGIES:  No Known Allergies  CURRENT MEDICATIONS:  Outpatient Encounter Medications as of 01/07/2017  Medication Sig  . amLODipine (NORVASC) 5 MG tablet Take 1 tablet (5 mg total)  by mouth daily.  Marland Kitchen aspirin 81 MG EC tablet Take 81 mg by mouth every morning.   Marland Kitchen atorvastatin (LIPITOR) 20 MG tablet Take 1 tablet by mouth daily.  Marland Kitchen buPROPion (WELLBUTRIN XL) 300 MG 24 hr tablet Take 300 mg by mouth every morning.  . cholecalciferol (VITAMIN D) 1000 units tablet Take 1,000 Units by mouth daily.  . clonazePAM (KLONOPIN) 0.5 MG tablet Take 0.5 mg by mouth. One in the morning, two at  night  . doxycycline (VIBRA-TABS) 100 MG tablet Take 1 tablet by mouth 2 (two) times daily.  Marland Kitchen gabapentin (NEURONTIN) 300 MG capsule Take 300 mg by mouth 3 (three) times daily.   Marland Kitchen omeprazole (PRILOSEC) 20 MG capsule Take 1 capsule (20 mg total) by mouth daily.  . propranolol (INDERAL) 20 MG tablet Take 10 mg by mouth 2 (two) times daily.   . [DISCONTINUED] divalproex (DEPAKOTE ER) 500 MG 24 hr tablet Take 1,500 mg by mouth every morning.  . [DISCONTINUED] donepezil (ARICEPT) 10 MG tablet Take 1 Tablet by mouth at bedtime (Patient not taking: Reported on 08/29/2016)  . [DISCONTINUED] Melatonin 10 MG TABS Take by mouth.   Facility-Administered Encounter Medications as of 01/07/2017  Medication  . 0.9 %  sodium chloride infusion    PAST MEDICAL HISTORY:   Past Medical History:  Diagnosis Date  . Anxiety   . Arthritis   . Ascending aortic aneurysm (HCC)    Dr. Cyndia Bent follows- last check 2 yrs.  . Bipolar disorder ([place])    tx Depakote  . Colon cancer [facility]) 2001   Chemotherapy, radiation- no longer seeing oncology  . Depression   . Diabetes mellitus without complication ([place])    Type II " controlled with diet"  . Diet-controlled diabetes mellitus ([place])   . Gallstones, common bile duct   . Hyperlipidemia   . Hypertension   . Impaired hearing    wears hearing aid left ear.  . Neuromuscular disorder ([place])    rectal nerve damage' oversewed" chronic pain tx. Gabapentin.  . Rectal cancer ([place])   . Subcortical vascular dementia     PAST SURGICAL HISTORY:   Past Surgical History:    Procedure Laterality Date  . BACK SURGERY  2003  . CARPAL TUNNEL RELEASE Bilateral 2008  . COLECTOMY  2001  . COLONOSCOPY    . COLOSTOMY  2001  . ERCP N/A 06/29/2015   Procedure: ENDOSCOPIC RETROGRADE CHOLANGIOPANCREATOGRAPHY (ERCP);  Surgeon: Ladene Artist, MD;  Location: [facility];  Service: Endoscopy;  Laterality: N/A;  . FRACTURE SURGERY Left 2009   ankle with hardware    SOCIAL HISTORY:   Social History   Socioeconomic History  . Marital status: Married    Spouse name: [name]  . Number of children: 4  . Years of education: Not on file  . Highest education level: Not on file  Social Needs  . Financial resource strain: Not on file  . Food insecurity - worry: Not on file  . Food insecurity - inability: Not on file  . Transportation needs - medical: Not on file  . Transportation needs - non-medical: Not on file  Occupational History  . Occupation: retired    Comment: Curator  Tobacco Use  . Smoking status: Former Smoker    Last attempt to quit: 05/30/1970    Years since quitting: 46.6  . Smokeless tobacco: Former Systems developer    Quit date: 05/30/1998  . Tobacco comment: quit chewing tobacco approx 2012  Substance and Sexual Activity  . Alcohol use: No    Alcohol/week: 0.0 oz  . Drug use: No  . Sexual activity: Not on file  Other Topics Concern  . Not on file  Social History Narrative   Married to wife, [name]   Four grown children   HOH and frequent falls   Memory deficit and confusion    FAMILY HISTORY:   Family Status  Relation Name Status  . Mother  Deceased  alzheimers, HTN, depression  . Father  Deceased       stroke  . Sister  Alive       x2 - skin cancer  . Brother  Alive       x1 - stroke  . Child  Alive       2 sons, 2 daughters - alive and well  . Mat Uncle  (Not Specified)  . Brother  (Not Specified)  . Neg Hx  (Not Specified)    ROS:  A complete 10 system review of systems was obtained and was unremarkable apart from what is mentioned  above.  PHYSICAL EXAMINATION:    VITALS:   Vitals:   01/07/17 1031  BP: 134/80  Pulse: 84  Weight: 178 lb (80.7 kg)  Height: 5\' 10"  (1.778 m)    GEN:  The patient appears stated age and is in NAD. HEENT:  Normocephalic, atraumatic.  The mucous membranes are moist. The superficial temporal arteries are without ropiness or tenderness. CV:  RRR Lungs:  CTAB Neck/HEME:  There are no carotid bruits bilaterally.  Neurological examination:  Orientation:  Montreal Cognitive Assessment  01/07/2017 01/10/2016 05/04/2015  Visuospatial/ Executive (0/5) 3 2 2   Naming (0/3) 3 2 3   Attention: Read list of digits (0/2) 2 2 2   Attention: Read list of letters (0/1) 1 1 1   Attention: Serial 7 subtraction starting at 100 (0/3) 0 3 1  Language: Repeat phrase (0/2) 2 2 2   Language : Fluency (0/1) 0 0 0  Abstraction (0/2) 2 1 1   Delayed Recall (0/5) 2 1 1   Orientation (0/6) 6 5 5   Total 21 19 18   Adjusted Score (based on education) 22 20 19     Cranial nerves: There is good facial symmetry.  Extraocular muscles are intact. The visual fields are full to confrontational testing. The speech is fluent and clear. Soft palate rises symmetrically and there is no tongue deviation.  No tongue fasciculations.  Hearing is decreased to conversational tone. Sensation: Sensation is intact to light touch throughout. Motor: Strength is 5/5 in the bilateral upper extremities.  Strength is 4+/5 in the bilateral knee flexors, 5/5 in the bilateral knee extensors, 5/5 with ankle and plantar dorsiflexion bilaterally.  There is some wasting of the muscles, including vastus medialis bilaterally and the left suprascapularis.   Shoulder shrug is equal and symmetric.  There is no pronator drift.  There are diffuse fasciculations across both gastrocnemius muscles bilaterally, left more than right.  There are no fasciculations elsewhere.  Movement examination: Tone: There is normal tone in the bilateral upper extremities.  The  tone in the lower extremities is normal.  Abnormal movements: none Coordination:  There is no decremation with RAM's, with any form of RAMS, including alternating supination and pronation of the forearm, hand opening and closing, finger taps, heel taps and toe taps. Gait and Station: The patient has no difficulty arising out of a deep-seated chair without the use of the hands. The patient's stride length is normal with normal stride length.    Lab Results  Component Value Date   [id number] 854 05/04/2015    No results found for: RPR  Lab Results  Component Value Date   TSH 3.76 02/19/2016     ASSESSMENT/PLAN:  1.  Intermittent apraxia of gait  -I saw no evidence of idiopathic Parkinsons disease today.    Pts wife reports that he has had intermittent problems walking for years when he gets sick and I suspect that  this is apraxia of gait that this is probably from dementia.   He is off of levodopa and doing well off of it.  2.  Subcortical dementia  -He saw Dr. Si Raider for testing on May 9 and follow-up on Jun 30, 2015.  Her reports indicate that the patient had a moderate subcortical dementia (not Alzheimer's dementia).  He had stopped driving and guns have been removed from the home.  Unfortunately, he restarted driving again.  He was supposed to have a driving evaluation, but he did not have that done.  He had greatly reduced his medication load since his neurocognitive testing.  His clonazepam went from 2 mg 3 times per day and last visit he was only on 0.5 mg q hs.  He is now on 0.5 mg in AM and 1 mg at night.  I would like to see him reduce this again, although MoCA has improved with time.    I reiterated to him that if he wants to drive, I would like to see him undergo a driving evaluation.  He was willing to repeat neurocognitive testing, which I think will be informative given MoCA findings with time  -his psychiatrist took him off of his aricept.  He had been doing well with that.     3.   Chronic S1 radiculopathy  -confirmed via EMG and MRI lumbar spine.  This is the source of fasciculations.  No pain.  Saw Dr. Trenton Gammon and surgery not recommended  4.  F/u based on above.  Much greater than 50% of this visit was spent in counseling and coordinating care.  Total face to face time:  25 min

## 2017-01-07 ENCOUNTER — Encounter: Payer: Self-pay | Admitting: Neurology

## 2017-01-07 ENCOUNTER — Ambulatory Visit: Payer: PPO | Admitting: Neurology

## 2017-01-07 VITALS — BP 134/80 | HR 84 | Ht 70.0 in | Wt 178.0 lb

## 2017-01-07 DIAGNOSIS — F0391 Unspecified dementia with behavioral disturbance: Secondary | ICD-10-CM | POA: Diagnosis not present

## 2017-01-07 NOTE — Patient Instructions (Signed)
1. You have been referred for a neurocognitive evaluation in our office.   The evaluation takes approximately two hours. The first part of the appointment is a clinical interview with the neuropsychologist (Dr. MaryBeth Bailar). Please bring someone with you to this appointment if possible, as it is helpful for Dr. Bailar to hear from both you and another adult who knows you well. After speaking with Dr. Bailar, you will complete testing with her technician. The testing includes a variety of tasks- mostly question-and-answer, some paper-and-pencil. There is nothing you need to do to prepare for this appointment, but having a good night's sleep prior to the testing, and bringing eyeglasses and hearing aids (if you wear them), is advised.   About a week after the evaluation, you will return to follow up with Dr. Bailar to review the test results. This appointment is about 30 minutes. If you would like a family member to receive this information as well, please bring them to the appointment.   We have to reserve several hours of the neuropsychologist's time and the psychometrician's time for your evaluation appointment. As such, please note that there is a No-Show fee of $100. If you are unable to attend any of your appointments, please contact our office as soon as possible to reschedule.   

## 2017-01-21 ENCOUNTER — Other Ambulatory Visit: Payer: Self-pay | Admitting: Gastroenterology

## 2017-01-23 ENCOUNTER — Ambulatory Visit: Payer: Medicare Other

## 2017-02-20 ENCOUNTER — Ambulatory Visit: Payer: Medicare Other

## 2017-02-24 DIAGNOSIS — H903 Sensorineural hearing loss, bilateral: Secondary | ICD-10-CM | POA: Diagnosis not present

## 2017-02-25 DIAGNOSIS — Z933 Colostomy status: Secondary | ICD-10-CM | POA: Diagnosis not present

## 2017-03-04 DIAGNOSIS — R946 Abnormal results of thyroid function studies: Secondary | ICD-10-CM | POA: Diagnosis not present

## 2017-03-19 DIAGNOSIS — H903 Sensorineural hearing loss, bilateral: Secondary | ICD-10-CM | POA: Diagnosis not present

## 2017-03-21 ENCOUNTER — Ambulatory Visit
Admission: RE | Admit: 2017-03-21 | Discharge: 2017-03-21 | Disposition: A | Discharge status: Home / Self Care | Payer: Medicare Other | Source: Ambulatory Visit | Attending: Urology | Admitting: Urology

## 2017-03-21 DIAGNOSIS — D509 Iron deficiency anemia, unspecified: Secondary | ICD-10-CM | POA: Insufficient documentation

## 2017-03-21 MED ORDER — SODIUM CHLORIDE 0.9 % IV SOLN
100.0000 mg | Freq: Once | INTRAVENOUS | Status: AC
Start: 2017-03-21 — End: 2017-03-21
  Administered 2017-03-21: 10:00:00 100 mg via INTRAVENOUS
  Filled 2017-03-21: qty 8

## 2017-03-21 MED ORDER — SODIUM CHLORIDE 0.9 % IV SOLN
25.0000 mg | Freq: Once | INTRAVENOUS | Status: AC
Start: 2017-03-21 — End: 2017-03-21
  Administered 2017-03-21: 09:00:00 25 mg via INTRAVENOUS
  Filled 2017-03-21: qty 2

## 2017-03-24 DIAGNOSIS — L719 Rosacea, unspecified: Secondary | ICD-10-CM | POA: Diagnosis not present

## 2017-03-24 DIAGNOSIS — L570 Actinic keratosis: Secondary | ICD-10-CM | POA: Diagnosis not present

## 2017-04-01 ENCOUNTER — Ambulatory Visit
Admission: RE | Admit: 2017-04-01 | Discharge: 2017-04-01 | Disposition: A | Discharge status: Home / Self Care | Payer: Medicare Other | Source: Ambulatory Visit

## 2017-04-01 MED ORDER — SODIUM CHLORIDE 0.9 % IV SOLN
125.0000 mg | Freq: Once | INTRAVENOUS | Status: AC
Start: 2017-04-01 — End: 2017-04-01
  Administered 2017-04-01: 09:00:00 125 mg via INTRAVENOUS
  Filled 2017-04-01: qty 10

## 2017-04-08 ENCOUNTER — Ambulatory Visit
Admission: RE | Admit: 2017-04-08 | Discharge: 2017-04-08 | Disposition: A | Discharge status: Home / Self Care | Payer: Medicare Other | Source: Ambulatory Visit

## 2017-04-08 MED ORDER — SODIUM CHLORIDE 0.9 % IV SOLN
125.0000 mg | Freq: Once | INTRAVENOUS | Status: AC
Start: 2017-04-08 — End: 2017-04-08
  Administered 2017-04-08: 09:00:00 125 mg via INTRAVENOUS
  Filled 2017-04-08: qty 10

## 2017-04-14 DIAGNOSIS — E119 Type 2 diabetes mellitus without complications: Secondary | ICD-10-CM | POA: Diagnosis not present

## 2017-04-14 DIAGNOSIS — M48062 Spinal stenosis, lumbar region with neurogenic claudication: Secondary | ICD-10-CM | POA: Diagnosis not present

## 2017-04-14 DIAGNOSIS — F0390 Unspecified dementia without behavioral disturbance: Secondary | ICD-10-CM | POA: Diagnosis not present

## 2017-04-14 DIAGNOSIS — E7800 Pure hypercholesterolemia, unspecified: Secondary | ICD-10-CM | POA: Diagnosis not present

## 2017-04-14 DIAGNOSIS — F324 Major depressive disorder, single episode, in partial remission: Secondary | ICD-10-CM | POA: Diagnosis not present

## 2017-04-14 DIAGNOSIS — I10 Essential (primary) hypertension: Secondary | ICD-10-CM | POA: Diagnosis not present

## 2017-04-15 ENCOUNTER — Ambulatory Visit
Admission: RE | Admit: 2017-04-15 | Discharge: 2017-04-15 | Disposition: A | Discharge status: Home / Self Care | Payer: Medicare Other | Source: Ambulatory Visit | Attending: Urology | Admitting: Urology

## 2017-04-15 DIAGNOSIS — D509 Iron deficiency anemia, unspecified: Secondary | ICD-10-CM | POA: Insufficient documentation

## 2017-04-15 MED ORDER — SODIUM CHLORIDE 0.9 % IV SOLN
125.0000 mg | Freq: Once | INTRAVENOUS | Status: AC
Start: 2017-04-15 — End: 2017-04-15
  Administered 2017-04-15: 09:00:00 125 mg via INTRAVENOUS
  Filled 2017-04-15: qty 10

## 2017-04-15 MED ORDER — ONDANSETRON 4 MG PO TBDP
4.0000 mg | ORAL_TABLET | Freq: Once | ORAL | Status: AC
Start: 2017-04-15 — End: 2017-04-15
  Administered 2017-04-15: 11:00:00 4 mg via ORAL
  Filled 2017-04-15: qty 1

## 2017-04-15 NOTE — Progress Notes (Signed)
Post infusion, patient c/o nausea and abdominal cramping. Patient attempted to go to bathroom, patient stated he had "a lot of diarrhea" but no vomiting. Office contacted, order for 4mg  PO zofran was given verbally. Zofran received from pharmacy and given to patient. Patient stated that he "felt better" and was okay to go home. Order received to give premeds prior to next infusion. Will continue to monitor with infusions.

## 2017-04-21 MED ORDER — SODIUM CHLORIDE 0.9 % IV SOLN
125.0000 mg | Freq: Once | INTRAVENOUS | Status: DC
Start: 2017-04-22 — End: 2017-04-21

## 2017-04-21 MED ORDER — ONDANSETRON 4 MG PO TBDP
4.0000 mg | ORAL_TABLET | Freq: Once | ORAL | Status: DC
Start: 2017-04-22 — End: 2017-04-21

## 2017-04-22 ENCOUNTER — Ambulatory Visit
Admission: RE | Admit: 2017-04-22 | Discharge: 2017-04-22 | Disposition: A | Discharge status: Home / Self Care | Payer: Medicare Other | Source: Ambulatory Visit

## 2017-04-22 MED ORDER — ONDANSETRON 4 MG PO TBDP
4.0000 mg | ORAL_TABLET | Freq: Once | ORAL | Status: AC
Start: 2017-04-22 — End: 2017-04-22
  Administered 2017-04-22: 09:00:00 4 mg via ORAL
  Filled 2017-04-22 (×2): qty 1

## 2017-04-22 MED ORDER — SODIUM CHLORIDE 0.9 % IV SOLN
125.0000 mg | Freq: Once | INTRAVENOUS | Status: AC
Start: 2017-04-22 — End: 2017-04-22
  Administered 2017-04-22: 09:00:00 125 mg via INTRAVENOUS
  Filled 2017-04-22: qty 10

## 2017-04-24 DIAGNOSIS — Z933 Colostomy status: Secondary | ICD-10-CM | POA: Diagnosis not present

## 2017-04-27 ENCOUNTER — Emergency Department: Payer: Medicare Other

## 2017-04-27 ENCOUNTER — Emergency Department
Admission: EM | Admit: 2017-04-27 | Discharge: 2017-04-27 | Disposition: A | Discharge status: Home / Self Care | Payer: Medicare Other | Attending: Emergency Medicine | Admitting: Emergency Medicine

## 2017-04-27 DIAGNOSIS — R531 Weakness: Secondary | ICD-10-CM | POA: Insufficient documentation

## 2017-04-27 DIAGNOSIS — I10 Essential (primary) hypertension: Secondary | ICD-10-CM | POA: Insufficient documentation

## 2017-04-27 DIAGNOSIS — J40 Bronchitis, not specified as acute or chronic: Secondary | ICD-10-CM | POA: Insufficient documentation

## 2017-04-27 DIAGNOSIS — M6281 Muscle weakness (generalized): Secondary | ICD-10-CM | POA: Insufficient documentation

## 2017-04-27 DIAGNOSIS — R0602 Shortness of breath: Secondary | ICD-10-CM | POA: Insufficient documentation

## 2017-04-27 LAB — BASIC METABOLIC PANEL
Anion Gap: 13.7 mMol/L (ref 7.0–18.0)
BUN / Creatinine Ratio: 9.7 Ratio — ABNORMAL LOW (ref 10.0–30.0)
BUN: 15 mg/dL (ref 7–22)
CO2: 30 mMol/L (ref 20.0–30.0)
Calcium: 9.5 mg/dL (ref 8.5–10.5)
Chloride: 97 mMol/L — ABNORMAL LOW (ref 98–110)
Creatinine: 1.55 mg/dL — ABNORMAL HIGH (ref 0.80–1.30)
EGFR: 44 mL/min/{1.73_m2} — ABNORMAL LOW (ref 60–150)
Glucose: 204 mg/dL — ABNORMAL HIGH (ref 71–99)
Osmolality Calc: 281 mOsm/kg (ref 275–300)
Potassium: 3.7 mMol/L (ref 3.5–5.3)
Sodium: 137 mMol/L (ref 136–147)

## 2017-04-27 LAB — CBC AND DIFFERENTIAL
Basophils %: 0 % (ref 0.0–3.0)
Basophils Absolute: 0 10*3/uL (ref 0.0–0.3)
Eosinophils %: 0 % (ref 0.0–7.0)
Eosinophils Absolute: 0 10*3/uL (ref 0.0–0.8)
Hematocrit: 42.3 % (ref 39.0–52.5)
Hemoglobin: 13.1 gm/dL (ref 13.0–17.5)
Lymphocytes Absolute: 0.8 10*3/uL (ref 0.6–5.1)
Lymphocytes: 15.8 % (ref 15.0–46.0)
MCH: 25 pg — ABNORMAL LOW (ref 28–35)
MCHC: 31 gm/dL — ABNORMAL LOW (ref 32–36)
MCV: 81 fL (ref 80–100)
MPV: 7.1 fL (ref 6.0–10.0)
Monocytes Absolute: 0.8 10*3/uL (ref 0.1–1.7)
Monocytes: 14.1 % (ref 3.0–15.0)
Neutrophils %: 70 % (ref 42.0–78.0)
Neutrophils Absolute: 3.8 10*3/uL (ref 1.7–8.6)
PLT CT: 191 10*3/uL (ref 130–440)
RBC: 5.21 10*6/uL (ref 4.00–5.70)
RDW: 18.3 % — ABNORMAL HIGH (ref 11.0–14.0)
WBC: 5.4 10*3/uL (ref 4.0–11.0)

## 2017-04-27 LAB — ECG 12-LEAD
P Wave Axis: -27 deg
P-R Interval: 140 ms
Patient Age: 71 years
Q-T Interval(Corrected): 390 ms
Q-T Interval: 346 ms
QRS Axis: -40 deg
QRS Duration: 87 ms
T Axis: 48 years
Ventricular Rate: 76 //min

## 2017-04-27 IMAGING — CT CT ABD-PEL WO/W CM
3 of 12 series · 11 of 46 positions shown, 16 images · IV contrast (ISOVUE 300)
Comparison: 06/08/2015 CT chest, abdomen and pelvis. 06/13/2015 MRI
abdomen.

CLINICAL DATA: 70-year-old male with chronic epigastric abdominal
pain and elevated lipase. Remote history of colorectal cancer.
Status post ERCP 06/29/2015 for obstructing choledocholithiasis .

EXAM:
CT ABDOMEN AND PELVIS WITHOUT AND WITH CONTRAST
TECHNIQUE: Multidetector CT imaging of the abdomen and pelvis was performed
following the standard protocol before and following the bolus
administration of intravenous contrast.
CONTRAST:  100mL [id number] IOPAMIDOL ([id number]) INJECTION 61%

[Series 7: venous phase 5.0 b30f · axial · portal-venous · 0.72mm/px · z∈[-428,-194]mm · 3 of 95 slices shown, 7 images]
[im 24/95  soft-tissue]
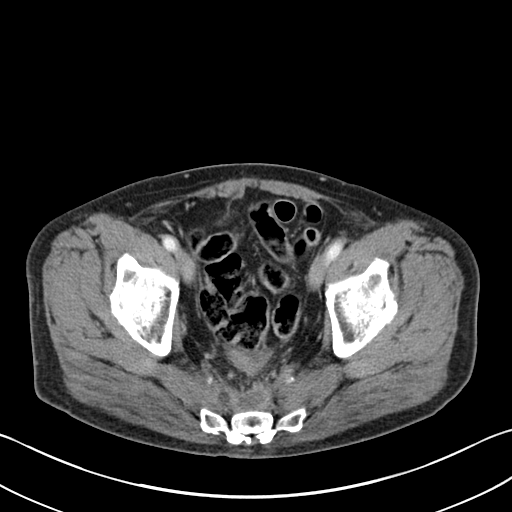
[im 24/95  lung]
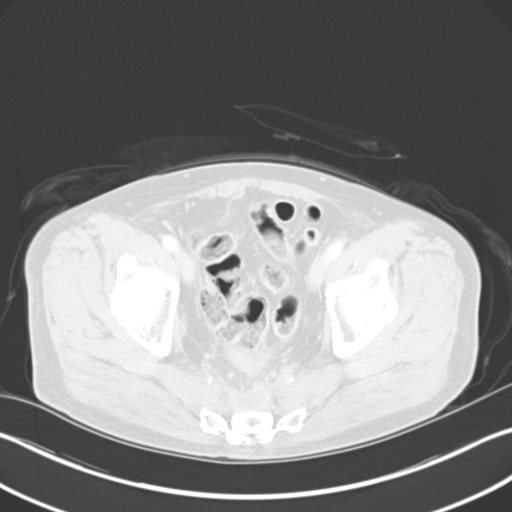
[im 24/95  bone]
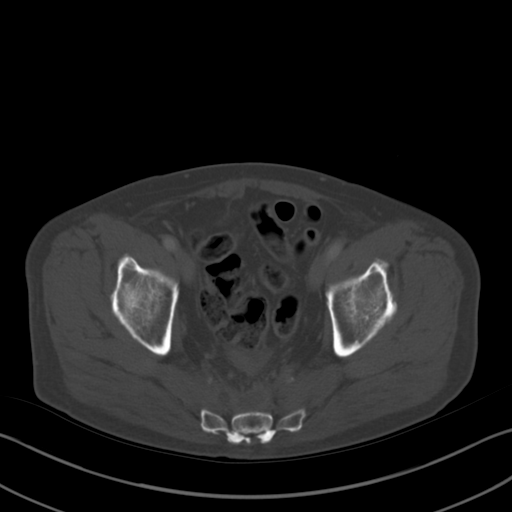
[im 48/95  soft-tissue]
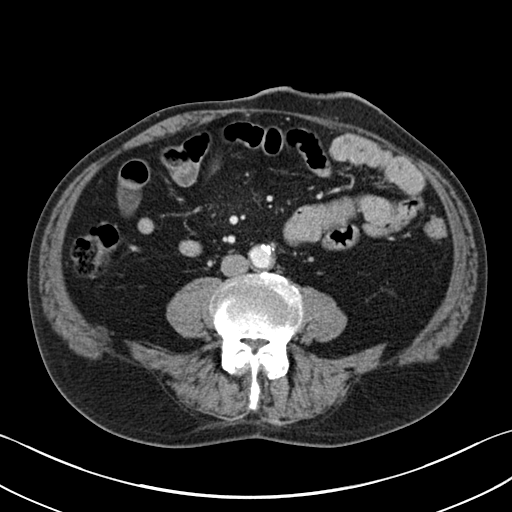
[im 48/95  lung]
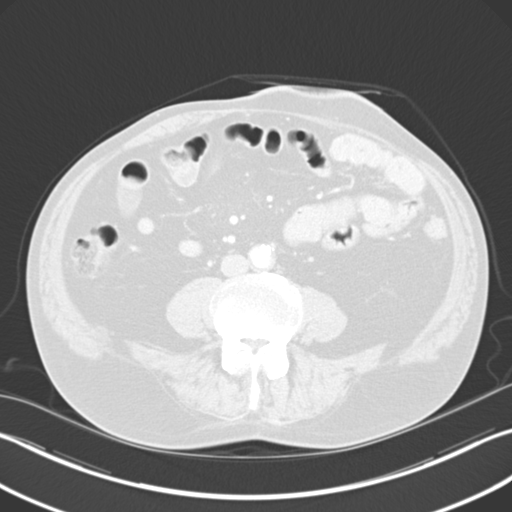
[im 71/95  soft-tissue]
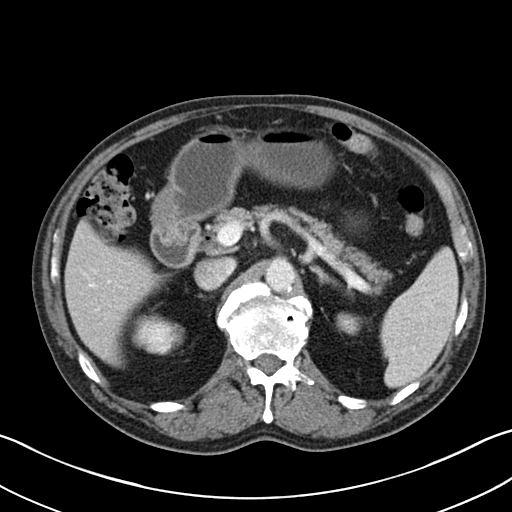
[im 71/95  lung]
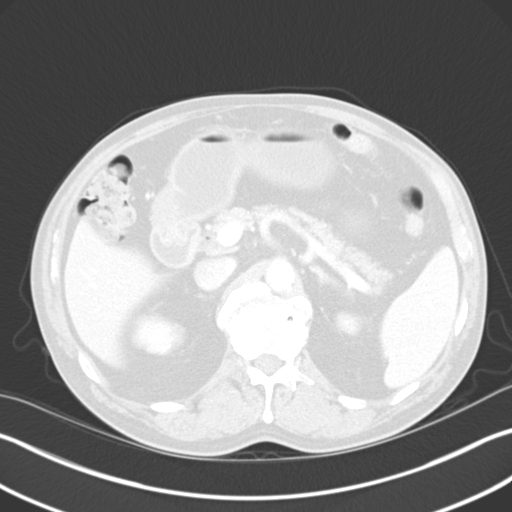

[Series 9: thins · axial · 0.72mm/px · z∈[-502,-264]mm · 5 of 238 slices shown]
[im 24/238  soft-tissue]
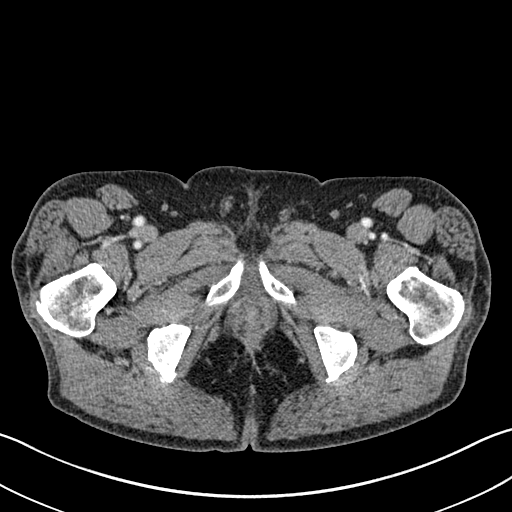
[im 48/238  soft-tissue]
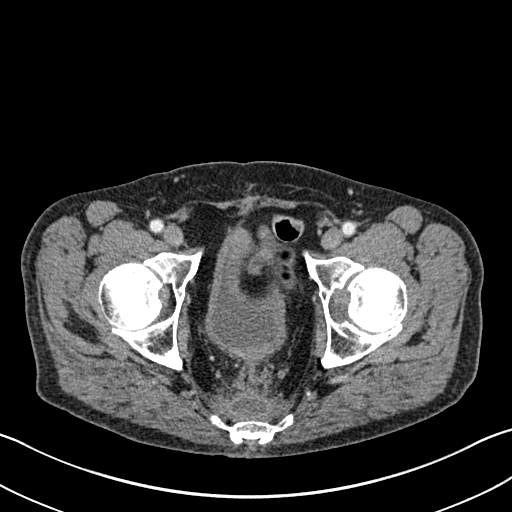
[im 72/238  soft-tissue]
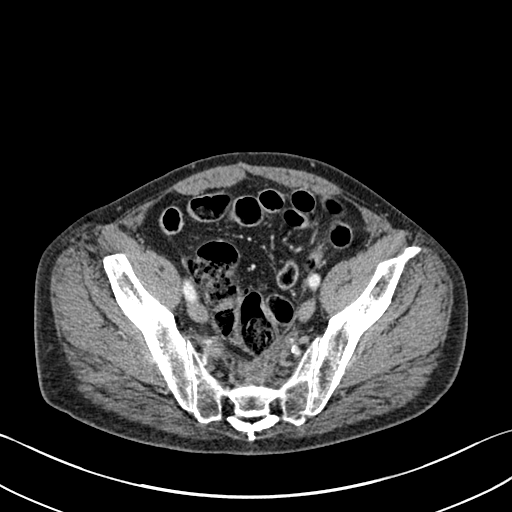
[im 95/238  soft-tissue]
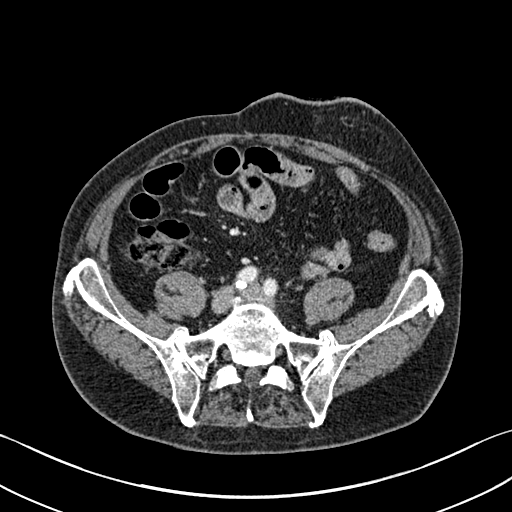
[im 143/238  soft-tissue]
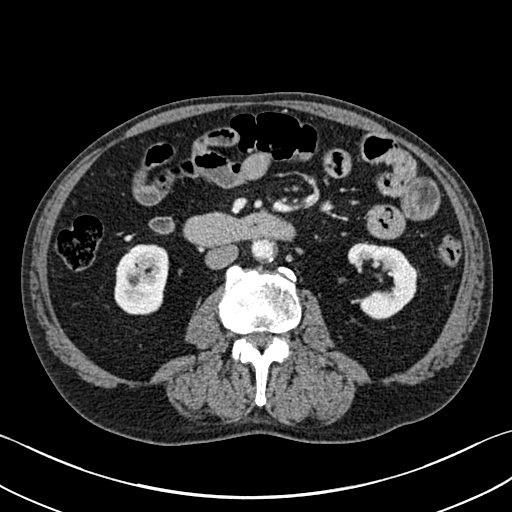

[Series 12: coronal arterial · coronal · arterial · 0.52mm/px · 3 of 84 slices shown, 4 images]
[im 21/84  soft-tissue]
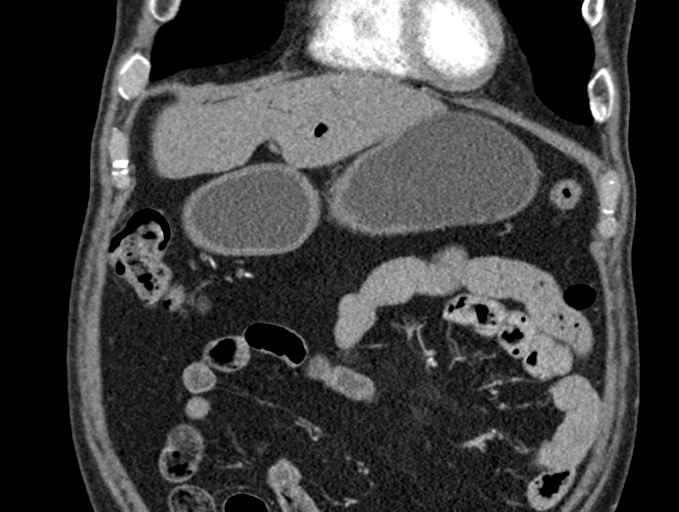
[im 42/84  soft-tissue]
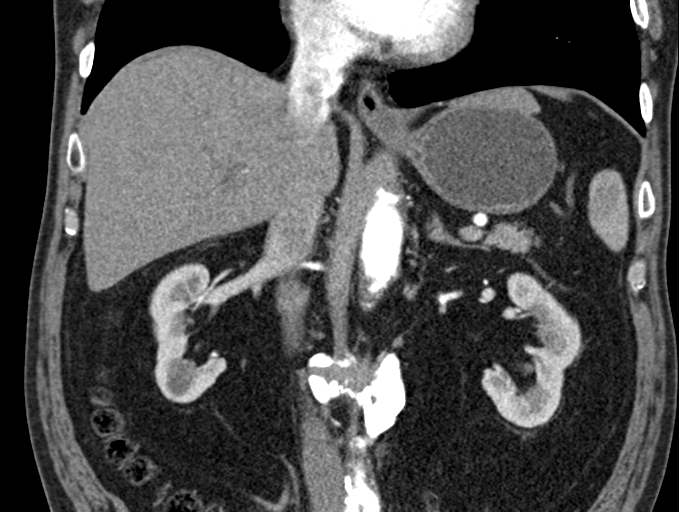
[im 42/84  bone]
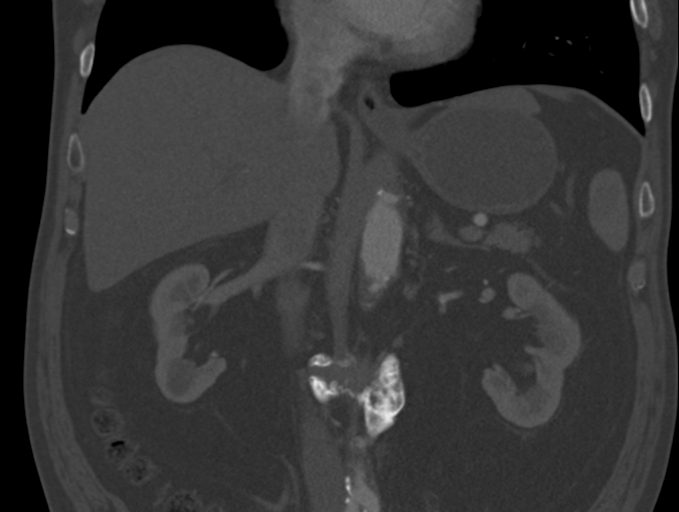
[im 63/84  soft-tissue]
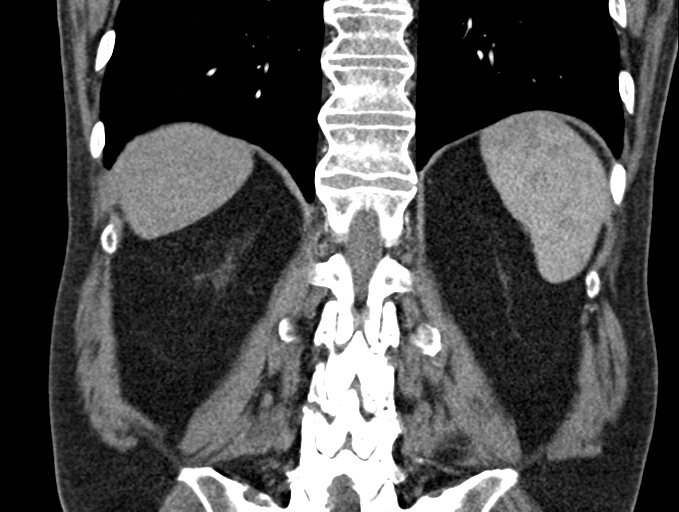

[11 of 46 positions shown; findings below may reference images not displayed]

FINDINGS: Lower chest: No significant pulmonary nodules or acute consolidative
airspace disease. Coronary atherosclerosis.

Hepatobiliary: Normal liver with no liver mass. There is pneumobilia
throughout the intrahepatic bile ducts, common bile duct and
gallbladder. Mild diffuse intrahepatic biliary ductal dilatation,
unchanged. Common bile duct diameter 8 mm, decreased from 12 mm on
06/08/2015. No gallbladder distention, gallbladder wall thickening,
pericholecystic fluid or radiopaque cholelithiasis. No radiopaque
choledocholithiasis. Stable small periampullary duodenal
diverticulum.

Pancreas: No pancreatic mass. No pancreatic duct dilation. No
peripancreatic fluid collections.

Spleen: Normal size. No mass.

Adrenals/Urinary Tract: Normal adrenals. Nonobstructing 2 mm upper
left renal stone. No additional renal stones. No hydronephrosis.
Simple 1.5 cm lower right renal cyst. Additional subcentimeter
hypodense renal cortical lesions in the left kidney are too small to
characterize and not definitely changed. Normal bladder.

Stomach/Bowel: Grossly normal stomach. Normal caliber small bowel
with no small bowel wall thickening. Normal appendix . Stable
postsurgical changes from low anterior resection with ventral left
abdominal wall end colostomy, with no wall thickening or pericolonic
fat stranding in the remnant colon. Stable 2.5 x 1.1 cm presacral
space fluid collection with surrounding soft tissue thickening and
fat stranding (series 7/ image 76) back to at least 06/16/2007 CT
study, compatible with chronic post treatment change.

Vascular/Lymphatic: Atherosclerotic nonaneurysmal abdominal aorta.
Patent portal, splenic, hepatic and renal veins. No pathologically
enlarged lymph nodes in the abdomen or pelvis. Resolved portacaval
and peripancreatic lymphadenopathy. For example the previously
described 1.1 cm peripancreatic node is decreased to 0.5 cm. The
previously described 1.2 cm portacaval node is decreased to 0.5 cm.

Reproductive: Stable mildly enlarged prostate.

Other: No pneumoperitoneum. No ascites. No peritoneal fluid
collections.

Musculoskeletal: No aggressive appearing focal osseous lesions.
Marked thoracolumbar spondylosis.
IMPRESSION: 1. Pneumobilia throughout the intrahepatic bile ducts, gallbladder
and common bile duct, compatible with post ERCP/ sphincterotomy
change. Mild intrahepatic biliary ductal dilatation and common bile
duct diameter 8 mm, improved compared to the pre-ERCP CT study. No
radiopaque cholelithiasis or choledocholithiasis. Stable small
periampullary duodenum diverticulum. No biliary mass.
2. No pancreatic mass. No pancreatic duct dilation. No evidence of
acute pancreatitis.
3. No evidence of metastatic disease in the abdomen or pelvis.
Previously described mild peripancreatic and portacaval
lymphadenopathy has resolved.
4. Stable chronic post treatment changes in the presacral space .
5. No evidence of bowel obstruction or acute bowel inflammation.
6. Additional findings include aortic atherosclerosis, coronary
atherosclerosis, nonobstructing left nephrolithiasis and mildly
enlarged prostate.

## 2017-04-27 MED ORDER — SODIUM CHLORIDE 0.9 % IV BOLUS
1000.0000 mL | Freq: Once | INTRAVENOUS | Status: AC
Start: 2017-04-27 — End: 2017-04-27
  Administered 2017-04-27: 16:00:00 1000 mL via INTRAVENOUS

## 2017-04-27 NOTE — Discharge Instructions (Signed)
Bronchitis, Antibiotic Treatment (Adult)    Bronchitis is an infection of the air passages (bronchial tubes) in your lungs. It often occurs when you have a cold. This illness is contagious during the first few days and is spread through the air by coughing and sneezing, or by direct contact (touching the sick person and then touching your own eyes, nose, or mouth).  Symptoms of bronchitis include cough with mucus (phlegm) and low-grade fever. Bronchitis usually lasts 7 to 14 days. Mild cases can be treated with simple home remedies. More severe infection is treated with an antibiotic.  Home care  Follow these guidelines when caring for yourself at home:   If your symptoms are severe, rest at home for the first 2 to 3 days. When you go back to your usual activities, don't let yourself get too tired.   Don't smoke. Also stay away from secondhand smoke.   You may use over-the-counter medicines to control fever or pain, unless another medicine was prescribed. If you have chronic liver or kidney disease or have ever had a stomach ulcer or gastrointestinal bleeding, talk with your healthcare provider before using these medicines. Also talk to your provider if you are taking medicine to prevent blood clots. Aspirin should never be given to anyone younger than 18 who is ill with a viral infection or fever. It may cause severe liver or brain damage.   Your appetite may be low, so a light diet is fine. Stay well hydrated by drinking 6 to 8 glasses of fluids per day. This includes water, soft drinks, sports drinks, juices, tea, or soup. Extra fluids will help loosen mucus in your nose and lungs.   Over-the-counter cough, cold, and sore-throat medicines will not shorten the length of the illness, but they may be helpful to reduce your symptoms. Don't use decongestants if you have high blood pressure.   Finish all antibiotic medicine. Do this even if you are feeling better after only a few days.  Follow-up care  Follow  up with your healthcare provider, or as advised. If you had an X-ray or ECG (electrocardiogram), a specialist will review it. You will be told of any new test results that may affect your care.  If you are age 65 or older, if you smoke, or if you have a chronic lung disease or condition that affects your immune system, askyour healthcare provider about getting apneumococcal vaccine and a yearly flu shot (influenza vaccine).  When to seek medical advice  Call your healthcare provider right away if any of these occur:   Fever of 100.4F (38C) or higher, or as directed by your healthcare provider   Coughing up more sputum   Weakness, drowsiness, headache, facial pain, ear pain, or a stiff neck    Call 911  Call 911 if any of these occur.   Coughing up blood   Weakness, drowsiness, headache, or stiff neck that get worse   Trouble breathing, wheezing, or pain with breathing   Date Last Reviewed: 07/12/2016   2000-2018 [employer] [address], [place] [address]. All rights reserved. This information is not intended as a substitute for professional medical care. Always follow your healthcare professional's instructions.

## 2017-04-27 NOTE — ED Provider Notes (Signed)
EMERGENCY DEPARTMENT  History and Physical Exam       Patient Name: Henry Hines  Encounter Date:  04/27/2017  Attending Physician: Jane Canary, D.O.  PCP: Anise Salvo, MD  Patient DOB:  Oct 30, 1945  MRN:  [id number]  Room:  S20/S20-A      History of Presenting Illness     Chief complaint: Fall    HPI/ROS is limited by: none  HPI/ROS given by: patient and EMS    Henry Hines is a 72 y.o. male who presents to the ED with complaint of fall. Patient reported he went to [facility] today and was diagnosed with bronchitis. He decided to go to the ME because he had stumbled 3 times yesterday night. States that is very unusual for him. Denies LOC. Patient documents experiencing leg weakness and SOB. Denies chest pain. He also reports a dry cough. Patient documented he had a fever of 101F last night. Reports he is aware of being diagnosed with having a low blood count of 7. He informs he started to use infusions 5 weeks ago after diagnosis. Patient says [facility] informed him he had an iron deficiency and is anemic. As well as his blood count possibly being lower. He reports [facility] performed chest x-ray and an EKG. It is reported [facility] requested for patient to be further evaluated for possible blood transfusion. Patient was advice to not drive. Patient has no other medical concerns at this time.       Review of Systems   Review of Systems   Constitutional: Negative for chills, diaphoresis, fatigue and fever.   HENT: Negative.  Negative for congestion and sore throat.    Eyes: Negative.    Respiratory: Negative for cough, chest tightness and shortness of breath.    Cardiovascular: Negative for chest pain, palpitations and leg swelling.   Gastrointestinal: Negative for abdominal pain, blood in stool, diarrhea, nausea and vomiting.   Endocrine: Negative.    Genitourinary: Negative for dysuria and hematuria.   Musculoskeletal: Negative for back pain.   Skin: Positive for pallor (mild). Negative for rash.    Allergic/Immunologic: Negative.    Neurological: Negative for speech difficulty, weakness, light-headedness and headaches.   Hematological: Negative.    Psychiatric/Behavioral: Negative.    All other systems reviewed and are negative.       Other pertinent review of systems findings in history of present illness.  Allergies & Medications     Pt is allergic to oxycodone.    Current/Home Medications    ASPIRIN EC 81 MG EC TABLET    Take 81 mg by mouth every evening.        INSULIN DETEMIR (LEVEMIR) 100 UNIT/ML INJECTION    Inject 8 Units into the skin nightly.    INSULIN LISPRO (HUMALOG) 100 UNIT/ML INJECTION    Inject 10 Units into the skin 2 (two) times daily before meals.5 units at lunch and dinner         LISINOPRIL (PRINIVIL,ZESTRIL) 2.5 MG TABLET    Take 2.5 mg by mouth every evening.    METFORMIN (GLUCOPHAGE) 500 MG TABLET    Take 1,000 mg by mouth 2 (two) times daily with meals.Take two tablets (1,000 mg total) twice a day        OMEPRAZOLE (PRILOSEC) 20 MG CAPSULE    Take 20 mg by mouth every evening.    PRAVASTATIN (PRAVACHOL) 80 MG TABLET    Take 80 mg by mouth every evening.    RANITIDINE (  ZANTAC) 75 MG TABLET    Take 75 mg by mouth daily as needed.            Past Medical History     Pt   Past Medical History:   Diagnosis Date   . Diabetes mellitus    . GERD (gastroesophageal reflux disease)    . Hyperlipidemia    . Hypertension    . Prostate cancer    . Type 2 diabetes mellitus, controlled         Past Surgical History     Pt   Past Surgical History:   Procedure Laterality Date   . APPENDECTOMY     . CHOLECYSTECTOMY     . EGD  03/23/2013    Procedure: EGD;  Surgeon: Jayme Cloud, MD;  Location: [facility];  Service: Gastroenterology;  Laterality: N/A;   . EGD, COLONOSCOPY N/A 12/20/2015    Procedure: EGD, COLONOSCOPY;  Surgeon: Jayme Cloud, MD;  Location: [facility];  Service: Gastroenterology;  Laterality: N/A;   . HERNIA REPAIR     . LAPAROSCOPIC, CHOLECYSTECTOMY, CHOLANGIOGRAM  03/24/2013     Procedure: LAPAROSCOPIC, CHOLECYSTECTOMY, CHOLANGIOGRAM;  Surgeon: Leory Plowman, MD;  Location: [facility];  Service: General;  Laterality: N/A;  LAP CHOLE    ROOM 516     . TRANSURETHRAL RESECTION OF PROSTATE  [address] [place], [place], [place]        Family History     The family history is not on file.     Social History     Pt reports that he has quit smoking. He has never used smokeless tobacco. He reports that he does not drink alcohol or use drugs.     Physical Exam     Blood pressure 147/74, pulse 71, temperature 99.6 F (37.6 C), temperature source Tympanic, resp. rate 19, height 1.829 m, weight 85 kg, SpO2 97 %.    Vitals signs reviewed.    Physical Exam   Constitutional: He is oriented to person, place, and time. He appears well-developed and well-nourished. No distress.   HENT:   Head: Normocephalic and atraumatic.   Mouth/Throat: Oropharynx is clear and moist.   Eyes: Pupils are equal, round, and reactive to light. Conjunctivae and EOM are normal.   Neck: Normal range of motion. Neck supple.   Cardiovascular: Normal rate, regular rhythm, normal heart sounds and intact distal pulses.    Pulmonary/Chest: Effort normal and breath sounds normal. He has no wheezes. He has no rales.   Abdominal: Soft. Bowel sounds are normal. He exhibits no distension. There is no tenderness. There is no rebound, no guarding and no CVA tenderness.   Musculoskeletal: Normal range of motion. He exhibits no edema or tenderness.   Neurological: He is alert and oriented to person, place, and time. No cranial nerve deficit. He exhibits normal muscle tone.   Skin: Skin is warm and dry. There is pallor (mild).   Psychiatric: He has a normal mood and affect. His behavior is normal.   Nursing note and vitals reviewed.         Diagnostic Results     The results of the diagnostic studies have been reviewed by myself:    Radiologic Studies  Xr Chest 2 Views    Result Date: 04/27/2017  No acute cardiopulmonary  process. ReadingStation:[id number]    Ct Head Wo- Specify Condition/reader    Result Date: 04/27/2017  No acute bleed or infarct is identified. Mild ethmoid  sinus disease. ReadingStation:[id number]      Results     Procedure Component Value Units Date/Time    CBC and differential [[id number]]  (Abnormal) Collected:  04/27/17 1429    Specimen:  Blood from Blood Updated:  04/27/17 1517     WBC 5.4 K/cmm      RBC 5.21 M/cmm      Hemoglobin 13.1 gm/dL      Hematocrit 40.9 %      MCV 81 fL      MCH 25 (L) pg      MCHC 31 (L) gm/dL      RDW 81.1 (H) %      PLT CT 191 K/cmm      MPV 7.1 fL      NEUTROPHIL % 70.0 %      Lymphocytes 15.8 %      Monocytes 14.1 %      Eosinophils % 0.0 %      Basophils % 0.0 %      Neutrophils Absolute 3.8 K/cmm      Lymphocytes Absolute 0.8 K/cmm      Monocytes Absolute 0.8 K/cmm      Eosinophils Absolute 0.0 K/cmm      BASO Absolute 0.0 K/cmm      RBC Morphology Morphology Consistent with Hemogram     Anisocytosis 2+     Elliptocytes 1+     Poikilocytosis 1+     Schistocytes 1+    Basic Metabolic Panel [[id number]]  (Abnormal) Collected:  04/27/17 1429    Specimen:  Plasma Updated:  04/27/17 1502     Sodium 137 mMol/L      Potassium 3.7 mMol/L      Chloride 97 (L) mMol/L      CO2 30.0 mMol/L      Calcium 9.5 mg/dL      Glucose 213 (H) mg/dL      Creatinine 0.86 (H) mg/dL      BUN 15 mg/dL      Anion Gap 57.8 mMol/L      BUN/Creatinine Ratio 9.7 (L) Ratio      EGFR 44 (L) mL/min/1.80m2      Osmolality Calc 281 mOsm/kg         Last EKG Result     Procedure Component Value Units Date/Time    ECG 12 lead (Stat) [[id number]] Collected:  04/27/17 1417     Updated:  04/27/17 1747     Patient Age 10 years      Patient DOB Oct 04, 1945     Patient Height --     Patient Weight --     Interpretation Text --     Sinus rhythm. Rate 76. No acute STT changes.   Atrial premature complex  Left anterior fascicular block  Compared to ECG 09/15/2015 17:09:17  Atrial premature complex(es) now present  Left anterior  fascicular block now present    Electronically Signed On 04-27-2017 17:47:34 EDT by Parke Poisson Wie       Physician Interpreter Parke Poisson [facility]     Ventricular Rate 76 //min      QRS Duration 87 ms      P-R Interval 140 ms      Q-T Interval 346 ms      Q-T Interval(Corrected) 390 ms      P Wave Axis -27 deg      QRS Axis -40 deg      T Axis 48 years  This was formally interpreted by myself at the time of care.            Consultation       Medical Decision Making     Patient is a 72 year old male presents today from the urgent care because even been coughing having generalized fatigue and feeling lightheaded over the last couple days. Patient denies having any chest pain to me denies any nausea denies any diaphoresis. Denies any fevers chills or sweats. Patient had a outpatient chest x-ray was told he had bronchitis. Not able to view that x-ray here. X-ray was repeated there is no lobar infiltrate or pneumothorax seen. Head CT scan does not show any acute abnormalities at this time. EKG does not demonstrate any acute abnormalities concerning for acute coronary syndrome or acute myocardial infarction at this time.   This patient feels fine other than his fatigue and lightheadedness and is not anemic he'll be discharged to home follow up with his primary care doctor in the next 24-48 hours. Patient should return to the emergency department if he has any acute worsening of his symptoms.  Patient was prescribed antibiotic by the urgent care doctors and he should finish all that prescription as instructed.      MDM    ED Course    In addition to the above history, please see nursing notes. Allergies, meds, past medical, family, social hx, and the results of the diagnostic studies performed have been reviewed by myself.       Diagnosis / Disposition     Clinical Impression  1. Bronchitis        Disposition  ED Disposition     ED Disposition Condition Date/Time Comment    Discharge  Sun Apr 27, 2017  5:44 PM Henry Hines discharge to home/self care.    Condition at disposition: Stable            Follow up for Discharged Patients  Anise Salvo, MD  [address]  [address] [address]  [phone]    Schedule an appointment as soon as possible for a visit in 1 day        Prescriptions for Discharged Patients  New Prescriptions    No medications on file               Procedures     None          Jane Canary, D.O.      ATTESTATIONS      The documentation recorded by my scribe, Wandra Feinstein, accurately reflects the services I personally performed and the decisions made by me.  Jane Canary, D.O.    Note: This chart was generated by the Epic EMR system/speech recognition and may contain inherent errors or omissions not intended by the user. Grammatical errors, random word insertions, deletions, pronoun errors and incomplete sentences are occasional consequences of this technology due to software limitations. Not all errors are caught or corrected. If there are questions or concerns about the content of this note or information contained within the body of this dictation they should be addressed directly with the author for clarification        Izora Gala., DO  04/27/17 1751

## 2017-04-27 NOTE — ED Triage Notes (Signed)
Recent dx of anemia about 5 weeks ago. Blood transfusion Tuesday. Wednesday started not feeling well. Pt c/o weakness, falls, shortness of breath, nonproductive dry cough, chest pain when coughing.   Denies feeling lightheaded, dizzy.   Pt at Urgent Care today, dx with bronchitis and was told to come to ED due to looking pale, weakness, and falls.

## 2017-04-29 ENCOUNTER — Ambulatory Visit
Admission: RE | Admit: 2017-04-29 | Discharge: 2017-04-29 | Disposition: A | Discharge status: Home / Self Care | Payer: Medicare Other | Source: Ambulatory Visit

## 2017-04-29 DIAGNOSIS — J00 Acute nasopharyngitis [common cold]: Secondary | ICD-10-CM | POA: Diagnosis not present

## 2017-04-29 MED ORDER — ONDANSETRON HCL 4 MG/2ML IJ SOLN
4.0000 mg | Freq: Once | INTRAMUSCULAR | Status: AC
Start: 2017-04-29 — End: 2017-04-29
  Administered 2017-04-29: 09:00:00 4 mg via INTRAVENOUS
  Filled 2017-04-29: qty 2

## 2017-04-29 MED ORDER — SODIUM CHLORIDE 0.9 % IV SOLN
125.0000 mg | Freq: Once | INTRAVENOUS | Status: AC
Start: 2017-04-29 — End: 2017-04-29
  Administered 2017-04-29: 09:00:00 125 mg via INTRAVENOUS
  Filled 2017-04-29: qty 10

## 2017-05-05 ENCOUNTER — Encounter: Payer: Self-pay | Admitting: Psychology

## 2017-05-05 ENCOUNTER — Ambulatory Visit (INDEPENDENT_AMBULATORY_CARE_PROVIDER_SITE_OTHER): Payer: PPO | Admitting: Psychology

## 2017-05-05 DIAGNOSIS — F0391 Unspecified dementia with behavioral disturbance: Secondary | ICD-10-CM | POA: Diagnosis not present
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
## 2017-05-06 ENCOUNTER — Ambulatory Visit
Admission: RE | Admit: 2017-05-06 | Discharge: 2017-05-06 | Disposition: A | Discharge status: Home / Self Care | Payer: Medicare Other | Source: Ambulatory Visit

## 2017-05-06 MED ORDER — SODIUM CHLORIDE 0.9 % IV SOLN
125.0000 mg | Freq: Once | INTRAVENOUS | Status: AC
Start: 2017-05-06 — End: 2017-05-06
  Administered 2017-05-06: 09:00:00 125 mg via INTRAVENOUS
  Filled 2017-05-06: qty 10

## 2017-05-06 MED ORDER — ONDANSETRON HCL 4 MG/2ML IJ SOLN
4.0000 mg | Freq: Once | INTRAMUSCULAR | Status: AC
Start: 2017-05-06 — End: 2017-05-06
  Administered 2017-05-06: 09:00:00 4 mg via INTRAVENOUS
  Filled 2017-05-06: qty 2

## 2017-05-13 ENCOUNTER — Ambulatory Visit
Admission: RE | Admit: 2017-05-13 | Discharge: 2017-05-13 | Disposition: A | Discharge status: Home / Self Care | Payer: Medicare Other | Source: Ambulatory Visit | Attending: Urology | Admitting: Urology

## 2017-05-13 DIAGNOSIS — D509 Iron deficiency anemia, unspecified: Secondary | ICD-10-CM | POA: Insufficient documentation

## 2017-05-13 MED ORDER — ONDANSETRON HCL 4 MG/2ML IJ SOLN
4.0000 mg | Freq: Once | INTRAMUSCULAR | Status: AC
Start: 2017-05-13 — End: 2017-05-13
  Administered 2017-05-13: 09:00:00 4 mg via INTRAVENOUS
  Filled 2017-05-13: qty 2

## 2017-05-13 MED ORDER — SODIUM CHLORIDE 0.9 % IV SOLN
125.0000 mg | Freq: Once | INTRAVENOUS | Status: AC
Start: 2017-05-13 — End: 2017-05-13
  Administered 2017-05-13: 09:00:00 125 mg via INTRAVENOUS
  Filled 2017-05-13: qty 10

## 2017-05-20 ENCOUNTER — Ambulatory Visit: Payer: Medicare Other

## 2017-05-28 DIAGNOSIS — I10 Essential (primary) hypertension: Secondary | ICD-10-CM | POA: Diagnosis not present

## 2017-05-28 DIAGNOSIS — F324 Major depressive disorder, single episode, in partial remission: Secondary | ICD-10-CM | POA: Diagnosis not present

## 2017-05-28 DIAGNOSIS — E7800 Pure hypercholesterolemia, unspecified: Secondary | ICD-10-CM | POA: Diagnosis not present

## 2017-05-28 DIAGNOSIS — E1165 Type 2 diabetes mellitus with hyperglycemia: Secondary | ICD-10-CM | POA: Diagnosis not present

## 2017-05-28 DIAGNOSIS — M48062 Spinal stenosis, lumbar region with neurogenic claudication: Secondary | ICD-10-CM | POA: Diagnosis not present

## 2017-05-28 DIAGNOSIS — R251 Tremor, unspecified: Secondary | ICD-10-CM | POA: Diagnosis not present

## 2017-05-28 DIAGNOSIS — F0390 Unspecified dementia without behavioral disturbance: Secondary | ICD-10-CM | POA: Diagnosis not present

## 2017-05-28 DIAGNOSIS — F411 Generalized anxiety disorder: Secondary | ICD-10-CM | POA: Diagnosis not present
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
## 2017-06-02 ENCOUNTER — Ambulatory Visit: Payer: PPO | Admitting: Psychology

## 2017-06-02 ENCOUNTER — Encounter: Payer: Self-pay | Admitting: Psychology

## 2017-06-02 DIAGNOSIS — F0391 Unspecified dementia with behavioral disturbance: Secondary | ICD-10-CM

## 2017-06-10 DIAGNOSIS — I10 Essential (primary) hypertension: Secondary | ICD-10-CM | POA: Diagnosis not present

## 2017-06-10 DIAGNOSIS — F0390 Unspecified dementia without behavioral disturbance: Secondary | ICD-10-CM | POA: Diagnosis not present

## 2017-06-10 DIAGNOSIS — F324 Major depressive disorder, single episode, in partial remission: Secondary | ICD-10-CM | POA: Diagnosis not present

## 2017-06-10 DIAGNOSIS — F419 Anxiety disorder, unspecified: Secondary | ICD-10-CM | POA: Diagnosis not present

## 2017-06-11 DIAGNOSIS — F0390 Unspecified dementia without behavioral disturbance: Secondary | ICD-10-CM | POA: Diagnosis not present

## 2017-06-11 DIAGNOSIS — M1990 Unspecified osteoarthritis, unspecified site: Secondary | ICD-10-CM | POA: Diagnosis not present

## 2017-06-11 DIAGNOSIS — R41841 Cognitive communication deficit: Secondary | ICD-10-CM | POA: Diagnosis not present

## 2017-06-19 DIAGNOSIS — F0390 Unspecified dementia without behavioral disturbance: Secondary | ICD-10-CM | POA: Diagnosis not present

## 2017-06-19 DIAGNOSIS — F324 Major depressive disorder, single episode, in partial remission: Secondary | ICD-10-CM | POA: Diagnosis not present

## 2017-06-19 DIAGNOSIS — I10 Essential (primary) hypertension: Secondary | ICD-10-CM | POA: Diagnosis not present

## 2017-06-19 DIAGNOSIS — W19XXXA Unspecified fall, initial encounter: Secondary | ICD-10-CM | POA: Diagnosis not present

## 2017-06-25 DIAGNOSIS — D485 Neoplasm of uncertain behavior of skin: Secondary | ICD-10-CM | POA: Diagnosis not present

## 2017-06-25 DIAGNOSIS — D0462 Carcinoma in situ of skin of left upper limb, including shoulder: Secondary | ICD-10-CM | POA: Diagnosis not present

## 2017-06-26 DIAGNOSIS — Z933 Colostomy status: Secondary | ICD-10-CM | POA: Diagnosis not present

## 2017-07-10 DIAGNOSIS — C44629 Squamous cell carcinoma of skin of left upper limb, including shoulder: Secondary | ICD-10-CM | POA: Diagnosis not present

## 2017-07-12 DIAGNOSIS — F0390 Unspecified dementia without behavioral disturbance: Secondary | ICD-10-CM | POA: Diagnosis not present

## 2017-07-12 DIAGNOSIS — M1990 Unspecified osteoarthritis, unspecified site: Secondary | ICD-10-CM | POA: Diagnosis not present

## 2017-07-14 DIAGNOSIS — R569 Unspecified convulsions: Secondary | ICD-10-CM | POA: Diagnosis not present

## 2017-07-14 DIAGNOSIS — E119 Type 2 diabetes mellitus without complications: Secondary | ICD-10-CM | POA: Diagnosis not present

## 2017-07-14 DIAGNOSIS — E1142 Type 2 diabetes mellitus with diabetic polyneuropathy: Secondary | ICD-10-CM | POA: Diagnosis not present

## 2017-07-14 DIAGNOSIS — I10 Essential (primary) hypertension: Secondary | ICD-10-CM | POA: Diagnosis not present

## 2017-07-22 DIAGNOSIS — E1142 Type 2 diabetes mellitus with diabetic polyneuropathy: Secondary | ICD-10-CM | POA: Diagnosis not present

## 2017-07-22 DIAGNOSIS — R569 Unspecified convulsions: Secondary | ICD-10-CM | POA: Diagnosis not present

## 2017-07-22 DIAGNOSIS — I10 Essential (primary) hypertension: Secondary | ICD-10-CM | POA: Diagnosis not present

## 2017-08-11 DIAGNOSIS — F0390 Unspecified dementia without behavioral disturbance: Secondary | ICD-10-CM | POA: Diagnosis not present

## 2017-08-12 DIAGNOSIS — C189 Malignant neoplasm of colon, unspecified: Secondary | ICD-10-CM | POA: Diagnosis not present

## 2017-08-12 DIAGNOSIS — R569 Unspecified convulsions: Secondary | ICD-10-CM | POA: Diagnosis not present

## 2017-08-12 DIAGNOSIS — I10 Essential (primary) hypertension: Secondary | ICD-10-CM | POA: Diagnosis not present

## 2017-08-12 DIAGNOSIS — E119 Type 2 diabetes mellitus without complications: Secondary | ICD-10-CM | POA: Diagnosis not present

## 2017-08-22 DIAGNOSIS — Z933 Colostomy status: Secondary | ICD-10-CM | POA: Diagnosis not present

## 2017-08-31 DIAGNOSIS — F0390 Unspecified dementia without behavioral disturbance: Secondary | ICD-10-CM | POA: Diagnosis not present

## 2017-08-31 DIAGNOSIS — M48062 Spinal stenosis, lumbar region with neurogenic claudication: Secondary | ICD-10-CM | POA: Diagnosis not present

## 2017-08-31 DIAGNOSIS — I10 Essential (primary) hypertension: Secondary | ICD-10-CM | POA: Diagnosis not present

## 2017-08-31 DIAGNOSIS — R569 Unspecified convulsions: Secondary | ICD-10-CM | POA: Diagnosis not present

## 2017-09-04 DIAGNOSIS — Z9181 History of falling: Secondary | ICD-10-CM | POA: Diagnosis not present

## 2017-09-04 DIAGNOSIS — Z933 Colostomy status: Secondary | ICD-10-CM | POA: Diagnosis not present

## 2017-09-04 DIAGNOSIS — M48061 Spinal stenosis, lumbar region without neurogenic claudication: Secondary | ICD-10-CM | POA: Diagnosis not present

## 2017-09-04 DIAGNOSIS — F419 Anxiety disorder, unspecified: Secondary | ICD-10-CM | POA: Diagnosis not present

## 2017-09-04 DIAGNOSIS — E7800 Pure hypercholesterolemia, unspecified: Secondary | ICD-10-CM | POA: Diagnosis not present

## 2017-09-04 DIAGNOSIS — C189 Malignant neoplasm of colon, unspecified: Secondary | ICD-10-CM | POA: Diagnosis not present

## 2017-09-04 DIAGNOSIS — F329 Major depressive disorder, single episode, unspecified: Secondary | ICD-10-CM | POA: Diagnosis not present

## 2017-09-04 DIAGNOSIS — F0390 Unspecified dementia without behavioral disturbance: Secondary | ICD-10-CM | POA: Diagnosis not present

## 2017-09-04 DIAGNOSIS — I10 Essential (primary) hypertension: Secondary | ICD-10-CM | POA: Diagnosis not present

## 2017-09-04 DIAGNOSIS — G40909 Epilepsy, unspecified, not intractable, without status epilepticus: Secondary | ICD-10-CM | POA: Diagnosis not present

## 2017-09-04 DIAGNOSIS — Z85038 Personal history of other malignant neoplasm of large intestine: Secondary | ICD-10-CM | POA: Diagnosis not present

## 2017-09-04 DIAGNOSIS — M6281 Muscle weakness (generalized): Secondary | ICD-10-CM | POA: Diagnosis not present

## 2017-09-04 DIAGNOSIS — E1142 Type 2 diabetes mellitus with diabetic polyneuropathy: Secondary | ICD-10-CM | POA: Diagnosis not present

## 2017-09-10 DIAGNOSIS — Z85828 Personal history of other malignant neoplasm of skin: Secondary | ICD-10-CM | POA: Diagnosis not present

## 2017-09-10 DIAGNOSIS — L821 Other seborrheic keratosis: Secondary | ICD-10-CM | POA: Diagnosis not present

## 2017-09-10 DIAGNOSIS — L570 Actinic keratosis: Secondary | ICD-10-CM | POA: Diagnosis not present

## 2017-09-11 DIAGNOSIS — I10 Essential (primary) hypertension: Secondary | ICD-10-CM | POA: Diagnosis not present

## 2017-09-11 DIAGNOSIS — R2689 Other abnormalities of gait and mobility: Secondary | ICD-10-CM | POA: Diagnosis not present

## 2017-09-11 DIAGNOSIS — E7800 Pure hypercholesterolemia, unspecified: Secondary | ICD-10-CM | POA: Diagnosis not present

## 2017-09-11 DIAGNOSIS — F0280 Dementia in other diseases classified elsewhere without behavioral disturbance: Secondary | ICD-10-CM | POA: Diagnosis not present

## 2017-09-11 DIAGNOSIS — F419 Anxiety disorder, unspecified: Secondary | ICD-10-CM | POA: Diagnosis not present

## 2017-09-11 DIAGNOSIS — R251 Tremor, unspecified: Secondary | ICD-10-CM | POA: Diagnosis not present

## 2017-09-11 DIAGNOSIS — M48062 Spinal stenosis, lumbar region with neurogenic claudication: Secondary | ICD-10-CM | POA: Diagnosis not present

## 2017-09-11 DIAGNOSIS — E119 Type 2 diabetes mellitus without complications: Secondary | ICD-10-CM | POA: Diagnosis not present

## 2017-09-11 DIAGNOSIS — F324 Major depressive disorder, single episode, in partial remission: Secondary | ICD-10-CM | POA: Diagnosis not present

## 2017-10-04 DIAGNOSIS — G3183 Dementia with Lewy bodies: Secondary | ICD-10-CM | POA: Diagnosis not present

## 2017-10-04 DIAGNOSIS — R2689 Other abnormalities of gait and mobility: Secondary | ICD-10-CM | POA: Diagnosis not present

## 2017-10-04 DIAGNOSIS — M48062 Spinal stenosis, lumbar region with neurogenic claudication: Secondary | ICD-10-CM | POA: Diagnosis not present

## 2017-10-23 ENCOUNTER — Encounter (HOSPITAL_COMMUNITY): Payer: Self-pay | Admitting: Emergency Medicine

## 2017-10-23 ENCOUNTER — Emergency Department (HOSPITAL_COMMUNITY)
Admission: EM | Admit: 2017-10-23 | Discharge: 2017-10-23 | Disposition: A | Discharge status: Home / Self Care | Payer: PPO | Attending: Emergency Medicine | Admitting: Emergency Medicine

## 2017-10-23 ENCOUNTER — Emergency Department (HOSPITAL_COMMUNITY): Payer: PPO

## 2017-10-23 ENCOUNTER — Other Ambulatory Visit: Payer: Self-pay

## 2017-10-23 DIAGNOSIS — Y999 Unspecified external cause status: Secondary | ICD-10-CM | POA: Insufficient documentation

## 2017-10-23 DIAGNOSIS — W010XXA Fall on same level from slipping, tripping and stumbling without subsequent striking against object, initial encounter: Secondary | ICD-10-CM | POA: Insufficient documentation

## 2017-10-23 DIAGNOSIS — Z79899 Other long term (current) drug therapy: Secondary | ICD-10-CM | POA: Diagnosis not present

## 2017-10-23 DIAGNOSIS — I10 Essential (primary) hypertension: Secondary | ICD-10-CM | POA: Insufficient documentation

## 2017-10-23 DIAGNOSIS — Z87891 Personal history of nicotine dependence: Secondary | ICD-10-CM | POA: Insufficient documentation

## 2017-10-23 DIAGNOSIS — S20212A Contusion of left front wall of thorax, initial encounter: Secondary | ICD-10-CM

## 2017-10-23 DIAGNOSIS — S300XXA Contusion of lower back and pelvis, initial encounter: Secondary | ICD-10-CM

## 2017-10-23 DIAGNOSIS — F0150 Vascular dementia without behavioral disturbance: Secondary | ICD-10-CM | POA: Diagnosis not present

## 2017-10-23 DIAGNOSIS — Z7982 Long term (current) use of aspirin: Secondary | ICD-10-CM | POA: Diagnosis not present

## 2017-10-23 DIAGNOSIS — S3992XA Unspecified injury of lower back, initial encounter: Secondary | ICD-10-CM | POA: Diagnosis not present

## 2017-10-23 DIAGNOSIS — M545 Low back pain: Secondary | ICD-10-CM | POA: Diagnosis not present

## 2017-10-23 DIAGNOSIS — E119 Type 2 diabetes mellitus without complications: Secondary | ICD-10-CM | POA: Diagnosis not present

## 2017-10-23 DIAGNOSIS — S299XXA Unspecified injury of thorax, initial encounter: Secondary | ICD-10-CM | POA: Diagnosis not present

## 2017-10-23 DIAGNOSIS — Y929 Unspecified place or not applicable: Secondary | ICD-10-CM | POA: Insufficient documentation

## 2017-10-23 DIAGNOSIS — Z85038 Personal history of other malignant neoplasm of large intestine: Secondary | ICD-10-CM | POA: Diagnosis not present

## 2017-10-23 DIAGNOSIS — Z85040 Personal history of malignant carcinoid tumor of rectum: Secondary | ICD-10-CM | POA: Diagnosis not present

## 2017-10-23 DIAGNOSIS — Y9301 Activity, walking, marching and hiking: Secondary | ICD-10-CM | POA: Diagnosis not present

## 2017-10-23 MED ORDER — HYDROCODONE-ACETAMINOPHEN 5-325 MG PO TABS
2.0000 | ORAL_TABLET | Freq: Once | ORAL | Status: AC
  Administered 2017-10-23: 2 via ORAL
  Filled 2017-10-23: qty 2

## 2017-10-23 MED ORDER — ONDANSETRON HCL 4 MG PO TABS
4.0000 mg | ORAL_TABLET | Freq: Once | ORAL | Status: AC
  Administered 2017-10-23: 4 mg via ORAL
  Filled 2017-10-23: qty 1

## 2017-10-23 NOTE — Discharge Instructions (Addendum)
Vital signs are within normal limits.  Pulse oximetry is 100% on room air.  The x-ray of the chest and ribs is negative for acute problem.  No fractures seen.  X-ray of the lumbar spine shows degenerative disc problems, arthritis, but no fracture and no dislocation.  The examination favors bruises and contusions to the rib/chest wall area, and to the lower back/lumbar area.  Heating pad may be helpful.  Please use Tylenol extra strength every 4 hours as needed.  You may continue your current medications, which will also be helpful with the discomfort.  Please follow-up with Dr. Kenton Kingfisher, or return to the emergency department if any changes in condition, problems, or concerns.

## 2017-10-23 NOTE — ED Triage Notes (Signed)
PT states he lost his balance while walking with his walker and fell backwards. PT c/o lower back pain since fall.

## 2017-10-23 NOTE — ED Provider Notes (Signed)
[facility] EMERGENCY DEPARTMENT Provider Note   CSN: [id number] Arrival date & time: 10/23/17  1705     History   Chief Complaint Chief Complaint  Patient presents with  . Fall    HPI Ricky Conley is a 72 y.o. male.  Patient is a 72 year old male who presents to the emergency department with his wife following a fall. Patient's wife reports the patient suffers from arthritis, bipolar disorder, depression, diabetes mellitus, and subcortical vascular dementia/Binswanger's disease.  The patient does not do much walking, and when he is walking he has his walker.  This is due to his multiple medical issues.  Today he lost his balance even with his walker and fell backwards.  He injured the lower back pain.  The patient states he has had problems with pain since that time.  He did not have loss of bowel or bladder function.  He has not noticed any new weakness of his lower extremities.  He denies hitting his head, he has not had any problem with his upper extremities since the fall.  The patient denies being on any anticoagulation medications.  He presents now for evaluation because his back seems to be hurting him longer than usual when he falls.    The history is provided by the patient and the spouse.    Past Medical History:  Diagnosis Date  . Anxiety   . Arthritis   . Ascending aortic aneurysm (HCC)    Dr. Cyndia Bent follows- last check 2 yrs.  . Bipolar disorder ([place])    tx Depakote  . Colon cancer [facility]) 2001   Chemotherapy, radiation- no longer seeing oncology  . Depression   . Diabetes mellitus without complication ([place])    Type II " controlled with diet"  . Diet-controlled diabetes mellitus ([place])   . Gallstones, common bile duct   . Hyperlipidemia   . Hypertension   . Impaired hearing    wears hearing aid left ear.  . Neuromuscular disorder ([place])    rectal nerve damage' oversewed" chronic pain tx. Gabapentin.  . Rectal cancer ([place])   . Subcortical vascular dementia      Patient Active Problem List   Diagnosis Date Noted  . 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 Frequent falls 07/05/2015  . Acute encephalopathy 06/29/2015  . Encephalopathy 06/29/2015  . Choledocholithiasis   . Dilated bile duct   . Dementia with behavioral disturbance 06/28/2015  . Generalized anxiety disorder 06/15/2015  . Rectal cancer ([place]) 06/12/2015  . Abnormal CT scan 06/09/2015  . Elevated LFTs 06/09/2015  . Transaminitis 04/20/2015  . Hypotension 04/18/2015  . Weakness generalized 04/18/2015  . Loss of balance 04/18/2015  . Generalized weakness 04/18/2015  . Fever 04/09/2015  . Influenza-like illness 04/09/2015  . Delirium 04/09/2015  . Hypernatremia 02/18/2013  . Altered mental status 02/18/2013  . Bipolar disorder ([place]) 02/18/2013  . 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 Hyperlipidemia 02/18/2013  . Ascending aortic aneurysm ([place]) 08/12/2012    Past Surgical History:  Procedure Laterality Date  . BACK SURGERY  2003  . CARPAL TUNNEL RELEASE Bilateral 2008  . COLECTOMY  2001  . COLONOSCOPY    . COLOSTOMY  2001  . ERCP N/A 06/29/2015   Procedure: ENDOSCOPIC RETROGRADE CHOLANGIOPANCREATOGRAPHY (ERCP);  Surgeon: Ladene Artist, MD;  Location: [facility];  Service: Endoscopy;  Laterality: N/A;  . FRACTURE SURGERY Left 2009   ankle with hardware        Home Medications    Prior to  Admission medications   Medication Sig Start Date End Date Taking? Authorizing Provider  amLODipine (NORVASC) 5 MG tablet Take 1 tablet (5 mg total) by mouth daily. 04/12/15   Velvet Bathe, MD  aspirin 81 MG EC tablet Take 81 mg by mouth every morning.     [provider]  atorvastatin (LIPITOR) 20 MG tablet Take 1 tablet by mouth daily. 03/12/16   [provider]  buPROPion (WELLBUTRIN XL) 300 MG 24 hr tablet Take 300 mg by mouth every morning.    [provider]  cholecalciferol (VITAMIN D) 1000 units tablet Take 1,000 Units by mouth daily.     [provider]  clonazePAM (KLONOPIN) 0.5 MG tablet Take 0.5 mg by mouth. One in the morning, two at night    [provider]  doxycycline (VIBRA-TABS) 100 MG tablet Take 1 tablet by mouth 2 (two) times daily. 12/19/15   [provider]  gabapentin (NEURONTIN) 300 MG capsule Take 300 mg by mouth 3 (three) times daily.     [provider]  omeprazole (PRILOSEC) 20 MG capsule TAKE 1 CAPSULE BY MOUTH ONCE DAILY 01/22/17   Ladene Artist, MD  propranolol (INDERAL) 20 MG tablet Take 10 mg by mouth 2 (two) times daily.     [provider]    Family History Family History  Problem Relation Age of Onset  . Stroke Father   . Cancer Maternal Uncle   . Cancer Brother   . Colon cancer Neg Hx   . Esophageal cancer Neg Hx   . Rectal cancer Neg Hx   . Stomach cancer Neg Hx     Social History Social History   Tobacco Use  . Smoking status: Former Smoker    Last attempt to quit: 05/30/1970    Years since quitting: 47.4  . Smokeless tobacco: Former Systems developer    Quit date: 05/30/1998  . Tobacco comment: quit chewing tobacco approx 2012  Substance Use Topics  . Alcohol use: No    Alcohol/week: 0.0 standard drinks  . Drug use: No     Allergies   Patient has no known allergies.   Review of Systems Review of Systems  Constitutional: Negative for activity change.       All ROS Neg except as noted in HPI  HENT: Negative for nosebleeds.   Eyes: Negative for photophobia and discharge.  Respiratory: Negative for cough, shortness of breath and wheezing.   Cardiovascular: Negative for chest pain and palpitations.  Gastrointestinal: Negative for abdominal pain and blood in stool.  Genitourinary: Negative for dysuria, frequency and hematuria.  Musculoskeletal: Positive for arthralgias and myalgias. Negative for back pain and neck pain.  Skin: Negative.   Neurological: Positive for weakness. Negative for dizziness, seizures and speech difficulty.    Psychiatric/Behavioral: Negative for confusion and hallucinations.     Physical Exam Updated Vital Signs BP 130/77 (BP Location: Right Arm)   Pulse 88   Temp 98.1 F (36.7 C) (Oral)   Resp 18   Ht 5\' 10"  (1.778 m)   Wt 82.1 kg   SpO2 100%   BMI 25.97 kg/m   Physical Exam  Constitutional: He is oriented to person, place, and time. He appears well-developed and well-nourished.  Non-toxic appearance.  HENT:  Head: Normocephalic.  Right Ear: Tympanic membrane and external ear normal.  Left Ear: Tympanic membrane and external ear normal.  No facial or scalp hematoma noted.  Eyes: Pupils are equal, round, and reactive to light. EOM and lids  are normal.  Neck: Normal range of motion. Neck supple. Carotid bruit is not present.  Cardiovascular: Normal rate, regular rhythm, normal heart sounds, intact distal pulses and normal pulses.  Pulmonary/Chest: Effort normal and breath sounds normal. No accessory muscle usage. No respiratory distress.     He exhibits tenderness. He exhibits no deformity and no retraction.  Abdominal: Soft. Bowel sounds are normal. There is no tenderness. There is no guarding.  Musculoskeletal: Normal range of motion.       Lumbar back: He exhibits pain.       Back:  No cervical, thoracic, or lumbar step off.  Lymphadenopathy:       Head (right side): No submandibular adenopathy present.       Head (left side): No submandibular adenopathy present.    He has no cervical adenopathy.  Neurological: He is alert and oriented to person, place, and time. He has normal strength. No cranial nerve deficit or sensory deficit.  Skin: Skin is warm and dry.  Psychiatric: He has a normal mood and affect. His speech is normal.  Nursing note and vitals reviewed.    ED Treatments / Results  Labs (all labs ordered are listed, but only abnormal results are displayed) Labs Reviewed - No data to display  EKG None  Radiology Dg Ribs Unilateral W/chest Left  Result  Date: 10/23/2017 CLINICAL DATA:  [place] backwards today from a standing position, severe low back pain since, diabetes mellitus, former surgery, colon cancer EXAM: LEFT RIBS AND CHEST - 3+ VIEW COMPARISON:  Chest radiographs 06/13/2016 FINDINGS: Normal heart size, mediastinal contours, and pulmonary vascularity. Lungs clear. No pulmonary infiltrate, pleural effusion or pneumothorax. Osseous mineralization normal. Scattered endplate spur formation thoracic spine. No definite rib fracture or bone destruction. IMPRESSION: No acute abnormalities. Electronically Signed   By: Lavonia Dana M.D.   On: 10/23/2017 19:59   Dg Lumbar Spine Complete  Result Date: 10/23/2017 CLINICAL DATA:  [place] backwards today from a standing position, severe low back pain since, diabetes mellitus, former surgery, colon cancer EXAM: LUMBAR SPINE - COMPLETE 4+ VIEW COMPARISON:  CT abdomen and pelvis 03/22/2016 FINDINGS: Five non-rib-bearing lumbar vertebra. Partially lumbarized S1 segment of sacrum. Bones appear demineralized. Multilevel degenerative disc and facet disease changes of the lumbar spine. Vertebral body heights maintained. Minimal anterolisthesis at L4-L5 and retrolisthesis at L5-S1 unchanged. SI joints symmetric. Atherosclerotic calcifications aorta. IMPRESSION: Multilevel degenerative disc and facet disease changes of the lumbar spine. No acute abnormalities. Electronically Signed   By: Lavonia Dana M.D.   On: 10/23/2017 20:02    Procedures Procedures (including critical care time)  Medications Ordered in ED Medications  HYDROcodone-acetaminophen (NORCO/VICODIN) 5-325 MG per tablet 2 tablet (2 tablets Oral Given 10/23/17 1933)  ondansetron (ZOFRAN) tablet 4 mg (4 mg Oral Given 10/23/17 1933)     Initial Impression / Assessment and Plan / ED Course  I have reviewed the triage vital signs and the nursing notes.  Pertinent labs & imaging results that were available during my care of the patient were reviewed by me and  considered in my medical decision making (see chart for details).       Final Clinical Impressions(s) / ED Diagnoses MDM  Vital signs reviewed.  Patient treated in the emergency department for pain.  X-ray of the left ribs and chest is negative for infiltrate, effusion, or pneumothorax.  No bony abnormalities noted about the ribs.  X-ray of the lumbar spine shows multilevel degenerative disc disease.  But no fracture, no  dislocation.  Asked the patient to use the walker at all times when up and about.  Patient given medication to use for pain and soreness.  Patient is to follow-up with with Dr. Shirline Frees.  Patient to return to the emergency department if any changes in condition, problems, or concerns.   Final diagnoses:  Lumbar contusion, initial encounter  Contusion of ribs, left, initial encounter    ED Discharge Orders    None       Lily Kocher, PA-C 10/24/17 1625    Nat Christen, MD 10/25/17 [phone]

## 2017-10-23 NOTE — ED Notes (Signed)
Patient's wife went out to get their car. Patient unable to sign, notation made on pad. Discharge instructions discussed with wife, verbalized understanding. Consent to discharge affirmed.

## 2017-10-27 ENCOUNTER — Inpatient Hospital Stay (HOSPITAL_COMMUNITY)
Admission: EM | Admit: 2017-10-27 | Discharge: 2017-10-31 | DRG: 552 | Disposition: A | Discharge status: Skilled Nursing Facility | Payer: PPO | Attending: Internal Medicine | Admitting: Internal Medicine

## 2017-10-27 ENCOUNTER — Other Ambulatory Visit: Payer: Self-pay

## 2017-10-27 ENCOUNTER — Emergency Department (HOSPITAL_COMMUNITY): Payer: PPO

## 2017-10-27 DIAGNOSIS — G9009 Other idiopathic peripheral autonomic neuropathy: Secondary | ICD-10-CM | POA: Diagnosis not present

## 2017-10-27 DIAGNOSIS — Z87891 Personal history of nicotine dependence: Secondary | ICD-10-CM | POA: Diagnosis not present

## 2017-10-27 DIAGNOSIS — Z974 Presence of external hearing-aid: Secondary | ICD-10-CM | POA: Diagnosis not present

## 2017-10-27 DIAGNOSIS — E785 Hyperlipidemia, unspecified: Secondary | ICD-10-CM | POA: Diagnosis present

## 2017-10-27 DIAGNOSIS — Z85048 Personal history of other malignant neoplasm of rectum, rectosigmoid junction, and anus: Secondary | ICD-10-CM | POA: Diagnosis not present

## 2017-10-27 DIAGNOSIS — Z823 Family history of stroke: Secondary | ICD-10-CM

## 2017-10-27 DIAGNOSIS — F3170 Bipolar disorder, currently in remission, most recent episode unspecified: Secondary | ICD-10-CM | POA: Diagnosis not present

## 2017-10-27 DIAGNOSIS — Z993 Dependence on wheelchair: Secondary | ICD-10-CM | POA: Diagnosis not present

## 2017-10-27 DIAGNOSIS — Z9049 Acquired absence of other specified parts of digestive tract: Secondary | ICD-10-CM

## 2017-10-27 DIAGNOSIS — G3183 Dementia with Lewy bodies: Secondary | ICD-10-CM | POA: Diagnosis not present

## 2017-10-27 DIAGNOSIS — M48061 Spinal stenosis, lumbar region without neurogenic claudication: Secondary | ICD-10-CM | POA: Diagnosis not present

## 2017-10-27 DIAGNOSIS — W19XXXA Unspecified fall, initial encounter: Secondary | ICD-10-CM | POA: Diagnosis present

## 2017-10-27 DIAGNOSIS — M1990 Unspecified osteoarthritis, unspecified site: Secondary | ICD-10-CM | POA: Diagnosis not present

## 2017-10-27 DIAGNOSIS — K210 Gastro-esophageal reflux disease with esophagitis: Secondary | ICD-10-CM | POA: Diagnosis not present

## 2017-10-27 DIAGNOSIS — R269 Unspecified abnormalities of gait and mobility: Secondary | ICD-10-CM | POA: Diagnosis not present

## 2017-10-27 DIAGNOSIS — E870 Hyperosmolality and hypernatremia: Secondary | ICD-10-CM | POA: Diagnosis present

## 2017-10-27 DIAGNOSIS — Z9221 Personal history of antineoplastic chemotherapy: Secondary | ICD-10-CM

## 2017-10-27 DIAGNOSIS — R52 Pain, unspecified: Secondary | ICD-10-CM | POA: Diagnosis not present

## 2017-10-27 DIAGNOSIS — F0390 Unspecified dementia without behavioral disturbance: Secondary | ICD-10-CM | POA: Diagnosis not present

## 2017-10-27 DIAGNOSIS — S22068A Other fracture of T7-T8 thoracic vertebra, initial encounter for closed fracture: Principal | ICD-10-CM | POA: Diagnosis present

## 2017-10-27 DIAGNOSIS — Z933 Colostomy status: Secondary | ICD-10-CM | POA: Diagnosis not present

## 2017-10-27 DIAGNOSIS — F329 Major depressive disorder, single episode, unspecified: Secondary | ICD-10-CM | POA: Diagnosis not present

## 2017-10-27 DIAGNOSIS — K5900 Constipation, unspecified: Secondary | ICD-10-CM | POA: Diagnosis not present

## 2017-10-27 DIAGNOSIS — S22069A Unspecified fracture of T7-T8 vertebra, initial encounter for closed fracture: Secondary | ICD-10-CM | POA: Diagnosis present

## 2017-10-27 DIAGNOSIS — S3991XA Unspecified injury of abdomen, initial encounter: Secondary | ICD-10-CM | POA: Diagnosis not present

## 2017-10-27 DIAGNOSIS — G8929 Other chronic pain: Secondary | ICD-10-CM | POA: Diagnosis not present

## 2017-10-27 DIAGNOSIS — R1084 Generalized abdominal pain: Secondary | ICD-10-CM | POA: Diagnosis not present

## 2017-10-27 DIAGNOSIS — Z923 Personal history of irradiation: Secondary | ICD-10-CM | POA: Diagnosis not present

## 2017-10-27 DIAGNOSIS — Z79899 Other long term (current) drug therapy: Secondary | ICD-10-CM

## 2017-10-27 DIAGNOSIS — R946 Abnormal results of thyroid function studies: Secondary | ICD-10-CM | POA: Diagnosis present

## 2017-10-27 DIAGNOSIS — M6281 Muscle weakness (generalized): Secondary | ICD-10-CM | POA: Diagnosis not present

## 2017-10-27 DIAGNOSIS — F0151 Vascular dementia with behavioral disturbance: Secondary | ICD-10-CM | POA: Diagnosis present

## 2017-10-27 DIAGNOSIS — IMO0002 Reserved for concepts with insufficient information to code with codable children: Secondary | ICD-10-CM

## 2017-10-27 DIAGNOSIS — I712 Thoracic aortic aneurysm, without rupture: Secondary | ICD-10-CM | POA: Diagnosis present

## 2017-10-27 DIAGNOSIS — R531 Weakness: Secondary | ICD-10-CM | POA: Diagnosis not present

## 2017-10-27 DIAGNOSIS — K219 Gastro-esophageal reflux disease without esophagitis: Secondary | ICD-10-CM | POA: Diagnosis present

## 2017-10-27 DIAGNOSIS — S22009A Unspecified fracture of unspecified thoracic vertebra, initial encounter for closed fracture: Secondary | ICD-10-CM

## 2017-10-27 DIAGNOSIS — I10 Essential (primary) hypertension: Secondary | ICD-10-CM | POA: Diagnosis not present

## 2017-10-27 DIAGNOSIS — M549 Dorsalgia, unspecified: Secondary | ICD-10-CM | POA: Diagnosis not present

## 2017-10-27 DIAGNOSIS — E0781 Sick-euthyroid syndrome: Secondary | ICD-10-CM | POA: Diagnosis not present

## 2017-10-27 DIAGNOSIS — I714 Abdominal aortic aneurysm, without rupture: Secondary | ICD-10-CM | POA: Diagnosis not present

## 2017-10-27 DIAGNOSIS — Z7984 Long term (current) use of oral hypoglycemic drugs: Secondary | ICD-10-CM

## 2017-10-27 DIAGNOSIS — E1165 Type 2 diabetes mellitus with hyperglycemia: Secondary | ICD-10-CM | POA: Diagnosis not present

## 2017-10-27 DIAGNOSIS — S22069D Unspecified fracture of T7-T8 vertebra, subsequent encounter for fracture with routine healing: Secondary | ICD-10-CM | POA: Diagnosis not present

## 2017-10-27 DIAGNOSIS — F319 Bipolar disorder, unspecified: Secondary | ICD-10-CM | POA: Diagnosis present

## 2017-10-27 DIAGNOSIS — F0391 Unspecified dementia with behavioral disturbance: Secondary | ICD-10-CM | POA: Diagnosis not present

## 2017-10-27 DIAGNOSIS — Z7982 Long term (current) use of aspirin: Secondary | ICD-10-CM

## 2017-10-27 DIAGNOSIS — H9190 Unspecified hearing loss, unspecified ear: Secondary | ICD-10-CM | POA: Diagnosis present

## 2017-10-27 DIAGNOSIS — S299XXA Unspecified injury of thorax, initial encounter: Secondary | ICD-10-CM | POA: Diagnosis not present

## 2017-10-27 DIAGNOSIS — R279 Unspecified lack of coordination: Secondary | ICD-10-CM | POA: Diagnosis not present

## 2017-10-27 DIAGNOSIS — Z743 Need for continuous supervision: Secondary | ICD-10-CM | POA: Diagnosis not present

## 2017-10-27 DIAGNOSIS — R0902 Hypoxemia: Secondary | ICD-10-CM | POA: Diagnosis not present

## 2017-10-27 DIAGNOSIS — R079 Chest pain, unspecified: Secondary | ICD-10-CM | POA: Diagnosis not present

## 2017-10-27 DIAGNOSIS — F411 Generalized anxiety disorder: Secondary | ICD-10-CM | POA: Diagnosis present

## 2017-10-27 DIAGNOSIS — Z433 Encounter for attention to colostomy: Secondary | ICD-10-CM | POA: Diagnosis not present

## 2017-10-27 DIAGNOSIS — R296 Repeated falls: Secondary | ICD-10-CM

## 2017-10-27 DIAGNOSIS — I7121 Aneurysm of the ascending aorta, without rupture: Secondary | ICD-10-CM | POA: Diagnosis present

## 2017-10-27 DIAGNOSIS — F03918 Unspecified dementia, unspecified severity, with other behavioral disturbance: Secondary | ICD-10-CM | POA: Diagnosis present

## 2017-10-27 DIAGNOSIS — S3992XA Unspecified injury of lower back, initial encounter: Secondary | ICD-10-CM | POA: Diagnosis not present

## 2017-10-27 LAB — CBC WITH DIFFERENTIAL/PLATELET
Basophils Absolute: 0 10*3/uL (ref 0.0–0.1)
Basophils Relative: 0 %
EOS ABS: 0.1 10*3/uL (ref 0.0–0.7)
Eosinophils Relative: 1 %
HCT: 43.6 % (ref 39.0–52.0)
Hemoglobin: 14.1 g/dL (ref 13.0–17.0)
LYMPHS ABS: 1.7 10*3/uL (ref 0.7–4.0)
Lymphocytes Relative: 24 %
MCH: 30 pg (ref 26.0–34.0)
MCHC: 32.3 g/dL (ref 30.0–36.0)
MCV: 92.8 fL (ref 78.0–100.0)
MONO ABS: 1.2 10*3/uL — AB (ref 0.1–1.0)
MONOS PCT: 16 %
Neutro Abs: 4.1 10*3/uL (ref 1.7–7.7)
Neutrophils Relative %: 59 %
PLATELETS: 161 10*3/uL (ref 150–400)
RBC: 4.7 MIL/uL (ref 4.22–5.81)
RDW: 13.8 % (ref 11.5–15.5)
WBC: 7.1 10*3/uL (ref 4.0–10.5)

## 2017-10-27 LAB — LIPASE, BLOOD: LIPASE: 31 U/L (ref 11–51)

## 2017-10-27 LAB — URINALYSIS, ROUTINE W REFLEX MICROSCOPIC
Bilirubin Urine: NEGATIVE
GLUCOSE, UA: 50 mg/dL — AB
HGB URINE DIPSTICK: NEGATIVE
KETONES UR: 5 mg/dL — AB
Leukocytes, UA: NEGATIVE
Nitrite: NEGATIVE
PH: 5 (ref 5.0–8.0)
PROTEIN: NEGATIVE mg/dL
Specific Gravity, Urine: 1.029 (ref 1.005–1.030)

## 2017-10-27 LAB — COMPREHENSIVE METABOLIC PANEL
ALT: 20 U/L (ref 0–44)
AST: 21 U/L (ref 15–41)
Albumin: 3.5 g/dL (ref 3.5–5.0)
Alkaline Phosphatase: 125 U/L (ref 38–126)
Anion gap: 10 (ref 5–15)
BUN: 24 mg/dL — ABNORMAL HIGH (ref 8–23)
CHLORIDE: 101 mmol/L (ref 98–111)
CO2: 30 mmol/L (ref 22–32)
CREATININE: 0.95 mg/dL (ref 0.61–1.24)
Calcium: 9.3 mg/dL (ref 8.9–10.3)
GFR calc Af Amer: >60 mL/min (ref >60)
Glucose, Bld: 150 mg/dL — ABNORMAL HIGH (ref 70–99)
Potassium: 4.7 mmol/L (ref 3.5–5.1)
Sodium: 141 mmol/L (ref 135–145)
Total Bilirubin: 1.2 mg/dL (ref 0.3–1.2)
Total Protein: 7.2 g/dL (ref 6.5–8.1)

## 2017-10-27 MED ORDER — MORPHINE SULFATE (PF) 2 MG/ML IV SOLN
1.0000 mg | INTRAVENOUS | Status: DC | PRN
  Administered 2017-10-27: 1 mg via INTRAVENOUS
  Filled 2017-10-27: qty 1

## 2017-10-27 MED ORDER — IOPAMIDOL (ISOVUE-370) INJECTION 76%
100.0000 mL | Freq: Once | INTRAVENOUS | Status: AC | PRN
  Administered 2017-10-27: 100 mL via INTRAVENOUS

## 2017-10-27 MED ORDER — METHOCARBAMOL 500 MG PO TABS
500.0000 mg | ORAL_TABLET | Freq: Four times a day (QID) | ORAL | Status: DC | PRN
  Administered 2017-10-27 – 2017-10-29 (×4): 500 mg via ORAL
  Filled 2017-10-27 (×4): qty 1

## 2017-10-27 MED ORDER — IOPAMIDOL (ISOVUE-370) INJECTION 76%
INTRAVENOUS | Status: AC
  Filled 2017-10-27: qty 100

## 2017-10-27 NOTE — ED Notes (Signed)
Patient transported to CT 

## 2017-10-27 NOTE — ED Notes (Signed)
RN in the floor unable to take report at this time. RN stated she will call me back.

## 2017-10-27 NOTE — Consult Note (Signed)
Reason for Consult: T8 Chance fracture Referring Physician: ER Dr. Darrold Span Ricky Conley is an 72 y.o. male.  HPI: 72 year old gentleman with a history of Lewy body dementia as well as colorectal cancer with colostomy bag placed who has multiple falls from chronic back pain lumbar spinal stenosis and his other medical comorbidities had a fall last Thursday and is been experiencing interscapular pain ever since.  Ricky Conley reports that occasion does radiate across his chest on the right.  Ricky Conley denies any numbness tingling his legs or weakness in his legs.  Past Medical History:  Diagnosis Date  . Anxiety   . Arthritis   . Ascending aortic aneurysm (HCC)    Dr. Cyndia Bent follows- last check 2 yrs.  . Bipolar disorder ([place])    tx Depakote  . Colon cancer [facility]) 2001   Chemotherapy, radiation- no longer seeing oncology  . Depression   . Diabetes mellitus without complication ([place])    Type II " controlled with diet"  . Diet-controlled diabetes mellitus ([place])   . Gallstones, common bile duct   . Hyperlipidemia   . Hypertension   . Impaired hearing    wears hearing aid left ear.  . Neuromuscular disorder ([place])    rectal nerve damage' oversewed" chronic pain tx. Gabapentin.  . Rectal cancer ([place])   . Subcortical vascular dementia     Past Surgical History:  Procedure Laterality Date  . BACK SURGERY  2003  . CARPAL TUNNEL RELEASE Bilateral 2008  . COLECTOMY  2001  . COLONOSCOPY    . COLOSTOMY  2001  . ERCP N/A 06/29/2015   Procedure: ENDOSCOPIC RETROGRADE CHOLANGIOPANCREATOGRAPHY (ERCP);  Surgeon: Ladene Artist, MD;  Location: [facility];  Service: Endoscopy;  Laterality: N/A;  . FRACTURE SURGERY Left 2009   ankle with hardware    Family History  Problem Relation Age of Onset  . Stroke Father   . Cancer Maternal Uncle   . Cancer Brother   . Colon cancer Neg Hx   . Esophageal cancer Neg Hx   . Rectal cancer Neg Hx   . Stomach cancer Neg Hx     Social History:  reports that Ricky Conley quit  smoking about 47 years ago. Ricky Conley quit smokeless tobacco use about 19 years ago. Ricky Conley reports that Ricky Conley does not drink alcohol or use drugs.  Allergies: No Known Allergies  Medications: I have reviewed the patient's current medications.  Results for orders placed or performed during the hospital encounter of 10/27/17 (from the past 48 hour(s))  Comprehensive metabolic panel     Status: Abnormal   Collection Time: 10/27/17  2:24 PM  Result Value Ref Range   Sodium 141 135 - 145 mmol/L   Potassium 4.7 3.5 - 5.1 mmol/L   Chloride 101 98 - 111 mmol/L   CO2 30 22 - 32 mmol/L   Glucose, Bld 150 (H) 70 - 99 mg/dL   BUN 24 (H) 8 - 23 mg/dL   Creatinine, Ser 0.95 0.61 - 1.24 mg/dL   Calcium 9.3 8.9 - 10.3 mg/dL   Total Protein 7.2 6.5 - 8.1 g/dL   Albumin 3.5 3.5 - 5.0 g/dL   AST 21 15 - 41 U/L   ALT 20 0 - 44 U/L   Alkaline Phosphatase 125 38 - 126 U/L   Total Bilirubin 1.2 0.3 - 1.2 mg/dL   GFR calc non Af Amer >60 >60 mL/min   GFR calc Af Amer >60 >60 mL/min    Comment: (NOTE) The eGFR  has been calculated using the CKD EPI equation. This calculation has not been validated in all clinical situations. eGFR's persistently <60 mL/min signify possible Chronic Kidney Disease.    Anion gap 10 5 - 15    Comment: Performed at [facility], [place] [address], [place], [place] [address]  CBC with Differential     Status: Abnormal   Collection Time: 10/27/17  2:24 PM  Result Value Ref Range   WBC 7.1 4.0 - 10.5 K/uL   RBC 4.70 4.22 - 5.81 MIL/uL   Hemoglobin 14.1 13.0 - 17.0 g/dL   HCT 43.6 39.0 - 52.0 %   MCV 92.8 78.0 - 100.0 fL   MCH 30.0 26.0 - 34.0 pg   MCHC 32.3 30.0 - 36.0 g/dL   RDW 13.8 11.5 - 15.5 %   Platelets 161 150 - 400 K/uL   Neutrophils Relative % 59 %   Neutro Abs 4.1 1.7 - 7.7 K/uL   Lymphocytes Relative 24 %   Lymphs Abs 1.7 0.7 - 4.0 K/uL   Monocytes Relative 16 %   Monocytes Absolute 1.2 (H) 0.1 - 1.0 K/uL   Eosinophils Relative 1 %   Eosinophils  Absolute 0.1 0.0 - 0.7 K/uL   Basophils Relative 0 %   Basophils Absolute 0.0 0.0 - 0.1 K/uL    Comment: Performed at [facility], [place] [address] [place], [place] [address]  Lipase, blood     Status: None   Collection Time: 10/27/17  2:24 PM  Result Value Ref Range   Lipase 31 11 - 51 U/L    Comment: Performed at [facility], [place] [address] [place], [place] [address]  Urinalysis, Routine w reflex microscopic     Status: Abnormal   Collection Time: 10/27/17  2:24 PM  Result Value Ref Range   Color, Urine YELLOW YELLOW   APPearance CLEAR CLEAR   Specific Gravity, Urine 1.029 1.005 - 1.030   pH 5.0 5.0 - 8.0   Glucose, UA 50 (A) NEGATIVE mg/dL   Hgb urine dipstick NEGATIVE NEGATIVE   Bilirubin Urine NEGATIVE NEGATIVE   Ketones, ur 5 (A) NEGATIVE mg/dL   Protein, ur NEGATIVE NEGATIVE mg/dL   Nitrite NEGATIVE NEGATIVE   Leukocytes, UA NEGATIVE NEGATIVE    Comment: Performed at [facility] [address] [place], [place] [address]    Ct Angio Chest Pe W/cm &/or Wo Cm  Result Date: 10/27/2017 CLINICAL DATA:  Chest pain following fall several days ago, initial encounter EXAM: CT ANGIOGRAPHY CHEST CT ABDOMEN AND PELVIS WITH CONTRAST TECHNIQUE: Multidetector CT imaging of the chest was performed using the standard protocol during bolus administration of intravenous contrast. Multiplanar CT image reconstructions and MIPs were obtained to evaluate the vascular anatomy. Multidetector CT imaging of the abdomen and pelvis was performed using the standard protocol during bolus administration of intravenous contrast. CONTRAST:  158m ISOVUE-370 IOPAMIDOL (ISOVUE-370) INJECTION 76% COMPARISON:  03/22/2016 CT of the abdomen FINDINGS: CTA CHEST FINDINGS Cardiovascular: Mild dilatation of the ascending aorta 4.1 cm is noted. Normal tapering in the aortic arch is seen. Atherosclerotic calcifications are noted. No findings to suggest dissection are  seen. No cardiac enlargement is noted. Diffuse coronary calcifications are seen. The pulmonary artery demonstrates a normal branching pattern without intraluminal filling defect to suggest pulmonary embolism. Mediastinum/Nodes: Thoracic inlet is within normal limits. No sizable hilar or mediastinal adenopathy is noted. The esophagus as visualized is within normal limits. Lungs/Pleura: Lungs are well aerated bilaterally.  No focal infiltrate or sizable effusion is seen. No sizable parenchymal nodules are noted. Musculoskeletal: Degenerative changes of the thoracic spine are seen. No acute rib fracture or compression deformity is seen. There is a fracture through the spinous process of T8 which is better visualized on the CT of the thoracic spine. Review of the MIP images confirms the above findings. CT ABDOMEN and PELVIS FINDINGS Hepatobiliary: Diffuse fatty infiltration of the liver is noted. The gallbladder is well distended and demonstrates a single focus of non dependent air. This is likely related to prior instrumentation and sphincterotomy and has decreased significantly in the interval from the prior CT examination. Pancreas: Unremarkable. No pancreatic ductal dilatation or surrounding inflammatory changes. Spleen: Normal in size without focal abnormality. Adrenals/Urinary Tract: Adrenal glands are within normal limits. Kidneys demonstrate a normal enhancement pattern bilaterally. Small renal cyst is noted in the lower pole of the right kidney. Bladder is partially distended. Stomach/Bowel: There are changes consistent with left mid abdominal ostomy stable from the prior exam. No obstructive or inflammatory changes of the bowel are noted. The appendix is within normal limits. Vascular/Lymphatic: Aortic atherosclerosis. No enlarged abdominal or pelvic lymph nodes. Reproductive: Prostate is unremarkable. Other: Post treatment changes are noted anterior to the sacrum and stable in appearance from the prior exam.  No ascites is noted. No free air is seen. Musculoskeletal: Degenerative changes of the lumbar spine are noted. Review of the MIP images confirms the above findings. IMPRESSION: CTA of the chest: No evidence of pulmonary emboli. T8 spinous process fracture better visualized on CT of the thoracic spine. Dilatation of the ascending aorta to 4.1 cm. Recommend annual imaging followup by CTA or MRA. This recommendation follows 2010 ACCF/AHA/AATS/ACR/ASA/SCA/SCAI/SIR/STS/SVM Guidelines for the Diagnosis and Management of Patients with Thoracic Aortic Disease. Circulation. 2010; 121: Z610-R604 CT of the abdomen and pelvis: Chronic changes similar to that seen on prior exam. No acute abnormality is noted. Electronically Signed   By: Inez Catalina M.D.   On: 10/27/2017 17:27   Ct Thoracic Spine Wo Contrast  Result Date: 10/27/2017 CLINICAL DATA:  Dementia patient status post fall with persistent back pain EXAM: CT THORACIC AND LUMBAR SPINE WITHOUT CONTRAST TECHNIQUE: Multidetector CT imaging of the thoracic and lumbar spine was performed without contrast. Multiplanar CT image reconstructions were also generated. COMPARISON:  Radiography 10/23/2017 FINDINGS: CT THORACIC SPINE FINDINGS Alignment: Normal Vertebrae: Solid bridging osteophytes throughout the entire thoracic region extending to L1. Nondisplaced fracture of the spinous process of T8. Fracture extends horizontally through the T7-8 disc level. No displacement. No canal compromise. Paraspinal and other soft tissues: Negative Disc levels: No stenosis of the canal or foramina. Small dural calcification posteriorly at T10. Not likely significant. This was present in 2017. CT LUMBAR SPINE FINDINGS Segmentation: 5 lumbar type vertebral bodies. Alignment: Normal except for 2 mm anterolisthesis L4-5. Vertebrae: No lumbosacral fracture in the region studied. Paraspinal and other soft tissues: Negative Disc levels: Moderate multifactorial stenosis at L2-3, L3-4. At L4-5,  there is severe multifactorial stenosis. At L5-S1 there is stenosis of the lateral recesses and foramina. Ankylosis of the sacroiliac joints. IMPRESSION: CT THORACIC SPINE IMPRESSION Fracture of the spinous process of T8, nondisplaced. Fracture extends transversely through the T7-8 disc level, Chance fracture equivalent. No displacement. No canal compromise. No unstable fracture seen. 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 CT LUMBAR SPINE IMPRESSION No lumbar spine fracture. 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 Bilateral lateral recess and foraminal stenosis at L5-S1. Mild to moderate canal stenosis at  L2-3 and L3-4. Electronically Signed   By: Nelson Chimes M.D.   On: 10/27/2017 17:17   Ct Lumbar Spine Wo Contrast  Result Date: 10/27/2017 CLINICAL DATA:  Dementia patient status post fall with persistent back pain EXAM: CT THORACIC AND LUMBAR SPINE WITHOUT CONTRAST TECHNIQUE: Multidetector CT imaging of the thoracic and lumbar spine was performed without contrast. Multiplanar CT image reconstructions were also generated. COMPARISON:  Radiography 10/23/2017 FINDINGS: CT THORACIC SPINE FINDINGS Alignment: Normal Vertebrae: Solid bridging osteophytes throughout the entire thoracic region extending to L1. Nondisplaced fracture of the spinous process of T8. Fracture extends horizontally through the T7-8 disc level. No displacement. No canal compromise. Paraspinal and other soft tissues: Negative Disc levels: No stenosis of the canal or foramina. Small dural calcification posteriorly at T10. Not likely significant. This was present in 2017. CT LUMBAR SPINE FINDINGS Segmentation: 5 lumbar type vertebral bodies. Alignment: Normal except for 2 mm anterolisthesis L4-5. Vertebrae: No lumbosacral fracture in the region studied. Paraspinal and other soft tissues: Negative Disc levels: Moderate multifactorial stenosis at L2-3, L3-4. At L4-5, there is severe multifactorial stenosis. At L5-S1 there is stenosis of the  lateral recesses and foramina. Ankylosis of the sacroiliac joints. IMPRESSION: CT THORACIC SPINE IMPRESSION Fracture of the spinous process of T8, nondisplaced. Fracture extends transversely through the T7-8 disc level, Chance fracture equivalent. No displacement. No canal compromise. No unstable fracture seen. 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 CT LUMBAR SPINE IMPRESSION No lumbar spine fracture. 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 Bilateral lateral recess and foraminal stenosis at L5-S1. Mild to moderate canal stenosis at L2-3 and L3-4. Electronically Signed   By: Nelson Chimes M.D.   On: 10/27/2017 17:17   Ct Abdomen Pelvis W Contrast  Result Date: 10/27/2017 CLINICAL DATA:  Chest pain following fall several days ago, initial encounter EXAM: CT ANGIOGRAPHY CHEST CT ABDOMEN AND PELVIS WITH CONTRAST TECHNIQUE: Multidetector CT imaging of the chest was performed using the standard protocol during bolus administration of intravenous contrast. Multiplanar CT image reconstructions and MIPs were obtained to evaluate the vascular anatomy. Multidetector CT imaging of the abdomen and pelvis was performed using the standard protocol during bolus administration of intravenous contrast. CONTRAST:  160m ISOVUE-370 IOPAMIDOL (ISOVUE-370) INJECTION 76% COMPARISON:  03/22/2016 CT of the abdomen FINDINGS: CTA CHEST FINDINGS Cardiovascular: Mild dilatation of the ascending aorta 4.1 cm is noted. Normal tapering in the aortic arch is seen. Atherosclerotic calcifications are noted. No findings to suggest dissection are seen. No cardiac enlargement is noted. Diffuse coronary calcifications are seen. The pulmonary artery demonstrates a normal branching pattern without intraluminal filling defect to suggest pulmonary embolism. Mediastinum/Nodes: Thoracic inlet is within normal limits. No sizable hilar or mediastinal adenopathy is noted. The esophagus as visualized is within normal limits. Lungs/Pleura: Lungs are well  aerated bilaterally. No focal infiltrate or sizable effusion is seen. No sizable parenchymal nodules are noted. Musculoskeletal: Degenerative changes of the thoracic spine are seen. No acute rib fracture or compression deformity is seen. There is a fracture through the spinous process of T8 which is better visualized on the CT of the thoracic spine. Review of the MIP images confirms the above findings. CT ABDOMEN and PELVIS FINDINGS Hepatobiliary: Diffuse fatty infiltration of the liver is noted. The gallbladder is well distended and demonstrates a single focus of non dependent air. This is likely related to prior instrumentation and sphincterotomy and has decreased significantly in the interval from the prior CT examination. Pancreas: Unremarkable. No pancreatic ductal dilatation or surrounding inflammatory  changes. Spleen: Normal in size without focal abnormality. Adrenals/Urinary Tract: Adrenal glands are within normal limits. Kidneys demonstrate a normal enhancement pattern bilaterally. Small renal cyst is noted in the lower pole of the right kidney. Bladder is partially distended. Stomach/Bowel: There are changes consistent with left mid abdominal ostomy stable from the prior exam. No obstructive or inflammatory changes of the bowel are noted. The appendix is within normal limits. Vascular/Lymphatic: Aortic atherosclerosis. No enlarged abdominal or pelvic lymph nodes. Reproductive: Prostate is unremarkable. Other: Post treatment changes are noted anterior to the sacrum and stable in appearance from the prior exam. No ascites is noted. No free air is seen. Musculoskeletal: Degenerative changes of the lumbar spine are noted. Review of the MIP images confirms the above findings. IMPRESSION: CTA of the chest: No evidence of pulmonary emboli. T8 spinous process fracture better visualized on CT of the thoracic spine. Dilatation of the ascending aorta to 4.1 cm. Recommend annual imaging followup by CTA or MRA. This  recommendation follows 2010 ACCF/AHA/AATS/ACR/ASA/SCA/SCAI/SIR/STS/SVM Guidelines for the Diagnosis and Management of Patients with Thoracic Aortic Disease. Circulation. 2010; 121: [id number] CT of the abdomen and pelvis: Chronic changes similar to that seen on prior exam. No acute abnormality is noted. Electronically Signed   By: Inez Catalina M.D.   On: 10/27/2017 17:27    Review of Systems  Musculoskeletal: Positive for back pain and joint pain.   Blood pressure (!) 144/99, pulse 94, temperature 97.8 F (36.6 C), temperature source Oral, resp. rate 20, weight 82.1 kg, SpO2 96 %. Physical Exam  Constitutional: Ricky Conley is oriented to person, place, and time.  Neurological: Ricky Conley is alert and oriented to person, place, and time. Ricky Conley has normal strength. GCS eye subscore is 4. GCS verbal subscore is 5. GCS motor subscore is 6.  Patient is awake and alert cranial nerves are intact upper extremity strength is 5 out of 5 lower extremity strength is also 5 out of 5 iliopsoas, quads, hamstrings, gastroc, and tibialis, EHL.  Ricky Conley has focal tenderness interscapular around the T7-T8 area    Assessment/Plan: 72 year old gentleman with what appears to be possibly technically a T8 Chance fracture there is a fracture through the endplate of T8 and is also a spinous process fracture pedicles appear to be intact disc space does not appear to be disrupted.  I do think it is possible this is a stable fracture whereas Chance fracture usually unstable however this is not clearly a chance on his T-spine CT.  So I think we need to get an MRI scan of his thoracic spine which I will order to evaluate degree of ligamentous disruption and disc description.  I also will order a brace I recommend admission to medicine to optimize medical comorbidities when Ricky Conley gets an adequate brace we will get an upright x-ray to make sure Ricky Conley holds his alignment.  If Ricky Conley does then Ricky Conley can participate in physical occupational therapy.  Patient is to remain at  bedrest until we clear him after Ricky Conley gets the brace and check the x-ray.  Armando Bukhari P 10/27/2017, 7:40 PM

## 2017-10-27 NOTE — ED Notes (Addendum)
Patient has colostomy bag,  biotech unable to put back brace on.

## 2017-10-27 NOTE — ED Notes (Addendum)
Pt has gone to MRI will get vitals when pt return

## 2017-10-27 NOTE — ED Triage Notes (Signed)
Pt arrives from home. Pt reports that he has had back pain between shoulder blades, more on the right side since a fall on Thursday. Pt states "It hurts when I take a deep breath" Per EMS: Pt has not had a BM since Thursday. Pt has colostomy in place. Pt states "It feels like I need to belch"

## 2017-10-27 NOTE — ED Notes (Signed)
Bed: WA03 Expected date:  Expected time:  Means of arrival:  Comments: 1 M FALL, BACK PAIN, ABD PAIN

## 2017-10-27 NOTE — ED Notes (Signed)
Ricky Foster MD made aware that pts CT have resulted and Pt is requesting to speak to MD about results

## 2017-10-27 NOTE — ED Notes (Signed)
ED Provider at bedside. 

## 2017-10-27 NOTE — ED Provider Notes (Signed)
[facility] DEPT Provider Note   CSN: [id number] Arrival date & time: 10/27/17  1208     History   Chief Complaint Chief Complaint  Patient presents with  . Back Pain    HPI Ricky Conley is a 72 y.o. male.  72 year old male with past medical history including vascular dementia, bipolar disorder, ascending aortic aneurysm, type 2 diabetes mellitus, colon cancer, hypertension who presents with back pain and constipation.  The patient presented to the ED 4 days ago after a fall during which he did not hit his head or lose consciousness but he did start having low back pain that now radiates up to his right thoracic back between his shoulder blades.  For the past few days he has had pain with deep breathing.  He denies any associated shortness of breath or cough.  He also notes that he has not had a bowel movement since his fall 4 days ago.  Wife states that this is highly unusual because he usually has multiple bowel movements per day with his colostomy.  No vomiting.  He notes some abdominal distention and discomfort.  No medications tried for his constipation.  He has taken Tylenol for his pain without much relief.  LEVEL5 CAVEAT DUE TO DEMENTIA  The history is provided by the patient and the spouse.  Back Pain      Past Medical History:  Diagnosis Date  . Anxiety   . Arthritis   . Ascending aortic aneurysm (HCC)    Dr. Cyndia Bent follows- last check 2 yrs.  . Bipolar disorder ([place])    tx Depakote  . Colon cancer [facility]) 2001   Chemotherapy, radiation- no longer seeing oncology  . Depression   . Diabetes mellitus without complication ([place])    Type II " controlled with diet"  . Diet-controlled diabetes mellitus ([place])   . Gallstones, common bile duct   . Hyperlipidemia   . Hypertension   . Impaired hearing    wears hearing aid left ear.  . Neuromuscular disorder ([place])    rectal nerve damage' oversewed" chronic pain tx. Gabapentin.  . Rectal cancer  ([place])   . Subcortical vascular dementia     Patient Active Problem List   Diagnosis Date Noted  . 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 Frequent falls 07/05/2015  . Acute encephalopathy 06/29/2015  . Encephalopathy 06/29/2015  . Choledocholithiasis   . Dilated bile duct   . Dementia with behavioral disturbance 06/28/2015  . Generalized anxiety disorder 06/15/2015  . Rectal cancer ([place]) 06/12/2015  . Abnormal CT scan 06/09/2015  . Elevated LFTs 06/09/2015  . Transaminitis 04/20/2015  . Hypotension 04/18/2015  . Weakness generalized 04/18/2015  . Loss of balance 04/18/2015  . Generalized weakness 04/18/2015  . Fever 04/09/2015  . Influenza-like illness 04/09/2015  . Delirium 04/09/2015  . Hypernatremia 02/18/2013  . Altered mental status 02/18/2013  . Bipolar disorder ([place]) 02/18/2013  . 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 Hyperlipidemia 02/18/2013  . Ascending aortic aneurysm ([place]) 08/12/2012    Past Surgical History:  Procedure Laterality Date  . BACK SURGERY  2003  . CARPAL TUNNEL RELEASE Bilateral 2008  . COLECTOMY  2001  . COLONOSCOPY    . COLOSTOMY  2001  . ERCP N/A 06/29/2015   Procedure: ENDOSCOPIC RETROGRADE CHOLANGIOPANCREATOGRAPHY (ERCP);  Surgeon: Ladene Artist, MD;  Location: [facility];  Service: Endoscopy;  Laterality: N/A;  . FRACTURE SURGERY Left 2009   ankle with hardware  Home Medications    Prior to Admission medications   Medication Sig Start Date End Date Taking? Authorizing Provider  acetaminophen (TYLENOL) 500 MG tablet Take 1,000 mg by mouth every 8 (eight) hours as needed for moderate pain.   Yes [provider]  amLODipine (NORVASC) 5 MG tablet Take 1 tablet (5 mg total) by mouth daily. 04/12/15  Yes Velvet Bathe, MD  aspirin 81 MG EC tablet Take 81 mg by mouth every morning.    Yes [provider]  atorvastatin (LIPITOR) 20 MG tablet Take 1 tablet by mouth daily. 03/12/16  Yes [provider]    buPROPion (WELLBUTRIN XL) 300 MG 24 hr tablet Take 300 mg by mouth every morning.   Yes [provider]  cholecalciferol (VITAMIN D) 1000 units tablet Take 1,000 Units by mouth daily.   Yes [provider]  clonazePAM (KLONOPIN) 0.5 MG tablet Take 0.5 mg by mouth. One in the morning, two at night   Yes [provider]  divalproex (DEPAKOTE) 500 MG DR tablet Take 500 mg by mouth 3 (three) times daily. 10/14/17  Yes [provider]  gabapentin (NEURONTIN) 300 MG capsule Take 300 mg by mouth 3 (three) times daily.    Yes [provider]  memantine (NAMENDA) 10 MG tablet Take 10 mg by mouth 2 (two) times daily. 10/21/17  Yes [provider]  metFORMIN (GLUCOPHAGE) 500 MG tablet Take 500 mg by mouth daily. 10/18/17  Yes [provider]  trolamine salicylate (ASPERCREME) 10 % cream Apply 1 application topically 3 (three) times daily as needed for muscle pain.   Yes [provider]  omeprazole (PRILOSEC) 20 MG capsule TAKE 1 CAPSULE BY MOUTH ONCE DAILY Patient not taking: Reported on 10/27/2017 01/22/17   Ladene Artist, MD    Family History Family History  Problem Relation Age of Onset  . Stroke Father   . Cancer Maternal Uncle   . Cancer Brother   . Colon cancer Neg Hx   . Esophageal cancer Neg Hx   . Rectal cancer Neg Hx   . Stomach cancer Neg Hx     Social History Social History   Tobacco Use  . Smoking status: Former Smoker    Last attempt to quit: 05/30/1970    Years since quitting: 47.4  . Smokeless tobacco: Former Systems developer    Quit date: 05/30/1998  . Tobacco comment: quit chewing tobacco approx 2012  Substance Use Topics  . Alcohol use: No    Alcohol/week: 0.0 standard drinks  . Drug use: No     Allergies   Patient has no known allergies.   Review of Systems Review of Systems  Unable to perform ROS: Dementia  Musculoskeletal: Positive for back pain.     Physical Exam Updated Vital Signs BP (!)  169/91   Pulse 94   Temp 97.8 F (36.6 C) (Oral)   Resp (!) 22   Wt 82.1 kg   SpO2 94%   BMI 25.97 kg/m   Physical Exam  Constitutional: He is oriented to person, place, and time. He appears well-developed and well-nourished. No distress.  HENT:  Head: Normocephalic and atraumatic.  Moist mucous membranes  Eyes: Pupils are equal, round, and reactive to light. Conjunctivae are normal.  Neck: Neck supple.  Cardiovascular: Normal rate, regular rhythm and normal heart sounds.  No murmur heard. Pulmonary/Chest: Effort normal and breath sounds normal. He exhibits tenderness (R lateral chest wall).  Abdominal: Soft. Bowel sounds are normal. He exhibits  distension. There is tenderness (Mild generalized).  No stool in colostomy bag  Musculoskeletal: He exhibits no edema.  Neurological: He is alert and oriented to person, place, and time.  Fluent speech  Skin: Skin is warm and dry.  Psychiatric:  Calm, cooperative  Nursing note and vitals reviewed.    ED Treatments / Results  Labs (all labs ordered are listed, but only abnormal results are displayed) Labs Reviewed  COMPREHENSIVE METABOLIC PANEL - Abnormal; Notable for the following components:      Result Value   Glucose, Bld 150 (*)    BUN 24 (*)    All other components within normal limits  CBC WITH DIFFERENTIAL/PLATELET - Abnormal; Notable for the following components:   Monocytes Absolute 1.2 (*)    All other components within normal limits  URINALYSIS, ROUTINE W REFLEX MICROSCOPIC - Abnormal; Notable for the following components:   Glucose, UA 50 (*)    Ketones, ur 5 (*)    All other components within normal limits  LIPASE, BLOOD    EKG None  Radiology No results found.  Procedures Procedures (including critical care time)  Medications Ordered in ED Medications - No data to display   Initial Impression / Assessment and Plan / ED Course  I have reviewed the triage vital signs and the nursing  notes.  Pertinent labs & imaging results that were available during my care of the patient were reviewed by me and considered in my medical decision making (see chart for details).     Patient well-appearing with reassuring vital signs on exam.  His lab work is overall unremarkable.  I am concerned about his constipation and abdominal distention, have obtain CT of abdomen and pelvis to evaluate for obstruction.  Have also obtained CT chest, thoracic, and lumbar spine to evaluate for acute injury.  I am signing out to the oncoming provider who will follow up on imaging. Final Clinical Impressions(s) / ED Diagnoses   Final diagnoses:  None    ED Discharge Orders    None       Yolanda Dockendorf, Wenda Overland, MD 10/27/17 [phone]

## 2017-10-27 NOTE — H&P (Signed)
[name] HGD:[id number] DOB: Jun 28, 1945 DOA: 10/27/2017     PCP: Shirline Frees, MD   Outpatient Specialists:     NEurology    Dr. Carles Collet   GI  Dr. Fuller Plan    Patient arrived to ER on 10/27/17 at 1208  Patient coming from: home Lives   With family    Chief Complaint:  Chief Complaint  Patient presents with  . Back Pain    HPI: Ricky Conley is a 72 y.o. male with medical history significant of Lewy body dementia as well as colorectal cancer with colostomy bag placed, lumbar spinal stenosis, Ascending aortic aneurysm, DM 2, HTN, HLD    Presented with multiple falls up to 16 times over past month.  Is been having repeated falls this time states he has fallen earlier today did not hit his head no LOC he has been having significant low back pain radiating to his thoracic area and shoulder blades.  Is been getting progressively worse no associated shortness of breath or cough Constipation no BM for the past 4 days. No nausea or vomiting  Vomiting no chest pain he has not tried to use anything for constipation  Regarding pertinent Chronic problems: Lewy Body dementia with confusion    While in ER: CTA no PE CT showed mid thoracic fracture T8 spinous process fracture Seen by neurosurgery who recommends admission to medicine TLSO brace and if clears and spine appears to be stable will need physical therapy  The following Work up has been ordered so far:  Orders Placed This Encounter  Procedures  . CT Angio Chest PE W/Cm &/Or Wo Cm  . CT Abdomen Pelvis W Contrast  . CT THORACIC SPINE WO CONTRAST  . CT Lumbar Spine Wo Contrast  . MR THORACIC SPINE WO CONTRAST  . Comprehensive metabolic panel  . CBC with Differential  . Lipase, blood  . Urinalysis, Routine w reflex microscopic  . Diet NPO time specified  . Outside Vendor Brace  . Bed rest  . Consult to neurosurgery  . Consult to hospitalist  . Saline lock IV    Following Medications were ordered in ER: Medications    iopamidol (ISOVUE-370) 76 % injection (has no administration in time range)  iopamidol (ISOVUE-370) 76 % injection 100 mL (100 mLs Intravenous Contrast Given 10/27/17 1623)    Significant initial  Findings: Abnormal Labs Reviewed  COMPREHENSIVE METABOLIC PANEL - Abnormal; Notable for the following components:      Result Value   Glucose, Bld 150 (*)    BUN 24 (*)    All other components within normal limits  CBC WITH DIFFERENTIAL/PLATELET - Abnormal; Notable for the following components:   Monocytes Absolute 1.2 (*)    All other components within normal limits  URINALYSIS, ROUTINE W REFLEX MICROSCOPIC - Abnormal; Notable for the following components:   Glucose, UA 50 (*)    Ketones, ur 5 (*)    All other components within normal limits     Na 141 K 4.7  Cr   Stable,  Lab Results  Component Value Date   CREATININE 0.95 10/27/2017   CREATININE 1.14 02/19/2016   CREATININE 0.74 06/30/2015      WBC 7.1  HG/HCT  stable,      Component Value Date/Time   HGB 14.1 10/27/2017 1424   HGB 12.7 (L) 06/15/2007 0950   HCT 43.6 10/27/2017 1424   HCT 36.3 (L) 06/15/2007 0950        UA  no  evidence of UTI       ECG:  Not obtained  ED Triage Vitals  Enc Vitals Group     BP 10/27/17 1223 (!) 143/103     Pulse Rate 10/27/17 1223 93     Resp 10/27/17 1223 18     Temp 10/27/17 1223 97.8 F (36.6 C)     Temp Source 10/27/17 1223 Oral     SpO2 10/27/17 1218 95 %     Weight 10/27/17 1224 181 lb (82.1 kg)     Height --      Head Circumference --      Peak Flow --      Pain Score 10/27/17 1224 8     Pain Loc --      Pain Edu? --      Excl. in [place]? --   TMAX(24)@       Latest  Blood pressure (!) 144/99, pulse 94, temperature 97.8 F (36.6 C), temperature source Oral, resp. rate 20, weight 82.1 kg, SpO2 96 %.     ER Provider Called: NS     Dr.CRAM They Recommend get an MRI scan of his thoracic spine, TLSO brace Will see  in ER  Hospitalist was called for admission  for T8 compression fracture   Review of Systems:    Pertinent positives include: fatigue, recurrent falls  Constitutional:  No weight loss, night sweats, Fevers, chills,  weight loss  HEENT:  No headaches, Difficulty swallowing,Tooth/dental problems,Sore throat,  No sneezing, itching, ear ache, nasal congestion, post nasal drip,  Cardio-vascular:  No chest pain, Orthopnea, PND, anasarca, dizziness, palpitations.no Bilateral lower extremity swelling  GI:  No heartburn, indigestion, abdominal pain, nausea, vomiting, diarrhea, change in bowel habits, loss of appetite, melena, blood in stool, hematemesis Resp:  no shortness of breath at rest. No dyspnea on exertion, No excess mucus, no productive cough, No non-productive cough, No coughing up of blood.No change in color of mucus.No wheezing. Skin:  no rash or lesions. No jaundice GU:  no dysuria, change in color of urine, no urgency or frequency. No straining to urinate.  No flank pain.  Musculoskeletal:  No joint pain or no joint swelling. No decreased range of motion. No back pain.  Psych:  No change in mood or affect. No depression or anxiety. No memory loss.  Neuro: no localizing neurological complaints, no tingling, no weakness, no double vision, no gait abnormality, no slurred speech, no confusion  All systems reviewed and apart from [place] all are negative  Past Medical History:   Past Medical History:  Diagnosis Date  . Anxiety   . Arthritis   . Ascending aortic aneurysm (HCC)    Dr. Cyndia Bent follows- last check 2 yrs.  . Bipolar disorder ([place])    tx Depakote  . Colon cancer [facility]) 2001   Chemotherapy, radiation- no longer seeing oncology  . Depression   . Diabetes mellitus without complication ([place])    Type II " controlled with diet"  . Diet-controlled diabetes mellitus ([place])   . Gallstones, common bile duct   . Hyperlipidemia   . Hypertension   . Impaired hearing    wears hearing aid left ear.  . Neuromuscular  disorder ([place])    rectal nerve damage' oversewed" chronic pain tx. Gabapentin.  . Rectal cancer ([place])   . Subcortical vascular dementia       Past Surgical History:  Procedure Laterality Date  . BACK SURGERY  2003  . CARPAL TUNNEL RELEASE Bilateral 2008  .  COLECTOMY  2001  . COLONOSCOPY    . COLOSTOMY  2001  . ERCP N/A 06/29/2015   Procedure: ENDOSCOPIC RETROGRADE CHOLANGIOPANCREATOGRAPHY (ERCP);  Surgeon: Ladene Artist, MD;  Location: [facility];  Service: Endoscopy;  Laterality: N/A;  . FRACTURE SURGERY Left 2009   ankle with hardware    Social History:  Ambulatory walker or wheelchair bound      reports that he quit smoking about 47 years ago. He quit smokeless tobacco use about 19 years ago. He reports that he does not drink alcohol or use drugs.     Family History:   Family History  Problem Relation Age of Onset  . Stroke Father   . Cancer Maternal Uncle   . Cancer Brother   . Colon cancer Neg Hx   . Esophageal cancer Neg Hx   . Rectal cancer Neg Hx   . Stomach cancer Neg Hx     Allergies: No Known Allergies   Prior to Admission medications   Medication Sig Start Date End Date Taking? Authorizing Provider  acetaminophen (TYLENOL) 500 MG tablet Take 1,000 mg by mouth every 8 (eight) hours as needed for moderate pain.   Yes [provider]  amLODipine (NORVASC) 5 MG tablet Take 1 tablet (5 mg total) by mouth daily. 04/12/15  Yes Velvet Bathe, MD  aspirin 81 MG EC tablet Take 81 mg by mouth every morning.    Yes [provider]  atorvastatin (LIPITOR) 20 MG tablet Take 1 tablet by mouth daily. 03/12/16  Yes [provider]  buPROPion (WELLBUTRIN XL) 300 MG 24 hr tablet Take 300 mg by mouth every morning.   Yes [provider]  cholecalciferol (VITAMIN D) 1000 units tablet Take 1,000 Units by mouth daily.   Yes [provider]  clonazePAM (KLONOPIN) 0.5 MG tablet Take 0.5 mg by mouth. One in the morning, two at night    Yes [provider]  divalproex (DEPAKOTE) 500 MG DR tablet Take 500 mg by mouth 3 (three) times daily. 10/14/17  Yes [provider]  gabapentin (NEURONTIN) 300 MG capsule Take 300 mg by mouth 3 (three) times daily.    Yes [provider]  memantine (NAMENDA) 10 MG tablet Take 10 mg by mouth 2 (two) times daily. 10/21/17  Yes [provider]  metFORMIN (GLUCOPHAGE) 500 MG tablet Take 500 mg by mouth daily. 10/18/17  Yes [provider]  trolamine salicylate (ASPERCREME) 10 % cream Apply 1 application topically 3 (three) times daily as needed for muscle pain.   Yes [provider]  omeprazole (PRILOSEC) 20 MG capsule TAKE 1 CAPSULE BY MOUTH ONCE DAILY Patient not taking: Reported on 10/27/2017 01/22/17   Ladene Artist, MD   Physical Exam: Blood pressure (!) 144/99, pulse 94, temperature 97.8 F (36.6 C), temperature source Oral, resp. rate 20, weight 82.1 kg, SpO2 96 %. 1. General:  in No Acute distress  well   -appearing 2. Psychological: Alert and   Oriented to self 3. Head/ENT:    Dry Mucous Membranes                          Head Non traumatic, neck supple                            Poor Dentition 4. SKIN:   decreased Skin turgor,  Skin clean Dry and intact no rash 5. Heart: Regular  rate and rhythm no Murmur, no Rub or gallop 6. Lungs:   no wheezes or crackles   7. Abdomen: Soft,  non-tender,   distended  bowel sounds present hyperactive 8. Lower extremities: no clubbing, cyanosis, or  edema 9. Neurologically Grossly intact, moving all 4 extremities equally  10. MSK: Normal range of motion   LABS:     Recent Labs  Lab 10/27/17 1424  WBC 7.1  NEUTROABS 4.1  HGB 14.1  HCT 43.6  MCV 92.8  PLT 161   Basic Metabolic Panel: Recent Labs  Lab 10/27/17 1424  NA 141  K 4.7  CL 101  CO2 30  GLUCOSE 150*  BUN 24*  CREATININE 0.95  CALCIUM 9.3      Recent Labs  Lab 10/27/17 1424  AST 21  ALT 20  ALKPHOS 125    BILITOT 1.2  PROT 7.2  ALBUMIN 3.5   Recent Labs  Lab 10/27/17 1424  LIPASE 31   No results for input(s): AMMONIA in the last 168 hours.    HbA1C: No results for input(s): HGBA1C in the last 72 hours. CBG: No results for input(s): GLUCAP in the last 168 hours.    Urine analysis:    Component Value Date/Time   COLORURINE YELLOW 10/27/2017 1424   APPEARANCEUR CLEAR 10/27/2017 1424   LABSPEC 1.029 10/27/2017 1424   PHURINE 5.0 10/27/2017 1424   GLUCOSEU 50 (A) 10/27/2017 1424   HGBUR NEGATIVE 10/27/2017 1424   BILIRUBINUR NEGATIVE 10/27/2017 1424   KETONESUR 5 (A) 10/27/2017 1424   PROTEINUR NEGATIVE 10/27/2017 1424   UROBILINOGEN 2.0 (H) 02/18/2013 1611   NITRITE NEGATIVE 10/27/2017 1424   LEUKOCYTESUR NEGATIVE 10/27/2017 1424      Cultures:    Component Value Date/Time   SDES BLOOD LEFT HAND 06/29/2015 1705   SPECREQUEST BOTTLES DRAWN AEROBIC AND ANAEROBIC 10 CC EA 06/29/2015 1705   CULT  06/29/2015 1705    NO GROWTH 5 DAYS Performed at [place] 07/04/2015 FINAL 06/29/2015 1705     Radiological Exams on Admission: Ct Angio Chest Pe W/cm &/or Wo Cm  Result Date: 10/27/2017 CLINICAL DATA:  Chest pain following fall several days ago, initial encounter EXAM: CT ANGIOGRAPHY CHEST CT ABDOMEN AND PELVIS WITH CONTRAST TECHNIQUE: Multidetector CT imaging of the chest was performed using the standard protocol during bolus administration of intravenous contrast. Multiplanar CT image reconstructions and MIPs were obtained to evaluate the vascular anatomy. Multidetector CT imaging of the abdomen and pelvis was performed using the standard protocol during bolus administration of intravenous contrast. CONTRAST:  119mL ISOVUE-370 IOPAMIDOL (ISOVUE-370) INJECTION 76% COMPARISON:  03/22/2016 CT of the abdomen FINDINGS: CTA CHEST FINDINGS Cardiovascular: Mild dilatation of the ascending aorta 4.1 cm is noted. Normal tapering in the aortic arch is seen.  Atherosclerotic calcifications are noted. No findings to suggest dissection are seen. No cardiac enlargement is noted. Diffuse coronary calcifications are seen. The pulmonary artery demonstrates a normal branching pattern without intraluminal filling defect to suggest pulmonary embolism. Mediastinum/Nodes: Thoracic inlet is within normal limits. No sizable hilar or mediastinal adenopathy is noted. The esophagus as visualized is within normal limits. Lungs/Pleura: Lungs are well aerated bilaterally. No focal infiltrate or sizable effusion is seen. No sizable parenchymal nodules are noted. Musculoskeletal: Degenerative changes of the thoracic spine are seen. No acute rib fracture or compression deformity is seen. There is a fracture through the spinous process of T8 which is better visualized on the CT of the thoracic spine. Review  of the MIP images confirms the above findings. CT ABDOMEN and PELVIS FINDINGS Hepatobiliary: Diffuse fatty infiltration of the liver is noted. The gallbladder is well distended and demonstrates a single focus of non dependent air. This is likely related to prior instrumentation and sphincterotomy and has decreased significantly in the interval from the prior CT examination. Pancreas: Unremarkable. No pancreatic ductal dilatation or surrounding inflammatory changes. Spleen: Normal in size without focal abnormality. Adrenals/Urinary Tract: Adrenal glands are within normal limits. Kidneys demonstrate a normal enhancement pattern bilaterally. Small renal cyst is noted in the lower pole of the right kidney. Bladder is partially distended. Stomach/Bowel: There are changes consistent with left mid abdominal ostomy stable from the prior exam. No obstructive or inflammatory changes of the bowel are noted. The appendix is within normal limits. Vascular/Lymphatic: Aortic atherosclerosis. No enlarged abdominal or pelvic lymph nodes. Reproductive: Prostate is unremarkable. Other: Post treatment changes  are noted anterior to the sacrum and stable in appearance from the prior exam. No ascites is noted. No free air is seen. Musculoskeletal: Degenerative changes of the lumbar spine are noted. Review of the MIP images confirms the above findings. IMPRESSION: CTA of the chest: No evidence of pulmonary emboli. T8 spinous process fracture better visualized on CT of the thoracic spine. Dilatation of the ascending aorta to 4.1 cm. Recommend annual imaging followup by CTA or MRA. This recommendation follows 2010 ACCF/AHA/AATS/ACR/ASA/SCA/SCAI/SIR/STS/SVM Guidelines for the Diagnosis and Management of Patients with Thoracic Aortic Disease. Circulation. 2010; 121: Z610-R604 CT of the abdomen and pelvis: Chronic changes similar to that seen on prior exam. No acute abnormality is noted. Electronically Signed   By: Inez Catalina M.D.   On: 10/27/2017 17:27   Ct Thoracic Spine Wo Contrast  Result Date: 10/27/2017 CLINICAL DATA:  Dementia patient status post fall with persistent back pain EXAM: CT THORACIC AND LUMBAR SPINE WITHOUT CONTRAST TECHNIQUE: Multidetector CT imaging of the thoracic and lumbar spine was performed without contrast. Multiplanar CT image reconstructions were also generated. COMPARISON:  Radiography 10/23/2017 FINDINGS: CT THORACIC SPINE FINDINGS Alignment: Normal Vertebrae: Solid bridging osteophytes throughout the entire thoracic region extending to L1. Nondisplaced fracture of the spinous process of T8. Fracture extends horizontally through the T7-8 disc level. No displacement. No canal compromise. Paraspinal and other soft tissues: Negative Disc levels: No stenosis of the canal or foramina. Small dural calcification posteriorly at T10. Not likely significant. This was present in 2017. CT LUMBAR SPINE FINDINGS Segmentation: 5 lumbar type vertebral bodies. Alignment: Normal except for 2 mm anterolisthesis L4-5. Vertebrae: No lumbosacral fracture in the region studied. Paraspinal and other soft tissues:  Negative Disc levels: Moderate multifactorial stenosis at L2-3, L3-4. At L4-5, there is severe multifactorial stenosis. At L5-S1 there is stenosis of the lateral recesses and foramina. Ankylosis of the sacroiliac joints. IMPRESSION: CT THORACIC SPINE IMPRESSION Fracture of the spinous process of T8, nondisplaced. Fracture extends transversely through the T7-8 disc level, Chance fracture equivalent. No displacement. No canal compromise. No unstable fracture seen. 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 CT LUMBAR SPINE IMPRESSION No lumbar spine fracture. 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 Bilateral lateral recess and foraminal stenosis at L5-S1. Mild to moderate canal stenosis at L2-3 and L3-4. Electronically Signed   By: Nelson Chimes M.D.   On: 10/27/2017 17:17   Ct Lumbar Spine Wo Contrast  Result Date: 10/27/2017 CLINICAL DATA:  Dementia patient status post fall with persistent back pain EXAM: CT THORACIC AND LUMBAR SPINE WITHOUT CONTRAST TECHNIQUE: Multidetector CT imaging of the  thoracic and lumbar spine was performed without contrast. Multiplanar CT image reconstructions were also generated. COMPARISON:  Radiography 10/23/2017 FINDINGS: CT THORACIC SPINE FINDINGS Alignment: Normal Vertebrae: Solid bridging osteophytes throughout the entire thoracic region extending to L1. Nondisplaced fracture of the spinous process of T8. Fracture extends horizontally through the T7-8 disc level. No displacement. No canal compromise. Paraspinal and other soft tissues: Negative Disc levels: No stenosis of the canal or foramina. Small dural calcification posteriorly at T10. Not likely significant. This was present in 2017. CT LUMBAR SPINE FINDINGS Segmentation: 5 lumbar type vertebral bodies. Alignment: Normal except for 2 mm anterolisthesis L4-5. Vertebrae: No lumbosacral fracture in the region studied. Paraspinal and other soft tissues: Negative Disc levels: Moderate multifactorial stenosis at L2-3, L3-4. At L4-5,  there is severe multifactorial stenosis. At L5-S1 there is stenosis of the lateral recesses and foramina. Ankylosis of the sacroiliac joints. IMPRESSION: CT THORACIC SPINE IMPRESSION Fracture of the spinous process of T8, nondisplaced. Fracture extends transversely through the T7-8 disc level, Chance fracture equivalent. No displacement. No canal compromise. No unstable fracture seen. 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 CT LUMBAR SPINE IMPRESSION No lumbar spine fracture. 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 Bilateral lateral recess and foraminal stenosis at L5-S1. Mild to moderate canal stenosis at L2-3 and L3-4. Electronically Signed   By: Nelson Chimes M.D.   On: 10/27/2017 17:17   Ct Abdomen Pelvis W Contrast  Result Date: 10/27/2017 CLINICAL DATA:  Chest pain following fall several days ago, initial encounter EXAM: CT ANGIOGRAPHY CHEST CT ABDOMEN AND PELVIS WITH CONTRAST TECHNIQUE: Multidetector CT imaging of the chest was performed using the standard protocol during bolus administration of intravenous contrast. Multiplanar CT image reconstructions and MIPs were obtained to evaluate the vascular anatomy. Multidetector CT imaging of the abdomen and pelvis was performed using the standard protocol during bolus administration of intravenous contrast. CONTRAST:  149mL ISOVUE-370 IOPAMIDOL (ISOVUE-370) INJECTION 76% COMPARISON:  03/22/2016 CT of the abdomen FINDINGS: CTA CHEST FINDINGS Cardiovascular: Mild dilatation of the ascending aorta 4.1 cm is noted. Normal tapering in the aortic arch is seen. Atherosclerotic calcifications are noted. No findings to suggest dissection are seen. No cardiac enlargement is noted. Diffuse coronary calcifications are seen. The pulmonary artery demonstrates a normal branching pattern without intraluminal filling defect to suggest pulmonary embolism. Mediastinum/Nodes: Thoracic inlet is within normal limits. No sizable hilar or mediastinal adenopathy is noted. The  esophagus as visualized is within normal limits. Lungs/Pleura: Lungs are well aerated bilaterally. No focal infiltrate or sizable effusion is seen. No sizable parenchymal nodules are noted. Musculoskeletal: Degenerative changes of the thoracic spine are seen. No acute rib fracture or compression deformity is seen. There is a fracture through the spinous process of T8 which is better visualized on the CT of the thoracic spine. Review of the MIP images confirms the above findings. CT ABDOMEN and PELVIS FINDINGS Hepatobiliary: Diffuse fatty infiltration of the liver is noted. The gallbladder is well distended and demonstrates a single focus of non dependent air. This is likely related to prior instrumentation and sphincterotomy and has decreased significantly in the interval from the prior CT examination. Pancreas: Unremarkable. No pancreatic ductal dilatation or surrounding inflammatory changes. Spleen: Normal in size without focal abnormality. Adrenals/Urinary Tract: Adrenal glands are within normal limits. Kidneys demonstrate a normal enhancement pattern bilaterally. Small renal cyst is noted in the lower pole of the right kidney. Bladder is partially distended. Stomach/Bowel: There are changes consistent with left mid abdominal ostomy stable from the prior  exam. No obstructive or inflammatory changes of the bowel are noted. The appendix is within normal limits. Vascular/Lymphatic: Aortic atherosclerosis. No enlarged abdominal or pelvic lymph nodes. Reproductive: Prostate is unremarkable. Other: Post treatment changes are noted anterior to the sacrum and stable in appearance from the prior exam. No ascites is noted. No free air is seen. Musculoskeletal: Degenerative changes of the lumbar spine are noted. Review of the MIP images confirms the above findings. IMPRESSION: CTA of the chest: No evidence of pulmonary emboli. T8 spinous process fracture better visualized on CT of the thoracic spine. Dilatation of the  ascending aorta to 4.1 cm. Recommend annual imaging followup by CTA or MRA. This recommendation follows 2010 ACCF/AHA/AATS/ACR/ASA/SCA/SCAI/SIR/STS/SVM Guidelines for the Diagnosis and Management of Patients with Thoracic Aortic Disease. Circulation. 2010; 121: [id number] CT of the abdomen and pelvis: Chronic changes similar to that seen on prior exam. No acute abnormality is noted. Electronically Signed   By: Inez Catalina M.D.   On: 10/27/2017 17:27    Chart has been reviewed    Assessment/Plan   72 y.o. male with medical history significant of Lewy body dementia as well as colorectal cancer with colostomy bag placed, lumbar spinal stenosis, Ascending aortic aneurysm, DM 2, HTN, HLD   Admitted for T8 fracture  Present on Admission: . T8 vertebral fracture (HCC) -will need special brace in a.m. due to history of colostomy.  Bedrest.  Appreciate neurosurgical consult.  Pain management.  Defer to neurosurgery when they feel that PT OT is safe to perform . Bipolar disorder ([place]) continue home medications currently stable . Ascending aortic aneurysm ([place]) we will need follow-up as an outpatient . Dementia with behavioral disturbance watch for sundowning and continue home medications . Hyperlipidemia stable continue home medications   . Constipation decreased bowel movements and abdominal distention patient status post colostomy.  Suspect pain induced constipation will order bowel regimen  DM 2 -  - Order Sensitive  SSI     -  check TSH and HgA1C  - Hold by mouth medications     Other plan as per orders.  DVT prophylaxis:  SCD    Code Status:  FULL CODE as per patient   I had personally discussed CODE STATUS with patient    Family Communication:   Family not  at  Bedside    Disposition Plan:    likely will need placement for rehabilitation                                          Would benefit from PT/OT eval prior to DC  defer to NS when safe to order                                      Consults called: NS Admission status: Obs    Level of care        medical floor        Toy Baker 10/27/2017, 10:27 PM   [facility]  Pager [phone]   after 2 AM please page floor coverage PA If 7AM-7PM, please contact the day team taking care of the patient  [email or web address]  Password [email or web address]

## 2017-10-27 NOTE — ED Provider Notes (Signed)
18: 44-patient checked out to me to evaluate CT imaging, then consider discharge.  He has had persistent chest discomfort following a fall 4 days ago.  He is also having decreased stooling per his ostomy.    Clinical Course as of Oct 27 1852  Mon Oct 27, 2017  9628 Normal  Lipase, blood [EW]  1847 Normal  CBC with Differential(!) [EW]  1847 Normal except glucose high, BUN high  Comprehensive metabolic panel(!) [EW]  3662 Normal  Urinalysis, Routine w reflex microscopic(!) [EW]  1847 CT Angio Chest PE W/Cm &/Or Wo Cm [EW]  9476 Images reviewed by me, CT anterior chest, CT abdomen, CT thoracic spine, CT lumbar spine.  No visceral injury.  Fracture T8 spinous process, extending through the T7/T8 disc level.   [EW]  1852 CT Angio Chest PE W/Cm &/Or Wo Cm [EW]    Clinical Course User Index [EW] Daleen Bo, MD    7:10 PM-case discussed with neurosurgery, Dr. Saintclair Halsted, who advises that the patient needs to be admitted, he will see the patient, order MRI, and get him fitted for a TLSO brace.  Patient will require hospitalist admission.  Medical decision making-fall with spine injury, somewhat unstable fracture, essentially a chance fracture.  Possible associated decreased stooling, however initial images do not show clear spinal canal compromise.  Patient will require stabilization in hospital setting.  He may ultimately require rehab.  7:25 PM-Consult complete with hospitalist. Patient case explained and discussed.  They agree to admit patient for further evaluation and treatment. Call ended at 7:40 PM     Daleen Bo, MD 10/30/17 2154

## 2017-10-27 NOTE — ED Notes (Signed)
ED TO INPATIENT HANDOFF REPORT  Name/Age/Gender Ricky Conley 72 y.o. male  Code Status Code Status History    Date Active Date Inactive Code Status Order ID Comments User Context   06/29/2015 1752 06/30/2015 1454 Full Code [id number]  Jonetta Osgood, MD Inpatient   04/18/2015 1917 04/21/2015 1459 Partial Code [id number]  Willia Craze, NP Inpatient   04/09/2015 2310 04/12/2015 1914 Full Code [id number]  Etta Quill, DO ED   02/18/2013 1754 02/19/2013 1609 Full Code [id number]  Aldean [name], MD Inpatient      Home/SNF/Other Home  Chief Complaint fall/back and abd pain  Level of Care/Admitting Diagnosis ED Disposition    ED Disposition Condition [facility] Area: [facility] [[id number]]  Level of Care: Med-Surg [16]  Diagnosis: T8 vertebral fracture [facility]) [[id number]]  Admitting Physician: Toy Baker [[id number]]  Attending Physician: Toy Baker [[id number]]  PT Class (Do Not Modify): Observation [104]  PT Acc Code (Do Not Modify): Observation [10022]       Medical History Past Medical History:  Diagnosis Date  . Anxiety   . Arthritis   . Ascending aortic aneurysm (HCC)    Dr. Cyndia Bent follows- last check 2 yrs.  . Bipolar disorder ([place])    tx Depakote  . Colon cancer [facility]) 2001   Chemotherapy, radiation- no longer seeing oncology  . Depression   . Diabetes mellitus without complication ([place])    Type II " controlled with diet"  . Diet-controlled diabetes mellitus ([place])   . Gallstones, common bile duct   . Hyperlipidemia   . Hypertension   . Impaired hearing    wears hearing aid left ear.  . Neuromuscular disorder ([place])    rectal nerve damage' oversewed" chronic pain tx. Gabapentin.  . Rectal cancer ([place])   . Subcortical vascular dementia     Allergies No Known Allergies  IV Location/Drains/Wounds Patient Lines/Drains/Airways Status   Active Line/Drains/Airways    Name:   Placement date:   Placement time:   Site:    Days:   Peripheral IV 10/27/17 Right Antecubital   10/27/17    1428    Antecubital   less than 1   Colostomy LLQ   -    -    LLQ      External Urinary Catheter   04/11/15    1034    -   930   External Urinary Catheter   06/29/15    2245    -   851          Labs/Imaging Results for orders placed or performed during the hospital encounter of 10/27/17 (from the past 48 hour(s))  Comprehensive metabolic panel     Status: Abnormal   Collection Time: 10/27/17  2:24 PM  Result Value Ref Range   Sodium 141 135 - 145 mmol/L   Potassium 4.7 3.5 - 5.1 mmol/L   Chloride 101 98 - 111 mmol/L   CO2 30 22 - 32 mmol/L   Glucose, Bld 150 (H) 70 - 99 mg/dL   BUN 24 (H) 8 - 23 mg/dL   Creatinine, Ser 0.95 0.61 - 1.24 mg/dL   Calcium 9.3 8.9 - 10.3 mg/dL   Total Protein 7.2 6.5 - 8.1 g/dL   Albumin 3.5 3.5 - 5.0 g/dL   AST 21 15 - 41 U/L   ALT 20 0 - 44 U/L   Alkaline Phosphatase 125 38 - 126 U/L   Total Bilirubin 1.2 0.3 -  1.2 mg/dL   GFR calc non Af Amer >60 >60 mL/min   GFR calc Af Amer >60 >60 mL/min    Comment: (NOTE) The eGFR has been calculated using the CKD EPI equation. This calculation has not been validated in all clinical situations. eGFR's persistently <60 mL/min signify possible Chronic Kidney Disease.    Anion gap 10 5 - 15    Comment: Performed at [facility], [place] [address], [place], [place] [address]  CBC with Differential     Status: Abnormal   Collection Time: 10/27/17  2:24 PM  Result Value Ref Range   WBC 7.1 4.0 - 10.5 K/uL   RBC 4.70 4.22 - 5.81 MIL/uL   Hemoglobin 14.1 13.0 - 17.0 g/dL   HCT 43.6 39.0 - 52.0 %   MCV 92.8 78.0 - 100.0 fL   MCH 30.0 26.0 - 34.0 pg   MCHC 32.3 30.0 - 36.0 g/dL   RDW 13.8 11.5 - 15.5 %   Platelets 161 150 - 400 K/uL   Neutrophils Relative % 59 %   Neutro Abs 4.1 1.7 - 7.7 K/uL   Lymphocytes Relative 24 %   Lymphs Abs 1.7 0.7 - 4.0 K/uL   Monocytes Relative 16 %   Monocytes Absolute 1.2 (H) 0.1 - 1.0 K/uL    Eosinophils Relative 1 %   Eosinophils Absolute 0.1 0.0 - 0.7 K/uL   Basophils Relative 0 %   Basophils Absolute 0.0 0.0 - 0.1 K/uL    Comment: Performed at [facility], [place] [address], [place], [place] [address]  Lipase, blood     Status: None   Collection Time: 10/27/17  2:24 PM  Result Value Ref Range   Lipase 31 11 - 51 U/L    Comment: Performed at [facility], [place] [address], [place], [place] [address]  Urinalysis, Routine w reflex microscopic     Status: Abnormal   Collection Time: 10/27/17  2:24 PM  Result Value Ref Range   Color, Urine YELLOW YELLOW   APPearance CLEAR CLEAR   Specific Gravity, Urine 1.029 1.005 - 1.030   pH 5.0 5.0 - 8.0   Glucose, UA 50 (A) NEGATIVE mg/dL   Hgb urine dipstick NEGATIVE NEGATIVE   Bilirubin Urine NEGATIVE NEGATIVE   Ketones, ur 5 (A) NEGATIVE mg/dL   Protein, ur NEGATIVE NEGATIVE mg/dL   Nitrite NEGATIVE NEGATIVE   Leukocytes, UA NEGATIVE NEGATIVE    Comment: Performed at [place] [address], [place], [place] [address]   Ct Angio Chest Pe W/cm &/or Wo Cm  Result Date: 10/27/2017 CLINICAL DATA:  Chest pain following fall several days ago, initial encounter EXAM: CT ANGIOGRAPHY CHEST CT ABDOMEN AND PELVIS WITH CONTRAST TECHNIQUE: Multidetector CT imaging of the chest was performed using the standard protocol during bolus administration of intravenous contrast. Multiplanar CT image reconstructions and MIPs were obtained to evaluate the vascular anatomy. Multidetector CT imaging of the abdomen and pelvis was performed using the standard protocol during bolus administration of intravenous contrast. CONTRAST:  128m ISOVUE-370 IOPAMIDOL (ISOVUE-370) INJECTION 76% COMPARISON:  03/22/2016 CT of the abdomen FINDINGS: CTA CHEST FINDINGS Cardiovascular: Mild dilatation of the ascending aorta 4.1 cm is noted. Normal tapering in the aortic arch is seen. Atherosclerotic calcifications are noted.  No findings to suggest dissection are seen. No cardiac enlargement is noted. Diffuse coronary calcifications are seen. The pulmonary artery demonstrates a normal branching pattern without intraluminal filling defect to suggest pulmonary embolism. Mediastinum/Nodes: Thoracic  inlet is within normal limits. No sizable hilar or mediastinal adenopathy is noted. The esophagus as visualized is within normal limits. Lungs/Pleura: Lungs are well aerated bilaterally. No focal infiltrate or sizable effusion is seen. No sizable parenchymal nodules are noted. Musculoskeletal: Degenerative changes of the thoracic spine are seen. No acute rib fracture or compression deformity is seen. There is a fracture through the spinous process of T8 which is better visualized on the CT of the thoracic spine. Review of the MIP images confirms the above findings. CT ABDOMEN and PELVIS FINDINGS Hepatobiliary: Diffuse fatty infiltration of the liver is noted. The gallbladder is well distended and demonstrates a single focus of non dependent air. This is likely related to prior instrumentation and sphincterotomy and has decreased significantly in the interval from the prior CT examination. Pancreas: Unremarkable. No pancreatic ductal dilatation or surrounding inflammatory changes. Spleen: Normal in size without focal abnormality. Adrenals/Urinary Tract: Adrenal glands are within normal limits. Kidneys demonstrate a normal enhancement pattern bilaterally. Small renal cyst is noted in the lower pole of the right kidney. Bladder is partially distended. Stomach/Bowel: There are changes consistent with left mid abdominal ostomy stable from the prior exam. No obstructive or inflammatory changes of the bowel are noted. The appendix is within normal limits. Vascular/Lymphatic: Aortic atherosclerosis. No enlarged abdominal or pelvic lymph nodes. Reproductive: Prostate is unremarkable. Other: Post treatment changes are noted anterior to the sacrum and  stable in appearance from the prior exam. No ascites is noted. No free air is seen. Musculoskeletal: Degenerative changes of the lumbar spine are noted. Review of the MIP images confirms the above findings. IMPRESSION: CTA of the chest: No evidence of pulmonary emboli. T8 spinous process fracture better visualized on CT of the thoracic spine. Dilatation of the ascending aorta to 4.1 cm. Recommend annual imaging followup by CTA or MRA. This recommendation follows 2010 ACCF/AHA/AATS/ACR/ASA/SCA/SCAI/SIR/STS/SVM Guidelines for the Diagnosis and Management of Patients with Thoracic Aortic Disease. Circulation. 2010; 121: R427-C623 CT of the abdomen and pelvis: Chronic changes similar to that seen on prior exam. No acute abnormality is noted. Electronically Signed   By: Inez Catalina M.D.   On: 10/27/2017 17:27   Ct Thoracic Spine Wo Contrast  Result Date: 10/27/2017 CLINICAL DATA:  Dementia patient status post fall with persistent back pain EXAM: CT THORACIC AND LUMBAR SPINE WITHOUT CONTRAST TECHNIQUE: Multidetector CT imaging of the thoracic and lumbar spine was performed without contrast. Multiplanar CT image reconstructions were also generated. COMPARISON:  Radiography 10/23/2017 FINDINGS: CT THORACIC SPINE FINDINGS Alignment: Normal Vertebrae: Solid bridging osteophytes throughout the entire thoracic region extending to L1. Nondisplaced fracture of the spinous process of T8. Fracture extends horizontally through the T7-8 disc level. No displacement. No canal compromise. Paraspinal and other soft tissues: Negative Disc levels: No stenosis of the canal or foramina. Small dural calcification posteriorly at T10. Not likely significant. This was present in 2017. CT LUMBAR SPINE FINDINGS Segmentation: 5 lumbar type vertebral bodies. Alignment: Normal except for 2 mm anterolisthesis L4-5. Vertebrae: No lumbosacral fracture in the region studied. Paraspinal and other soft tissues: Negative Disc levels: Moderate  multifactorial stenosis at L2-3, L3-4. At L4-5, there is severe multifactorial stenosis. At L5-S1 there is stenosis of the lateral recesses and foramina. Ankylosis of the sacroiliac joints. IMPRESSION: CT THORACIC SPINE IMPRESSION Fracture of the spinous process of T8, nondisplaced. Fracture extends transversely through the T7-8 disc level, Chance fracture equivalent. No displacement. No canal compromise. No unstable fracture seen. 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 CT  LUMBAR SPINE IMPRESSION No lumbar spine fracture. 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 Bilateral lateral recess and foraminal stenosis at L5-S1. Mild to moderate canal stenosis at L2-3 and L3-4. Electronically Signed   By: Nelson Chimes M.D.   On: 10/27/2017 17:17   Ct Lumbar Spine Wo Contrast  Result Date: 10/27/2017 CLINICAL DATA:  Dementia patient status post fall with persistent back pain EXAM: CT THORACIC AND LUMBAR SPINE WITHOUT CONTRAST TECHNIQUE: Multidetector CT imaging of the thoracic and lumbar spine was performed without contrast. Multiplanar CT image reconstructions were also generated. COMPARISON:  Radiography 10/23/2017 FINDINGS: CT THORACIC SPINE FINDINGS Alignment: Normal Vertebrae: Solid bridging osteophytes throughout the entire thoracic region extending to L1. Nondisplaced fracture of the spinous process of T8. Fracture extends horizontally through the T7-8 disc level. No displacement. No canal compromise. Paraspinal and other soft tissues: Negative Disc levels: No stenosis of the canal or foramina. Small dural calcification posteriorly at T10. Not likely significant. This was present in 2017. CT LUMBAR SPINE FINDINGS Segmentation: 5 lumbar type vertebral bodies. Alignment: Normal except for 2 mm anterolisthesis L4-5. Vertebrae: No lumbosacral fracture in the region studied. Paraspinal and other soft tissues: Negative Disc levels: Moderate multifactorial stenosis at L2-3, L3-4. At L4-5, there is severe multifactorial  stenosis. At L5-S1 there is stenosis of the lateral recesses and foramina. Ankylosis of the sacroiliac joints. IMPRESSION: CT THORACIC SPINE IMPRESSION Fracture of the spinous process of T8, nondisplaced. Fracture extends transversely through the T7-8 disc level, Chance fracture equivalent. No displacement. No canal compromise. No unstable fracture seen. 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 CT LUMBAR SPINE IMPRESSION No lumbar spine fracture. 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 Bilateral lateral recess and foraminal stenosis at L5-S1. Mild to moderate canal stenosis at L2-3 and L3-4. Electronically Signed   By: Nelson Chimes M.D.   On: 10/27/2017 17:17   Mr Thoracic Spine Wo Contrast  Result Date: 10/27/2017 CLINICAL DATA:  Thoracic spine fracture, continued surveillance EXAM: MRI THORACIC SPINE WITHOUT CONTRAST TECHNIQUE: Multiplanar, multisequence MR imaging of the thoracic spine was performed. No intravenous contrast was administered. COMPARISON:  CT of the thoracic spine 10/27/2017. FINDINGS: Alignment:  Anatomic Vertebrae: T8 spinous process fracture is nondisplaced, barely visible on MR. Minor endplate fractures of T7 inferiorly and T8 superiorly reflect the osseous injury observed on CT. There is no significant wedging. Posterior elements are otherwise intact. No other occult fractures are evident. Cord: No cord injury. 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 This is an incidental finding. Paraspinal and other soft tissues: Prevertebral hematoma can be seen anterior to the T7 and T8 vertebral bodies. There is no intraspinal hematoma. No visible disruption of the posterior longitudinal ligament, or interspinous ligaments. The anterior longitudinal ligament is calcified/ossified, and discontinuous Disc levels: No disc protrusion or spinal stenosis. Functional fusion across the T8-9 and T9-10 levels. IMPRESSION: Chance like thoracic spine  fracture involving T8 spinous process, extending anteriorly through the T7-8 disc space, and only minor edema of the inferior T7 and superior T8 endplates reflecting the small fractures immediately adjacent to the interspace. No significant wedging of the vertebral bodies, or visible injury elsewhere in the thoracic spine. No features to suggest disruption of the posterior longitudinal ligament or posterior interspinous/facet related ligaments at the T7-8 level. There is no vertebral displacement/subluxation. Prevertebral hematoma in the posterior mediastinum opposite the T7-8 disc space. 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 Electronically Signed  By: Staci Righter M.D.   On: 10/27/2017 21:50   Ct Abdomen Pelvis W Contrast  Result Date: 10/27/2017 CLINICAL DATA:  Chest pain following fall several days ago, initial encounter EXAM: CT ANGIOGRAPHY CHEST CT ABDOMEN AND PELVIS WITH CONTRAST TECHNIQUE: Multidetector CT imaging of the chest was performed using the standard protocol during bolus administration of intravenous contrast. Multiplanar CT image reconstructions and MIPs were obtained to evaluate the vascular anatomy. Multidetector CT imaging of the abdomen and pelvis was performed using the standard protocol during bolus administration of intravenous contrast. CONTRAST:  128m ISOVUE-370 IOPAMIDOL (ISOVUE-370) INJECTION 76% COMPARISON:  03/22/2016 CT of the abdomen FINDINGS: CTA CHEST FINDINGS Cardiovascular: Mild dilatation of the ascending aorta 4.1 cm is noted. Normal tapering in the aortic arch is seen. Atherosclerotic calcifications are noted. No findings to suggest dissection are seen. No cardiac enlargement is noted. Diffuse coronary calcifications are seen. The pulmonary artery demonstrates a normal branching pattern without intraluminal filling defect to suggest pulmonary embolism. Mediastinum/Nodes: Thoracic inlet is within normal limits. No sizable  hilar or mediastinal adenopathy is noted. The esophagus as visualized is within normal limits. Lungs/Pleura: Lungs are well aerated bilaterally. No focal infiltrate or sizable effusion is seen. No sizable parenchymal nodules are noted. Musculoskeletal: Degenerative changes of the thoracic spine are seen. No acute rib fracture or compression deformity is seen. There is a fracture through the spinous process of T8 which is better visualized on the CT of the thoracic spine. Review of the MIP images confirms the above findings. CT ABDOMEN and PELVIS FINDINGS Hepatobiliary: Diffuse fatty infiltration of the liver is noted. The gallbladder is well distended and demonstrates a single focus of non dependent air. This is likely related to prior instrumentation and sphincterotomy and has decreased significantly in the interval from the prior CT examination. Pancreas: Unremarkable. No pancreatic ductal dilatation or surrounding inflammatory changes. Spleen: Normal in size without focal abnormality. Adrenals/Urinary Tract: Adrenal glands are within normal limits. Kidneys demonstrate a normal enhancement pattern bilaterally. Small renal cyst is noted in the lower pole of the right kidney. Bladder is partially distended. Stomach/Bowel: There are changes consistent with left mid abdominal ostomy stable from the prior exam. No obstructive or inflammatory changes of the bowel are noted. The appendix is within normal limits. Vascular/Lymphatic: Aortic atherosclerosis. No enlarged abdominal or pelvic lymph nodes. Reproductive: Prostate is unremarkable. Other: Post treatment changes are noted anterior to the sacrum and stable in appearance from the prior exam. No ascites is noted. No free air is seen. Musculoskeletal: Degenerative changes of the lumbar spine are noted. Review of the MIP images confirms the above findings. IMPRESSION: CTA of the chest: No evidence of pulmonary emboli. T8 spinous process fracture better visualized on CT  of the thoracic spine. Dilatation of the ascending aorta to 4.1 cm. Recommend annual imaging followup by CTA or MRA. This recommendation follows 2010 ACCF/AHA/AATS/ACR/ASA/SCA/SCAI/SIR/STS/SVM Guidelines for the Diagnosis and Management of Patients with Thoracic Aortic Disease. Circulation. 2010; 121: [id number] of the abdomen and pelvis: Chronic changes similar to that seen on prior exam. No acute abnormality is noted. Electronically Signed   By: MInez CatalinaM.D.   On: 10/27/2017 17:27    Pending Labs [employer](From admission, onward)    Start     Ordered   Signed and Held  Hemoglobin A1c  Tomorrow morning,   R    Comments:  To assess prior glycemic control    Signed and Held   Signed and Held  Magnesium  Tomorrow morning,   R    Comments:  Call MD if <1.5    Signed and Held   Signed and Held  Phosphorus  Tomorrow morning,   R     Signed and Held   Signed and Held  TSH  Once,   R    Comments:  Cancel if already done within 1 month and notify MD    Signed and Held   Signed and Held  Comprehensive metabolic panel  Once,   R    Comments:  Cal MD for K<3.5 or >5.0    Signed and Held   Signed and Held  CBC  Once,   R    Comments:  Call for hg <8.0    Signed and Held          Vitals/Pain Today's Vitals   10/27/17 2200 10/27/17 2230 10/27/17 2300 10/27/17 2303  BP: (!) 153/100 (!) 161/90 (!) 163/97   Pulse: (!) 101 100 (!) 102   Resp: (!) 24 (!) 21 (!) 22   Temp:      TempSrc:      SpO2: 96% 95% 94%   Weight:      PainSc:    10-Worst pain ever    Isolation Precautions No active isolations  Medications Medications  iopamidol (ISOVUE-370) 76 % injection (has no administration in time range)  methocarbamol (ROBAXIN) tablet 500 mg (500 mg Oral Given 10/27/17 2302)  morphine 2 MG/ML injection 1 mg (1 mg Intravenous Given 10/27/17 2303)  iopamidol (ISOVUE-370) 76 % injection 100 mL (100 mLs Intravenous Contrast Given 10/27/17 1623)    Mobility non-ambulatory

## 2017-10-28 ENCOUNTER — Encounter (HOSPITAL_COMMUNITY): Payer: Self-pay

## 2017-10-28 ENCOUNTER — Observation Stay (HOSPITAL_COMMUNITY): Payer: PPO

## 2017-10-28 ENCOUNTER — Other Ambulatory Visit: Payer: Self-pay

## 2017-10-28 DIAGNOSIS — S22009A Unspecified fracture of unspecified thoracic vertebra, initial encounter for closed fracture: Secondary | ICD-10-CM | POA: Diagnosis not present

## 2017-10-28 LAB — TSH: TSH: 17.551 u[IU]/mL — ABNORMAL HIGH (ref 0.350–4.500)

## 2017-10-28 LAB — GLUCOSE, CAPILLARY
GLUCOSE-CAPILLARY: 184 mg/dL — AB (ref 70–99)
GLUCOSE-CAPILLARY: 184 mg/dL — AB (ref 70–99)
GLUCOSE-CAPILLARY: 191 mg/dL — AB (ref 70–99)
GLUCOSE-CAPILLARY: 199 mg/dL — AB (ref 70–99)
Glucose-Capillary: 115 mg/dL — ABNORMAL HIGH (ref 70–99)
Glucose-Capillary: 157 mg/dL — ABNORMAL HIGH (ref 70–99)
Glucose-Capillary: 171 mg/dL — ABNORMAL HIGH (ref 70–99)

## 2017-10-28 LAB — CBC
HCT: 42.3 % (ref 39.0–52.0)
Hemoglobin: 13.6 g/dL (ref 13.0–17.0)
MCH: 29.8 pg (ref 26.0–34.0)
MCHC: 32.2 g/dL (ref 30.0–36.0)
MCV: 92.6 fL (ref 78.0–100.0)
Platelets: 177 10*3/uL (ref 150–400)
RBC: 4.57 MIL/uL (ref 4.22–5.81)
RDW: 13.9 % (ref 11.5–15.5)
WBC: 6.2 10*3/uL (ref 4.0–10.5)

## 2017-10-28 LAB — PHOSPHORUS: Phosphorus: 3.9 mg/dL (ref 2.5–4.6)

## 2017-10-28 LAB — COMPREHENSIVE METABOLIC PANEL
ALK PHOS: 116 U/L (ref 38–126)
ALT: 20 U/L (ref 0–44)
AST: 24 U/L (ref 15–41)
Albumin: 3.4 g/dL — ABNORMAL LOW (ref 3.5–5.0)
Anion gap: 8 (ref 5–15)
BUN: 28 mg/dL — ABNORMAL HIGH (ref 8–23)
CALCIUM: 9.4 mg/dL (ref 8.9–10.3)
CO2: 33 mmol/L — AB (ref 22–32)
Chloride: 100 mmol/L (ref 98–111)
Creatinine, Ser: 1.04 mg/dL (ref 0.61–1.24)
GFR calc non Af Amer: >60 mL/min (ref >60)
GLUCOSE: 171 mg/dL — AB (ref 70–99)
Potassium: 5.1 mmol/L (ref 3.5–5.1)
SODIUM: 141 mmol/L (ref 135–145)
Total Bilirubin: 1 mg/dL (ref 0.3–1.2)
Total Protein: 7 g/dL (ref 6.5–8.1)

## 2017-10-28 LAB — MAGNESIUM: Magnesium: 2 mg/dL (ref 1.7–2.4)

## 2017-10-28 LAB — T4, FREE: FREE T4: 0.91 ng/dL (ref 0.82–1.77)

## 2017-10-28 MED ORDER — ACETAMINOPHEN 325 MG PO TABS
650.0000 mg | ORAL_TABLET | Freq: Four times a day (QID) | ORAL | Status: DC | PRN
  Administered 2017-10-28: 650 mg via ORAL
  Filled 2017-10-28: qty 2

## 2017-10-28 MED ORDER — POLYETHYLENE GLYCOL 3350 17 G PO PACK
17.0000 g | PACK | Freq: Two times a day (BID) | ORAL | Status: DC
  Administered 2017-10-28 – 2017-10-30 (×5): 17 g via ORAL
  Filled 2017-10-28 (×4): qty 1

## 2017-10-28 MED ORDER — MEMANTINE HCL 10 MG PO TABS
10.0000 mg | ORAL_TABLET | Freq: Two times a day (BID) | ORAL | Status: DC
  Administered 2017-10-28 – 2017-10-31 (×8): 10 mg via ORAL
  Filled 2017-10-28 (×8): qty 1

## 2017-10-28 MED ORDER — PANTOPRAZOLE SODIUM 40 MG PO TBEC
40.0000 mg | DELAYED_RELEASE_TABLET | Freq: Every day | ORAL | Status: DC
  Administered 2017-10-28 – 2017-10-31 (×4): 40 mg via ORAL
  Filled 2017-10-28 (×4): qty 1

## 2017-10-28 MED ORDER — INSULIN ASPART 100 UNIT/ML ~~LOC~~ SOLN
0.0000 [IU] | SUBCUTANEOUS | Status: DC
  Administered 2017-10-28 (×4): 2 [IU] via SUBCUTANEOUS
  Administered 2017-10-29: 5 [IU] via SUBCUTANEOUS
  Administered 2017-10-29: 1 [IU] via SUBCUTANEOUS
  Administered 2017-10-29: 2 [IU] via SUBCUTANEOUS

## 2017-10-28 MED ORDER — METOCLOPRAMIDE HCL 5 MG/ML IJ SOLN: 5.0000 mg | Freq: Four times a day (QID) | INTRAMUSCULAR | Status: DC | PRN

## 2017-10-28 MED ORDER — GABAPENTIN 300 MG PO CAPS
300.0000 mg | ORAL_CAPSULE | Freq: Three times a day (TID) | ORAL | Status: DC
  Administered 2017-10-28 – 2017-10-31 (×10): 300 mg via ORAL
  Filled 2017-10-28 (×10): qty 1

## 2017-10-28 MED ORDER — INSULIN ASPART 100 UNIT/ML ~~LOC~~ SOLN: 0.0000 [IU] | Freq: Every day | SUBCUTANEOUS | Status: DC

## 2017-10-28 MED ORDER — ATORVASTATIN CALCIUM 20 MG PO TABS
20.0000 mg | ORAL_TABLET | Freq: Every day | ORAL | Status: DC
  Administered 2017-10-28 – 2017-10-31 (×4): 20 mg via ORAL
  Filled 2017-10-28 (×4): qty 1

## 2017-10-28 MED ORDER — AMLODIPINE BESYLATE 5 MG PO TABS
5.0000 mg | ORAL_TABLET | Freq: Every day | ORAL | Status: DC
  Administered 2017-10-28 – 2017-10-31 (×4): 5 mg via ORAL
  Filled 2017-10-28 (×4): qty 1

## 2017-10-28 MED ORDER — CLONAZEPAM 0.5 MG PO TABS
0.5000 mg | ORAL_TABLET | Freq: Two times a day (BID) | ORAL | Status: DC
  Administered 2017-10-28 – 2017-10-31 (×8): 0.5 mg via ORAL
  Filled 2017-10-28 (×8): qty 1

## 2017-10-28 MED ORDER — HYDROCODONE-ACETAMINOPHEN 5-325 MG PO TABS
1.0000 | ORAL_TABLET | ORAL | Status: DC | PRN
  Administered 2017-10-28 (×2): 2 via ORAL
  Administered 2017-10-29 – 2017-10-31 (×6): 1 via ORAL
  Filled 2017-10-28 (×2): qty 1
  Filled 2017-10-28 (×2): qty 2
  Filled 2017-10-28 (×4): qty 1

## 2017-10-28 MED ORDER — ONDANSETRON HCL 4 MG PO TABS: 4.0000 mg | ORAL_TABLET | Freq: Four times a day (QID) | ORAL | Status: DC | PRN

## 2017-10-28 MED ORDER — POLYETHYLENE GLYCOL 3350 17 G PO PACK
17.0000 g | PACK | Freq: Every day | ORAL | Status: DC | PRN
  Administered 2017-10-28: 17 g via ORAL
  Filled 2017-10-28: qty 1

## 2017-10-28 MED ORDER — DIVALPROEX SODIUM 250 MG PO DR TAB
500.0000 mg | DELAYED_RELEASE_TABLET | Freq: Three times a day (TID) | ORAL | Status: DC
  Administered 2017-10-28 – 2017-10-31 (×10): 500 mg via ORAL
  Filled 2017-10-28 (×10): qty 2

## 2017-10-28 MED ORDER — ACETAMINOPHEN 650 MG RE SUPP: 650.0000 mg | Freq: Four times a day (QID) | RECTAL | Status: DC | PRN

## 2017-10-28 MED ORDER — BUPROPION HCL ER (XL) 300 MG PO TB24
300.0000 mg | ORAL_TABLET | Freq: Every day | ORAL | Status: DC
  Administered 2017-10-28 – 2017-10-31 (×4): 300 mg via ORAL
  Filled 2017-10-28 (×4): qty 1

## 2017-10-28 MED ORDER — ONDANSETRON HCL 4 MG/2ML IJ SOLN: 4.0000 mg | Freq: Four times a day (QID) | INTRAMUSCULAR | Status: DC | PRN

## 2017-10-28 MED ORDER — INSULIN ASPART 100 UNIT/ML ~~LOC~~ SOLN: 0.0000 [IU] | Freq: Three times a day (TID) | SUBCUTANEOUS | Status: DC

## 2017-10-28 NOTE — Plan of Care (Signed)
Pt alert, dementia at baseline.  Some confusion at times.  Wife at the bedside.  Fitted for brace this am.  RN will monitor.

## 2017-10-28 NOTE — Progress Notes (Signed)
Patient ID: Ricky Conley, male   DOB: May 22, 1945, 72 y.o.   MRN: [id number]   Patient doing well condition of back pain but no leg pain  Strength 5 out of 5 lower extremities brace is on and looks like it fits well  Check upright film and brace if alignment intact patient can be mobilized with physical therapy

## 2017-10-28 NOTE — Progress Notes (Signed)
PROGRESS NOTE    Ricky Conley  IEP:[id number] DOB: 1945/11/18 DOA: 10/27/2017 PCP: Shirline Frees, MD  Brief Narrative:  HPI per Dr. Toy Baker on 10/27/17 Ricky Conley is a 72 y.o. male with medical history significant of Lewy body dementia as well as colorectal cancer with colostomy bag placed, lumbar spinal stenosis, Ascending aortic aneurysm, DM 2, HTN, HLD    Presented with multiple falls up to 16 times over past month.  Is been having repeated falls this time states he has fallen earlier today did not hit his head no LOC he has been having significant low back pain radiating to his thoracic area and shoulder blades.  Is been getting progressively worse no associated shortness of breath or cough Constipation no BM for the past 4 days. No nausea or vomiting  Vomiting no chest pain he has not tried to use anything for constipation  **Seen and examined by neurosurgery had an MRI done.  Back brace has been ordered and surgery checking upright films and brace if alignment is intact patient can be mobilized with physical therapy  Assessment & Plan:   Active Problems:   Ascending aortic aneurysm (HCC)   Hypernatremia   Bipolar disorder (HCC)   Colostomy in place [facility])   Hyperlipidemia   Dementia with behavioral disturbance   Frequent falls   T8 vertebral fracture (HCC)   Constipation  T8 vertebral fracture (HCC)  -will need special brace in a.m. due to history of colostomy.  Bedrest for now and.   -Appreciate neurosurgical consult.   -MRI done and showed "Chance like thoracic spine fracture involving T8 spinous process, extending anteriorly through the T7-8 disc space, and only minor edema of the inferior T7 and superior T8 endplates reflecting the small fractures immediately adjacent to the interspace. No significant wedging of the vertebral bodies, or visible injury elsewhere in the thoracic spine. No features to suggest disruption of the posterior longitudinal ligament or  posterior interspinous/facet related ligaments at the T7-8 level. There is no vertebral displacement/subluxation. Prevertebral hematoma in the posterior mediastinum opposite the T7-8 disc space. 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, with mild cord flattening." -Continue pain management with hydrocodone-acetaminophen 1 tab to 2 tablets every 4 hours PRN for moderate pain and IV morphine 1 mg every 4 PRN -Defer to neurosurgery when they feel that PT OT is safe to perform and they are checking upright film with the brace prior to immobilizing -Continue to follow recommendations per neurosurgery and continue monitor patient closely  Bipolar disorder ([place])  -Continue home medications currently stable and continue home bupropion, clonazepam, Depakote  Ascending aortic aneurysm ([place])  -Will need follow-up as an outpatient  Dementia with behavioral disturbance  -watch for sundowning and continue home medications with memantine mean 10 mill grams p.o. twice daily, bupropion 300 g p.o. daily, clonazepam 0.5 g p.o. twice daily, Depakote 500 mg p.o. 3 times daily   Hyperlipidemia  -stable continue home medications with atorvastatin 20 g p.o. nightly    Constipation decreased bowel movements and abdominal distention patient status post colostomy.  Suspect pain induced constipation will order bowel regimen with MiraLAX twice daily and continue  DM 2 -  -Order Sensitive  SSI  -check TSH and HgA1C -Hold by mouth medications   Hypertension -Continue home medications with amlodipine  GERD -Continue with pantoprazole 40 g p.o. Daily  Abnormal thyroid function studies -TSH was 17.551 -Free T4 normal at 0.91 -Repeat studies and 4 to 6 weeks and if  necessary start a medication follow-up with endocrinology  DVT prophylaxis: SCDs Code Status: Full code Family Communication: No family present at bedside Disposition Plan: Pending PT evaluation   Consultants:    Neurosurgery   Procedures: None  Antimicrobials:  Anti-infectives (From admission, onward)   None     Subjective: Seen and examined at bedside states that he has not had a bowel movement for a while.  No chest pain, shortness breath or nausea or vomiting.  She is back is hurting between his shoulder blades.  No other concerns or complaints at this time  Objective: Vitals:   10/28/17 0009 10/28/17 0512 10/28/17 1100 10/28/17 1447  BP: (!) 159/94 (!) 159/95  (!) 145/80  Pulse: (!) 104 87  88  Resp: 16     Temp: 97.9 F (36.6 C) 98.1 F (36.7 C)  98.3 F (36.8 C)  TempSrc: Oral Oral  Oral  SpO2: 97% 95%  96%  Weight:   81.6 kg   Height:   5\' 10"  (1.778 m)     Intake/Output Summary (Last 24 hours) at 10/28/2017 1822 Last data filed at 10/28/2017 1054 Gross per 24 hour  Intake 240 ml  Output 400 ml  Net -160 ml   Filed Weights   10/27/17 1224 10/28/17 1100  Weight: 82.1 kg 81.6 kg   Examination: Physical Exam:  Constitutional: WN/WD overweight Caucasian male in NAD and appears calm but slightly uncomfortable Eyes: Lids and conjunctivae normal, sclerae anicteric  ENMT: External Ears, Nose appear normal. Grossly normal hearing. Mucous membranes are moist.  Neck: Appears normal, supple, no cervical masses, normal ROM, no appreciable thyromegaly, no JVD Respiratory: Diminished to auscultation bilaterally, no wheezing, rales, rhonchi or crackles. Normal respiratory effort and patient is not tachypenic. Cardiovascular: RRR, no murmurs / rubs / gallops. S1 and S2 auscultated. No carotid bruits.  Abdomen: Soft, non-tender, non-distended. No masses palpated. No appreciable hepatosplenomegaly. Bowel sounds positive x4. Has a colostomy in place GU: Deferred. Musculoskeletal: No clubbing / cyanosis of digits/nails. No joint deformities. Skin: No rashes, lesions, ulcers on a limited skin evaluation. No induration; Warm and dry.  Neurologic: CN 2-12 grossly intact with no focal  deficits.  Romberg sign and cerebellar reflexes not assessed.  Psychiatric: Slightly impaired judgment and insight. Alert and oriented x 3. Normal mood and appropriate affect.   Data Reviewed: I have personally reviewed following labs and imaging studies  CBC: Recent Labs  Lab 10/27/17 1424 10/28/17 0507  WBC 7.1 6.2  NEUTROABS 4.1  --   HGB 14.1 13.6  HCT 43.6 42.3  MCV 92.8 92.6  PLT 161 093   Basic Metabolic Panel: Recent Labs  Lab 10/27/17 1424 10/28/17 0507  NA 141 141  K 4.7 5.1  CL 101 100  CO2 30 33*  GLUCOSE 150* 171*  BUN 24* 28*  CREATININE 0.95 1.04  CALCIUM 9.3 9.4  MG  --  2.0  PHOS  --  3.9   GFR: Estimated Creatinine Clearance: 64.3 mL/min (by C-G formula based on SCr of 1.04 mg/dL). Liver Function Tests: Recent Labs  Lab 10/27/17 1424 10/28/17 0507  AST 21 24  ALT 20 20  ALKPHOS 125 116  BILITOT 1.2 1.0  PROT 7.2 7.0  ALBUMIN 3.5 3.4*   Recent Labs  Lab 10/27/17 1424  LIPASE 31   No results for input(s): AMMONIA in the last 168 hours. Coagulation Profile: No results for input(s): INR, PROTIME in the last 168 hours. Cardiac Enzymes: No results for input(s): CKTOTAL, CKMB,  CKMBINDEX, TROPONINI in the last 168 hours. BNP (last 3 results) No results for input(s): PROBNP in the last 8760 hours. HbA1C: No results for input(s): HGBA1C in the last 72 hours. CBG: Recent Labs  Lab 10/28/17 0042 10/28/17 0427 10/28/17 0743 10/28/17 1254  GLUCAP 184* 171* 115* 199*   Lipid Profile: No results for input(s): CHOL, HDL, LDLCALC, TRIG, CHOLHDL, LDLDIRECT in the last 72 hours. Thyroid Function Tests: Recent Labs    10/28/17 0507  TSH 17.551*  FREET4 0.91   Anemia Panel: No results for input(s): VITAMINB12, FOLATE, FERRITIN, TIBC, IRON, RETICCTPCT in the last 72 hours. Sepsis Labs: No results for input(s): PROCALCITON, LATICACIDVEN in the last 168 hours.  No results found for this or any previous visit (from the past 240 hour(s)).    Radiology Studies: Ct Angio Chest Pe W/cm &/or Wo Cm  Result Date: 10/27/2017 CLINICAL DATA:  Chest pain following fall several days ago, initial encounter EXAM: CT ANGIOGRAPHY CHEST CT ABDOMEN AND PELVIS WITH CONTRAST TECHNIQUE: Multidetector CT imaging of the chest was performed using the standard protocol during bolus administration of intravenous contrast. Multiplanar CT image reconstructions and MIPs were obtained to evaluate the vascular anatomy. Multidetector CT imaging of the abdomen and pelvis was performed using the standard protocol during bolus administration of intravenous contrast. CONTRAST:  136mL ISOVUE-370 IOPAMIDOL (ISOVUE-370) INJECTION 76% COMPARISON:  03/22/2016 CT of the abdomen FINDINGS: CTA CHEST FINDINGS Cardiovascular: Mild dilatation of the ascending aorta 4.1 cm is noted. Normal tapering in the aortic arch is seen. Atherosclerotic calcifications are noted. No findings to suggest dissection are seen. No cardiac enlargement is noted. Diffuse coronary calcifications are seen. The pulmonary artery demonstrates a normal branching pattern without intraluminal filling defect to suggest pulmonary embolism. Mediastinum/Nodes: Thoracic inlet is within normal limits. No sizable hilar or mediastinal adenopathy is noted. The esophagus as visualized is within normal limits. Lungs/Pleura: Lungs are well aerated bilaterally. No focal infiltrate or sizable effusion is seen. No sizable parenchymal nodules are noted. Musculoskeletal: Degenerative changes of the thoracic spine are seen. No acute rib fracture or compression deformity is seen. There is a fracture through the spinous process of T8 which is better visualized on the CT of the thoracic spine. Review of the MIP images confirms the above findings. CT ABDOMEN and PELVIS FINDINGS Hepatobiliary: Diffuse fatty infiltration of the liver is noted. The gallbladder is well distended and demonstrates a single focus of non dependent air. This is likely  related to prior instrumentation and sphincterotomy and has decreased significantly in the interval from the prior CT examination. Pancreas: Unremarkable. No pancreatic ductal dilatation or surrounding inflammatory changes. Spleen: Normal in size without focal abnormality. Adrenals/Urinary Tract: Adrenal glands are within normal limits. Kidneys demonstrate a normal enhancement pattern bilaterally. Small renal cyst is noted in the lower pole of the right kidney. Bladder is partially distended. Stomach/Bowel: There are changes consistent with left mid abdominal ostomy stable from the prior exam. No obstructive or inflammatory changes of the bowel are noted. The appendix is within normal limits. Vascular/Lymphatic: Aortic atherosclerosis. No enlarged abdominal or pelvic lymph nodes. Reproductive: Prostate is unremarkable. Other: Post treatment changes are noted anterior to the sacrum and stable in appearance from the prior exam. No ascites is noted. No free air is seen. Musculoskeletal: Degenerative changes of the lumbar spine are noted. Review of the MIP images confirms the above findings. IMPRESSION: CTA of the chest: No evidence of pulmonary emboli. T8 spinous process fracture better visualized on CT of the thoracic  spine. Dilatation of the ascending aorta to 4.1 cm. Recommend annual imaging followup by CTA or MRA. This recommendation follows 2010 ACCF/AHA/AATS/ACR/ASA/SCA/SCAI/SIR/STS/SVM Guidelines for the Diagnosis and Management of Patients with Thoracic Aortic Disease. Circulation. 2010; 121: X381-W299 CT of the abdomen and pelvis: Chronic changes similar to that seen on prior exam. No acute abnormality is noted. Electronically Signed   By: Inez Catalina M.D.   On: 10/27/2017 17:27   Ct Thoracic Spine Wo Contrast  Result Date: 10/27/2017 CLINICAL DATA:  Dementia patient status post fall with persistent back pain EXAM: CT THORACIC AND LUMBAR SPINE WITHOUT CONTRAST TECHNIQUE: Multidetector CT imaging of the  thoracic and lumbar spine was performed without contrast. Multiplanar CT image reconstructions were also generated. COMPARISON:  Radiography 10/23/2017 FINDINGS: CT THORACIC SPINE FINDINGS Alignment: Normal Vertebrae: Solid bridging osteophytes throughout the entire thoracic region extending to L1. Nondisplaced fracture of the spinous process of T8. Fracture extends horizontally through the T7-8 disc level. No displacement. No canal compromise. Paraspinal and other soft tissues: Negative Disc levels: No stenosis of the canal or foramina. Small dural calcification posteriorly at T10. Not likely significant. This was present in 2017. CT LUMBAR SPINE FINDINGS Segmentation: 5 lumbar type vertebral bodies. Alignment: Normal except for 2 mm anterolisthesis L4-5. Vertebrae: No lumbosacral fracture in the region studied. Paraspinal and other soft tissues: Negative Disc levels: Moderate multifactorial stenosis at L2-3, L3-4. At L4-5, there is severe multifactorial stenosis. At L5-S1 there is stenosis of the lateral recesses and foramina. Ankylosis of the sacroiliac joints. IMPRESSION: CT THORACIC SPINE IMPRESSION Fracture of the spinous process of T8, nondisplaced. Fracture extends transversely through the T7-8 disc level, Chance fracture equivalent. No displacement. No canal compromise. No unstable fracture seen. 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 CT LUMBAR SPINE IMPRESSION No lumbar spine fracture. 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 Bilateral lateral recess and foraminal stenosis at L5-S1. Mild to moderate canal stenosis at L2-3 and L3-4. Electronically Signed   By: Nelson Chimes M.D.   On: 10/27/2017 17:17   Ct Lumbar Spine Wo Contrast  Result Date: 10/27/2017 CLINICAL DATA:  Dementia patient status post fall with persistent back pain EXAM: CT THORACIC AND LUMBAR SPINE WITHOUT CONTRAST TECHNIQUE: Multidetector CT imaging of the thoracic and lumbar spine was performed without contrast. Multiplanar CT image  reconstructions were also generated. COMPARISON:  Radiography 10/23/2017 FINDINGS: CT THORACIC SPINE FINDINGS Alignment: Normal Vertebrae: Solid bridging osteophytes throughout the entire thoracic region extending to L1. Nondisplaced fracture of the spinous process of T8. Fracture extends horizontally through the T7-8 disc level. No displacement. No canal compromise. Paraspinal and other soft tissues: Negative Disc levels: No stenosis of the canal or foramina. Small dural calcification posteriorly at T10. Not likely significant. This was present in 2017. CT LUMBAR SPINE FINDINGS Segmentation: 5 lumbar type vertebral bodies. Alignment: Normal except for 2 mm anterolisthesis L4-5. Vertebrae: No lumbosacral fracture in the region studied. Paraspinal and other soft tissues: Negative Disc levels: Moderate multifactorial stenosis at L2-3, L3-4. At L4-5, there is severe multifactorial stenosis. At L5-S1 there is stenosis of the lateral recesses and foramina. Ankylosis of the sacroiliac joints. IMPRESSION: CT THORACIC SPINE IMPRESSION Fracture of the spinous process of T8, nondisplaced. Fracture extends transversely through the T7-8 disc level, Chance fracture equivalent. No displacement. No canal compromise. No unstable fracture seen. 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 CT LUMBAR SPINE IMPRESSION No lumbar spine fracture. 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 Bilateral lateral recess and foraminal stenosis at L5-S1. Mild to moderate canal stenosis  at L2-3 and L3-4. Electronically Signed   By: Nelson Chimes M.D.   On: 10/27/2017 17:17   Mr Thoracic Spine Wo Contrast  Result Date: 10/27/2017 CLINICAL DATA:  Thoracic spine fracture, continued surveillance EXAM: MRI THORACIC SPINE WITHOUT CONTRAST TECHNIQUE: Multiplanar, multisequence MR imaging of the thoracic spine was performed. No intravenous contrast was administered. COMPARISON:  CT of the thoracic spine 10/27/2017. FINDINGS: Alignment:  Anatomic Vertebrae: T8  spinous process fracture is nondisplaced, barely visible on MR. Minor endplate fractures of T7 inferiorly and T8 superiorly reflect the osseous injury observed on CT. There is no significant wedging. Posterior elements are otherwise intact. No other occult fractures are evident. Cord: No cord injury. 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 This is an incidental finding. Paraspinal and other soft tissues: Prevertebral hematoma can be seen anterior to the T7 and T8 vertebral bodies. There is no intraspinal hematoma. No visible disruption of the posterior longitudinal ligament, or interspinous ligaments. The anterior longitudinal ligament is calcified/ossified, and discontinuous Disc levels: No disc protrusion or spinal stenosis. Functional fusion across the T8-9 and T9-10 levels. IMPRESSION: Chance like thoracic spine fracture involving T8 spinous process, extending anteriorly through the T7-8 disc space, and only minor edema of the inferior T7 and superior T8 endplates reflecting the small fractures immediately adjacent to the interspace. No significant wedging of the vertebral bodies, or visible injury elsewhere in the thoracic spine. No features to suggest disruption of the posterior longitudinal ligament or posterior interspinous/facet related ligaments at the T7-8 level. There is no vertebral displacement/subluxation. Prevertebral hematoma in the posterior mediastinum opposite the T7-8 disc space. 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 Electronically Signed   By: Staci Righter M.D.   On: 10/27/2017 21:50   Ct Abdomen Pelvis W Contrast  Result Date: 10/27/2017 CLINICAL DATA:  Chest pain following fall several days ago, initial encounter EXAM: CT ANGIOGRAPHY CHEST CT ABDOMEN AND PELVIS WITH CONTRAST TECHNIQUE: Multidetector CT imaging of the chest was performed using the standard protocol during bolus  administration of intravenous contrast. Multiplanar CT image reconstructions and MIPs were obtained to evaluate the vascular anatomy. Multidetector CT imaging of the abdomen and pelvis was performed using the standard protocol during bolus administration of intravenous contrast. CONTRAST:  177mL ISOVUE-370 IOPAMIDOL (ISOVUE-370) INJECTION 76% COMPARISON:  03/22/2016 CT of the abdomen FINDINGS: CTA CHEST FINDINGS Cardiovascular: Mild dilatation of the ascending aorta 4.1 cm is noted. Normal tapering in the aortic arch is seen. Atherosclerotic calcifications are noted. No findings to suggest dissection are seen. No cardiac enlargement is noted. Diffuse coronary calcifications are seen. The pulmonary artery demonstrates a normal branching pattern without intraluminal filling defect to suggest pulmonary embolism. Mediastinum/Nodes: Thoracic inlet is within normal limits. No sizable hilar or mediastinal adenopathy is noted. The esophagus as visualized is within normal limits. Lungs/Pleura: Lungs are well aerated bilaterally. No focal infiltrate or sizable effusion is seen. No sizable parenchymal nodules are noted. Musculoskeletal: Degenerative changes of the thoracic spine are seen. No acute rib fracture or compression deformity is seen. There is a fracture through the spinous process of T8 which is better visualized on the CT of the thoracic spine. Review of the MIP images confirms the above findings. CT ABDOMEN and PELVIS FINDINGS Hepatobiliary: Diffuse fatty infiltration of the liver is noted. The gallbladder is well distended and demonstrates a single focus of non dependent air. This is likely related to prior instrumentation and  sphincterotomy and has decreased significantly in the interval from the prior CT examination. Pancreas: Unremarkable. No pancreatic ductal dilatation or surrounding inflammatory changes. Spleen: Normal in size without focal abnormality. Adrenals/Urinary Tract: Adrenal glands are within  normal limits. Kidneys demonstrate a normal enhancement pattern bilaterally. Small renal cyst is noted in the lower pole of the right kidney. Bladder is partially distended. Stomach/Bowel: There are changes consistent with left mid abdominal ostomy stable from the prior exam. No obstructive or inflammatory changes of the bowel are noted. The appendix is within normal limits. Vascular/Lymphatic: Aortic atherosclerosis. No enlarged abdominal or pelvic lymph nodes. Reproductive: Prostate is unremarkable. Other: Post treatment changes are noted anterior to the sacrum and stable in appearance from the prior exam. No ascites is noted. No free air is seen. Musculoskeletal: Degenerative changes of the lumbar spine are noted. Review of the MIP images confirms the above findings. IMPRESSION: CTA of the chest: No evidence of pulmonary emboli. T8 spinous process fracture better visualized on CT of the thoracic spine. Dilatation of the ascending aorta to 4.1 cm. Recommend annual imaging followup by CTA or MRA. This recommendation follows 2010 ACCF/AHA/AATS/ACR/ASA/SCA/SCAI/SIR/STS/SVM Guidelines for the Diagnosis and Management of Patients with Thoracic Aortic Disease. Circulation. 2010; 121: [id number] CT of the abdomen and pelvis: Chronic changes similar to that seen on prior exam. No acute abnormality is noted. Electronically Signed   By: Inez Catalina M.D.   On: 10/27/2017 17:27   Scheduled Meds: . amLODipine  5 mg Oral Daily  . atorvastatin  20 mg Oral Daily  . buPROPion  300 mg Oral Daily  . clonazePAM  0.5 mg Oral BID  . divalproex  500 mg Oral TID  . gabapentin  300 mg Oral TID  . insulin aspart  0-9 Units Subcutaneous Q4H  . memantine  10 mg Oral BID  . pantoprazole  40 mg Oral Daily   Continuous Infusions:   LOS: 0 days   Kerney Elbe, DO [facility] PAGER is on AMION  If 7PM-7AM, please contact night-coverage [email or web address] Password [email or web address] 10/28/2017, 6:22 PM

## 2017-10-29 DIAGNOSIS — Z85048 Personal history of other malignant neoplasm of rectum, rectosigmoid junction, and anus: Secondary | ICD-10-CM | POA: Diagnosis not present

## 2017-10-29 DIAGNOSIS — Z974 Presence of external hearing-aid: Secondary | ICD-10-CM | POA: Diagnosis not present

## 2017-10-29 DIAGNOSIS — G3183 Dementia with Lewy bodies: Secondary | ICD-10-CM

## 2017-10-29 DIAGNOSIS — K219 Gastro-esophageal reflux disease without esophagitis: Secondary | ICD-10-CM | POA: Diagnosis present

## 2017-10-29 DIAGNOSIS — R296 Repeated falls: Secondary | ICD-10-CM | POA: Diagnosis present

## 2017-10-29 DIAGNOSIS — F3170 Bipolar disorder, currently in remission, most recent episode unspecified: Secondary | ICD-10-CM | POA: Diagnosis not present

## 2017-10-29 DIAGNOSIS — M1990 Unspecified osteoarthritis, unspecified site: Secondary | ICD-10-CM | POA: Diagnosis present

## 2017-10-29 DIAGNOSIS — Z9221 Personal history of antineoplastic chemotherapy: Secondary | ICD-10-CM | POA: Diagnosis not present

## 2017-10-29 DIAGNOSIS — I10 Essential (primary) hypertension: Secondary | ICD-10-CM | POA: Diagnosis present

## 2017-10-29 DIAGNOSIS — Z993 Dependence on wheelchair: Secondary | ICD-10-CM | POA: Diagnosis not present

## 2017-10-29 DIAGNOSIS — I712 Thoracic aortic aneurysm, without rupture: Secondary | ICD-10-CM | POA: Diagnosis present

## 2017-10-29 DIAGNOSIS — K5900 Constipation, unspecified: Secondary | ICD-10-CM | POA: Diagnosis present

## 2017-10-29 DIAGNOSIS — Z933 Colostomy status: Secondary | ICD-10-CM

## 2017-10-29 DIAGNOSIS — S22069A Unspecified fracture of T7-T8 vertebra, initial encounter for closed fracture: Secondary | ICD-10-CM

## 2017-10-29 DIAGNOSIS — Z87891 Personal history of nicotine dependence: Secondary | ICD-10-CM | POA: Diagnosis not present

## 2017-10-29 DIAGNOSIS — Z9049 Acquired absence of other specified parts of digestive tract: Secondary | ICD-10-CM | POA: Diagnosis not present

## 2017-10-29 DIAGNOSIS — M48061 Spinal stenosis, lumbar region without neurogenic claudication: Secondary | ICD-10-CM | POA: Diagnosis present

## 2017-10-29 DIAGNOSIS — G8929 Other chronic pain: Secondary | ICD-10-CM | POA: Diagnosis present

## 2017-10-29 DIAGNOSIS — H9190 Unspecified hearing loss, unspecified ear: Secondary | ICD-10-CM | POA: Diagnosis present

## 2017-10-29 DIAGNOSIS — E870 Hyperosmolality and hypernatremia: Secondary | ICD-10-CM | POA: Diagnosis present

## 2017-10-29 DIAGNOSIS — F0391 Unspecified dementia with behavioral disturbance: Secondary | ICD-10-CM

## 2017-10-29 DIAGNOSIS — R946 Abnormal results of thyroid function studies: Secondary | ICD-10-CM | POA: Diagnosis present

## 2017-10-29 DIAGNOSIS — F329 Major depressive disorder, single episode, unspecified: Secondary | ICD-10-CM | POA: Diagnosis present

## 2017-10-29 DIAGNOSIS — F0151 Vascular dementia with behavioral disturbance: Secondary | ICD-10-CM | POA: Diagnosis present

## 2017-10-29 DIAGNOSIS — E1165 Type 2 diabetes mellitus with hyperglycemia: Secondary | ICD-10-CM | POA: Diagnosis not present

## 2017-10-29 DIAGNOSIS — S22068A Other fracture of T7-T8 thoracic vertebra, initial encounter for closed fracture: Secondary | ICD-10-CM | POA: Diagnosis present

## 2017-10-29 DIAGNOSIS — W19XXXA Unspecified fall, initial encounter: Secondary | ICD-10-CM | POA: Diagnosis present

## 2017-10-29 DIAGNOSIS — E785 Hyperlipidemia, unspecified: Secondary | ICD-10-CM | POA: Diagnosis present

## 2017-10-29 DIAGNOSIS — Z923 Personal history of irradiation: Secondary | ICD-10-CM | POA: Diagnosis not present

## 2017-10-29 DIAGNOSIS — F319 Bipolar disorder, unspecified: Secondary | ICD-10-CM | POA: Diagnosis present

## 2017-10-29 LAB — CBC WITH DIFFERENTIAL/PLATELET
BASOS ABS: 0 10*3/uL (ref 0.0–0.1)
BASOS PCT: 1 %
EOS ABS: 0.1 10*3/uL (ref 0.0–0.7)
Eosinophils Relative: 2 %
HCT: 41.3 % (ref 39.0–52.0)
Hemoglobin: 13.4 g/dL (ref 13.0–17.0)
Lymphocytes Relative: 32 %
Lymphs Abs: 1.7 10*3/uL (ref 0.7–4.0)
MCH: 29.8 pg (ref 26.0–34.0)
MCHC: 32.4 g/dL (ref 30.0–36.0)
MCV: 91.8 fL (ref 78.0–100.0)
Monocytes Absolute: 1 10*3/uL (ref 0.1–1.0)
Monocytes Relative: 18 %
Neutro Abs: 2.6 10*3/uL (ref 1.7–7.7)
Neutrophils Relative %: 47 %
PLATELETS: 170 10*3/uL (ref 150–400)
RBC: 4.5 MIL/uL (ref 4.22–5.81)
RDW: 13.8 % (ref 11.5–15.5)
WBC: 5.4 10*3/uL (ref 4.0–10.5)

## 2017-10-29 LAB — COMPREHENSIVE METABOLIC PANEL
ALBUMIN: 3.1 g/dL — AB (ref 3.5–5.0)
ALT: 21 U/L (ref 0–44)
AST: 26 U/L (ref 15–41)
Alkaline Phosphatase: 116 U/L (ref 38–126)
Anion gap: 10 (ref 5–15)
BUN: 22 mg/dL (ref 8–23)
CHLORIDE: 99 mmol/L (ref 98–111)
CO2: 31 mmol/L (ref 22–32)
CREATININE: 0.86 mg/dL (ref 0.61–1.24)
Calcium: 8.8 mg/dL — ABNORMAL LOW (ref 8.9–10.3)
GFR calc Af Amer: >60 mL/min (ref >60)
GLUCOSE: 130 mg/dL — AB (ref 70–99)
POTASSIUM: 4.5 mmol/L (ref 3.5–5.1)
Sodium: 140 mmol/L (ref 135–145)
Total Bilirubin: 1 mg/dL (ref 0.3–1.2)
Total Protein: 6.9 g/dL (ref 6.5–8.1)

## 2017-10-29 LAB — GLUCOSE, CAPILLARY
GLUCOSE-CAPILLARY: 144 mg/dL — AB (ref 70–99)
GLUCOSE-CAPILLARY: 281 mg/dL — AB (ref 70–99)
GLUCOSE-CAPILLARY: 159 mg/dL — AB (ref 70–99)
GLUCOSE-CAPILLARY: 207 mg/dL — AB (ref 70–99)
Glucose-Capillary: 113 mg/dL — ABNORMAL HIGH (ref 70–99)

## 2017-10-29 LAB — PHOSPHORUS: Phosphorus: 3.4 mg/dL (ref 2.5–4.6)

## 2017-10-29 LAB — HEMOGLOBIN A1C
HEMOGLOBIN A1C: 8.2 % — AB (ref 4.8–5.6)
MEAN PLASMA GLUCOSE: 189 mg/dL

## 2017-10-29 LAB — T3: T3 TOTAL: 161 ng/dL (ref 71–180)

## 2017-10-29 LAB — MAGNESIUM: Magnesium: 2.1 mg/dL (ref 1.7–2.4)

## 2017-10-29 MED ORDER — INSULIN ASPART 100 UNIT/ML ~~LOC~~ SOLN
0.0000 [IU] | Freq: Three times a day (TID) | SUBCUTANEOUS | Status: DC
  Administered 2017-10-29: 2 [IU] via SUBCUTANEOUS
  Administered 2017-10-30: 1 [IU] via SUBCUTANEOUS
  Administered 2017-10-30: 2 [IU] via SUBCUTANEOUS
  Administered 2017-10-30 – 2017-10-31 (×2): 3 [IU] via SUBCUTANEOUS
  Administered 2017-10-31: 1 [IU] via SUBCUTANEOUS

## 2017-10-29 NOTE — NC FL2 (Addendum)
[place] LEVEL OF CARE SCREENING TOOL     IDENTIFICATION  Patient Name: Ricky Conley Birthdate: 10/08/1945 Sex: male Admission Date (Current Location): 10/27/2017  [facility] and [place] Number:  Herbalist and Address:  [facility],  [place] [address], [place]      Provider Number: [id number]  Attending Physician Name and Address:  Debbe Odea, MD  Relative Name and Phone Number:       Current Level of Care: Hospital Recommended Level of Care: [place] Prior Approval Number:    Date Approved/Denied:   PASRR Number:   [id number]   Discharge Plan: SNF    Current Diagnoses: Patient Active Problem List   Diagnosis Date Noted  . T8 vertebral fracture ([place]) 10/27/2017  . Constipation 10/27/2017  . 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 Frequent falls 07/05/2015  . Acute encephalopathy 06/29/2015  . Encephalopathy 06/29/2015  . Choledocholithiasis   . Dilated bile duct   . Dementia with behavioral disturbance 06/28/2015  . Generalized anxiety disorder 06/15/2015  . Rectal cancer ([place]) 06/12/2015  . Abnormal CT scan 06/09/2015  . Elevated LFTs 06/09/2015  . Transaminitis 04/20/2015  . Hypotension 04/18/2015  . Weakness generalized 04/18/2015  . Loss of balance 04/18/2015  . Generalized weakness 04/18/2015  . Fever 04/09/2015  . Influenza-like illness 04/09/2015  . Delirium 04/09/2015  . Hypernatremia 02/18/2013  . Altered mental status 02/18/2013  . Bipolar disorder ([place]) 02/18/2013  . 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 Hyperlipidemia 02/18/2013  . Ascending aortic aneurysm ([place]) 08/12/2012    Orientation RESPIRATION BLADDER Height & Weight     Self, Place  Normal Incontinent Weight: 180 lb (81.6 kg) Height:  5\' 10"  (177.8 cm)  BEHAVIORAL SYMPTOMS/MOOD NEUROLOGICAL BOWEL NUTRITION STATUS      Colostomy Diet(Carb modified diet)  AMBULATORY STATUS COMMUNICATION OF NEEDS Skin   Extensive  Assist Verbally                         Personal Care Assistance Level of Assistance  Bathing, Feeding, Dressing Bathing Assistance: Limited assistance Feeding assistance: Independent Dressing Assistance: Limited assistance     Functional Limitations Info  Hearing   Hearing Info: Impaired      SPECIAL CARE FACTORS FREQUENCY  OT (By licensed OT), PT (By licensed PT)     PT Frequency: 5x OT Frequency: 5x            Contractures Contractures Info: Not present    Additional Factors Info  Code Status, Allergies Code Status Info: Full Allergies Info: No Known Allergies           Current Medications (10/29/2017):  This is the current hospital active medication list Current Facility-Administered Medications  Medication Dose Route Frequency Provider Last Rate Last Dose  . acetaminophen (TYLENOL) tablet 650 mg  650 mg Oral Q6H PRN Toy Baker, MD   650 mg at 10/28/17 1537   Or  . acetaminophen (TYLENOL) suppository 650 mg  650 mg Rectal Q6H PRN Doutova, Anastassia, MD      . amLODipine (NORVASC) tablet 5 mg  5 mg Oral Daily Doutova, Anastassia, MD   5 mg at 10/29/17 0935  . atorvastatin (LIPITOR) tablet 20 mg  20 mg Oral Daily Doutova, Anastassia, MD   20 mg at 10/29/17 0935  . buPROPion (WELLBUTRIN XL) 24 hr tablet 300 mg  300 mg Oral Daily Doutova, Anastassia, MD   300 mg at 10/29/17 0934  .  clonazePAM (KLONOPIN) tablet 0.5 mg  0.5 mg Oral BID Toy Baker, MD   0.5 mg at 10/29/17 0934  . divalproex (DEPAKOTE) DR tablet 500 mg  500 mg Oral TID Toy Baker, MD   500 mg at 10/29/17 0934  . gabapentin (NEURONTIN) capsule 300 mg  300 mg Oral TID Toy Baker, MD   300 mg at 10/29/17 0935  . HYDROcodone-acetaminophen (NORCO/VICODIN) 5-325 MG per tablet 1-2 tablet  1-2 tablet Oral Q4H PRN Toy Baker, MD   1 tablet at 10/29/17 0546  . insulin aspart (novoLOG) injection 0-9 Units  0-9 Units Subcutaneous TID WC Rizwan, Saima, MD      .  memantine (NAMENDA) tablet 10 mg  10 mg Oral BID Toy Baker, MD   10 mg at 10/29/17 0935  . methocarbamol (ROBAXIN) tablet 500 mg  500 mg Oral Q6H PRN Toy Baker, MD   500 mg at 10/29/17 0943  . metoCLOPramide (REGLAN) injection 5 mg  5 mg Intravenous Q6H PRN Doutova, Anastassia, MD      . morphine 2 MG/ML injection 1 mg  1 mg Intravenous Q4H PRN Toy Baker, MD   1 mg at 10/27/17 2303  . ondansetron (ZOFRAN) tablet 4 mg  4 mg Oral Q6H PRN Doutova, Anastassia, MD       Or  . ondansetron (ZOFRAN) injection 4 mg  4 mg Intravenous Q6H PRN Doutova, Anastassia, MD      . pantoprazole (PROTONIX) EC tablet 40 mg  40 mg Oral Daily Doutova, Anastassia, MD   40 mg at 10/29/17 0935  . polyethylene glycol (MIRALAX / GLYCOLAX) packet 17 g  17 g Oral BID Raiford Noble [place], DO   17 g at 10/29/17 5638     Discharge Medications: Please see discharge summary for a list of discharge medications.  Relevant Imaging Results:  Relevant Lab Results:   Additional Information SSN [id number]  Joellen [place], [place]

## 2017-10-29 NOTE — Progress Notes (Addendum)
PROGRESS NOTE    [name]   WUJ:[id number]  DOB: 04/21/1945  DOA: 10/27/2017 PCP: Shirline Frees, MD   Brief Narrative:  Ricky Conley is a 72 y.o.malewith medical history significant of Lewy body disease with frequent falls and dementia as well as colorectal cancer with colostomy bag,lumbar spinal stenosis,Ascending aortic aneurysm, DM 2, HTN, HLD who presents with a fall and lower back pain. He was found to have a T7-8 compression fractures.   Subjective: He has no complaints today ROS: no complaints of nausea, vomiting, constipation diarrhea, cough, dyspnea or dysuria. No other complaints.   Assessment & Plan:   Principal Problem:   T8 vertebral fracture  - due to fall - MRI   >>  "Chance like thoracic spine fracture involving T8 spinous process, extending anteriorly through the T7-8 disc space, and only minor edema of the inferior T7 and superior T8 endplates reflecting the small fractures immediately adjacent to the interspace.Marland KitchenMarland KitchenMarland KitchenPrevertebral hematoma in the posterior mediastinum opposite the T7-8 disc space. 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 twice daily, bupropion 300 g p.o. daily, clonazepam 0.5 g p.o. twice daily, Depakote 500 mg p.o. 3 times daily   Bipolar disorder ([place]) -Continue home medications currently stable and continue home bupropion, clonazepam, Depakote  Ascending aortic aneurysm ([place]) -Will need follow-up as an outpatient   Hyperlipidemia -stable continue home medications with atorvastatin 20 g p.o.  nightly  Constipationdecreased bowel movements and abdominal distention patient status post colostomy. Suspect pain induced constipation will order bowel regimen with MiraLAX twice daily and continue  DM 2 - -Order Sensitive SSI   - HgA1C 8.2    Hypertension -Continue amlodipine  GERD -Continue with pantoprazole 40 g p.o. Daily  Abnormal thyroid function studies -TSH was 17.551 -Free T4 normal at 0.91 -Repeat studies and 4 to 6 weeks and if necessary start a medication follow-up with endocrinology   DVT prophylaxis: SCDs Code Status: Full code Family Communication: wife Disposition Plan: will likely go to SNF Consultants:   neurosurgery Procedures:    Antimicrobials:  Anti-infectives (From admission, onward)   None     Objective: Vitals:   10/28/17 1100 10/28/17 1447 10/28/17 2148 10/29/17 0420  BP:  (!) 145/80 136/74 (!) 158/79  Pulse:  88 92 86  Resp:      Temp:  98.3 F (36.8 C) 97.8 F (36.6 C) 98.1 F (36.7 C)  TempSrc:  Oral Oral Oral  SpO2:  96%  96%  Weight: 81.6 kg     Height: 5\' 10"  (1.778 m)       Intake/Output Summary (Last 24 hours) at 10/29/2017 1233 Last data filed at 10/29/2017 1128 Gross per 24 hour  Intake 360 ml  Output 1225 ml  Net -865 ml   Filed Weights   10/27/17 1224 10/28/17 1100  Weight: 82.1 kg 81.6 kg    Examination: General exam: Appears comfortable - very hard of hearing HEENT: PERRLA, oral mucosa moist, no sclera icterus or thrush Respiratory system: Clear to auscultation. Respiratory effort normal. Cardiovascular system: S1 & S2 heard, RRR.  Gastrointestinal system: Abdomen soft, non-tender, nondistended. Normal bowel sound. Colostomy noted. Central nervous system: Alert and oriented. No focal neurological deficits. Extremities: No cyanosis, clubbing or edema Skin: No rashes or ulcers Psychiatry:  Mood & affect appropriate.     Data Reviewed: I have personally reviewed following labs and imaging  studies  CBC: Recent Labs  Lab 10/27/17 1424 10/28/17 0507 10/29/17 0447  WBC 7.1 6.2 5.4  NEUTROABS 4.1  --  2.6  HGB 14.1 13.6 13.4  HCT 43.6 42.3 41.3  MCV 92.8 92.6 91.8  PLT 161 177 161   Basic Metabolic Panel: Recent Labs  Lab 10/27/17 1424 10/28/17 0507 10/29/17 0447  NA 141 141 140  K 4.7 5.1 4.5  CL 101 100 99  CO2 30 33* 31  GLUCOSE 150* 171* 130*  BUN 24* 28* 22  CREATININE 0.95 1.04 0.86  CALCIUM 9.3 9.4 8.8*  MG  --  2.0 2.1  PHOS  --  3.9 3.4   GFR: Estimated Creatinine Clearance: 77.8 mL/min (by C-G formula based on SCr of 0.86 mg/dL). Liver Function Tests: Recent Labs  Lab 10/27/17 1424 10/28/17 0507 10/29/17 0447  AST 21 24 26   ALT 20 20 21   ALKPHOS 125 116 116  BILITOT 1.2 1.0 1.0  PROT 7.2 7.0 6.9  ALBUMIN 3.5 3.4* 3.1*   Recent Labs  Lab 10/27/17 1424  LIPASE 31   No results for input(s): AMMONIA in the last 168 hours. Coagulation Profile: No results for input(s): INR, PROTIME in the last 168 hours. Cardiac Enzymes: No results for input(s): CKTOTAL, CKMB, CKMBINDEX, TROPONINI in the last 168 hours. BNP (last 3 results) No results for input(s): PROBNP in the last 8760 hours. HbA1C: Recent Labs    10/28/17 0507  HGBA1C 8.2*   CBG: Recent Labs  Lab 10/28/17 1824 10/28/17 2110 10/28/17 2355 10/29/17 0358 10/29/17 0745  GLUCAP 191* 184* 157* 113* 144*   Lipid Profile: No results for input(s): CHOL, HDL, LDLCALC, TRIG, CHOLHDL, LDLDIRECT in the last 72 hours. Thyroid Function Tests: Recent Labs    10/28/17 0507  TSH 17.551*  FREET4 0.91   Anemia Panel: No results for input(s): VITAMINB12, FOLATE, FERRITIN, TIBC, IRON, RETICCTPCT in the last 72 hours. Urine analysis:    Component Value Date/Time   COLORURINE YELLOW 10/27/2017 1424   APPEARANCEUR CLEAR 10/27/2017 1424   LABSPEC 1.029 10/27/2017 1424   PHURINE 5.0 10/27/2017 1424   GLUCOSEU 50 (A) 10/27/2017 1424   HGBUR NEGATIVE 10/27/2017 1424   BILIRUBINUR  NEGATIVE 10/27/2017 1424   KETONESUR 5 (A) 10/27/2017 1424   PROTEINUR NEGATIVE 10/27/2017 1424   UROBILINOGEN 2.0 (H) 02/18/2013 1611   NITRITE NEGATIVE 10/27/2017 1424   LEUKOCYTESUR NEGATIVE 10/27/2017 1424   Sepsis Labs: @LABRCNTIP (procalcitonin:4,lacticidven:4) )No results found for this or any previous visit (from the past 240 hour(s)).       Radiology Studies: Dg Thoracic Spine 4v  Result Date: 10/28/2017 CLINICAL DATA:  T8 fracture. EXAM: THORACIC SPINE - 4+ VIEW COMPARISON:  Thoracic spine CT and MRI 10/27/2017 FINDINGS: AP and lateral radiographs in both the supine and standing positions are provided. There is no listhesis. As shown on the earlier CT and MRI, there is a fracture through the T7-8 disc space level with involvement of the T7 inferior and T8 superior endplates. The T8 spinous process fracture is much better demonstrated on CT than on these radiographs. There is no vertebral collapse, including with standing. Diffuse bridging vertebral osteophytes are noted throughout the thoracic spine. The visualized  portions of the lungs are grossly clear. IMPRESSION: Chance like fracture involving the T7-8 disc space and T8 spinous process as on prior CT. No evidence of vertebral collapse or subluxation with standing. Electronically Signed   By: Logan Bores M.D.   On: 10/28/2017 20:19   Ct Angio Chest Pe W/cm &/or Wo Cm  Result Date: 10/27/2017 CLINICAL DATA:  Chest pain following fall several days ago, initial encounter EXAM: CT ANGIOGRAPHY CHEST CT ABDOMEN AND PELVIS WITH CONTRAST TECHNIQUE: Multidetector CT imaging of the chest was performed using the standard protocol during bolus administration of intravenous contrast. Multiplanar CT image reconstructions and MIPs were obtained to evaluate the vascular anatomy. Multidetector CT imaging of the abdomen and pelvis was performed using the standard protocol during bolus administration of intravenous contrast. CONTRAST:  158mL  ISOVUE-370 IOPAMIDOL (ISOVUE-370) INJECTION 76% COMPARISON:  03/22/2016 CT of the abdomen FINDINGS: CTA CHEST FINDINGS Cardiovascular: Mild dilatation of the ascending aorta 4.1 cm is noted. Normal tapering in the aortic arch is seen. Atherosclerotic calcifications are noted. No findings to suggest dissection are seen. No cardiac enlargement is noted. Diffuse coronary calcifications are seen. The pulmonary artery demonstrates a normal branching pattern without intraluminal filling defect to suggest pulmonary embolism. Mediastinum/Nodes: Thoracic inlet is within normal limits. No sizable hilar or mediastinal adenopathy is noted. The esophagus as visualized is within normal limits. Lungs/Pleura: Lungs are well aerated bilaterally. No focal infiltrate or sizable effusion is seen. No sizable parenchymal nodules are noted. Musculoskeletal: Degenerative changes of the thoracic spine are seen. No acute rib fracture or compression deformity is seen. There is a fracture through the spinous process of T8 which is better visualized on the CT of the thoracic spine. Review of the MIP images confirms the above findings. CT ABDOMEN and PELVIS FINDINGS Hepatobiliary: Diffuse fatty infiltration of the liver is noted. The gallbladder is well distended and demonstrates a single focus of non dependent air. This is likely related to prior instrumentation and sphincterotomy and has decreased significantly in the interval from the prior CT examination. Pancreas: Unremarkable. No pancreatic ductal dilatation or surrounding inflammatory changes. Spleen: Normal in size without focal abnormality. Adrenals/Urinary Tract: Adrenal glands are within normal limits. Kidneys demonstrate a normal enhancement pattern bilaterally. Small renal cyst is noted in the lower pole of the right kidney. Bladder is partially distended. Stomach/Bowel: There are changes consistent with left mid abdominal ostomy stable from the prior exam. No obstructive or  inflammatory changes of the bowel are noted. The appendix is within normal limits. Vascular/Lymphatic: Aortic atherosclerosis. No enlarged abdominal or pelvic lymph nodes. Reproductive: Prostate is unremarkable. Other: Post treatment changes are noted anterior to the sacrum and stable in appearance from the prior exam. No ascites is noted. No free air is seen. Musculoskeletal: Degenerative changes of the lumbar spine are noted. Review of the MIP images confirms the above findings. IMPRESSION: CTA of the chest: No evidence of pulmonary emboli. T8 spinous process fracture better visualized on CT of the thoracic spine. Dilatation of the ascending aorta to 4.1 cm. Recommend annual imaging followup by CTA or MRA. This recommendation follows 2010 ACCF/AHA/AATS/ACR/ASA/SCA/SCAI/SIR/STS/SVM Guidelines for the Diagnosis and Management of Patients with Thoracic Aortic Disease. Circulation. 2010; 121: J188-C166 CT of the abdomen and pelvis: Chronic changes similar to that seen on prior exam. No acute abnormality is noted. Electronically Signed   By: Inez Catalina M.D.   On: 10/27/2017 17:27   Ct Thoracic Spine Wo Contrast  Result Date: 10/27/2017 CLINICAL DATA:  Dementia patient  status post fall with persistent back pain EXAM: CT THORACIC AND LUMBAR SPINE WITHOUT CONTRAST TECHNIQUE: Multidetector CT imaging of the thoracic and lumbar spine was performed without contrast. Multiplanar CT image reconstructions were also generated. COMPARISON:  Radiography 10/23/2017 FINDINGS: CT THORACIC SPINE FINDINGS Alignment: Normal Vertebrae: Solid bridging osteophytes throughout the entire thoracic region extending to L1. Nondisplaced fracture of the spinous process of T8. Fracture extends horizontally through the T7-8 disc level. No displacement. No canal compromise. Paraspinal and other soft tissues: Negative Disc levels: No stenosis of the canal or foramina. Small dural calcification posteriorly at T10. Not likely significant. This  was present in 2017. CT LUMBAR SPINE FINDINGS Segmentation: 5 lumbar type vertebral bodies. Alignment: Normal except for 2 mm anterolisthesis L4-5. Vertebrae: No lumbosacral fracture in the region studied. Paraspinal and other soft tissues: Negative Disc levels: Moderate multifactorial stenosis at L2-3, L3-4. At L4-5, there is severe multifactorial stenosis. At L5-S1 there is stenosis of the lateral recesses and foramina. Ankylosis of the sacroiliac joints. IMPRESSION: CT THORACIC SPINE IMPRESSION Fracture of the spinous process of T8, nondisplaced. Fracture extends transversely through the T7-8 disc level, Chance fracture equivalent. No displacement. No canal compromise. No unstable fracture seen. 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 CT LUMBAR SPINE IMPRESSION No lumbar spine fracture. 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 Bilateral lateral recess and foraminal stenosis at L5-S1. Mild to moderate canal stenosis at L2-3 and L3-4. Electronically Signed   By: Nelson Chimes M.D.   On: 10/27/2017 17:17   Ct Lumbar Spine Wo Contrast  Result Date: 10/27/2017 CLINICAL DATA:  Dementia patient status post fall with persistent back pain EXAM: CT THORACIC AND LUMBAR SPINE WITHOUT CONTRAST TECHNIQUE: Multidetector CT imaging of the thoracic and lumbar spine was performed without contrast. Multiplanar CT image reconstructions were also generated. COMPARISON:  Radiography 10/23/2017 FINDINGS: CT THORACIC SPINE FINDINGS Alignment: Normal Vertebrae: Solid bridging osteophytes throughout the entire thoracic region extending to L1. Nondisplaced fracture of the spinous process of T8. Fracture extends horizontally through the T7-8 disc level. No displacement. No canal compromise. Paraspinal and other soft tissues: Negative Disc levels: No stenosis of the canal or foramina. Small dural calcification posteriorly at T10. Not likely significant. This was present in 2017. CT LUMBAR SPINE FINDINGS Segmentation: 5 lumbar type  vertebral bodies. Alignment: Normal except for 2 mm anterolisthesis L4-5. Vertebrae: No lumbosacral fracture in the region studied. Paraspinal and other soft tissues: Negative Disc levels: Moderate multifactorial stenosis at L2-3, L3-4. At L4-5, there is severe multifactorial stenosis. At L5-S1 there is stenosis of the lateral recesses and foramina. Ankylosis of the sacroiliac joints. IMPRESSION: CT THORACIC SPINE IMPRESSION Fracture of the spinous process of T8, nondisplaced. Fracture extends transversely through the T7-8 disc level, Chance fracture equivalent. No displacement. No canal compromise. No unstable fracture seen. 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 CT LUMBAR SPINE IMPRESSION No lumbar spine fracture. 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 Bilateral lateral recess and foraminal stenosis at L5-S1. Mild to moderate canal stenosis at L2-3 and L3-4. Electronically Signed   By: Nelson Chimes M.D.   On: 10/27/2017 17:17   Mr Thoracic Spine Wo Contrast  Result Date: 10/27/2017 CLINICAL DATA:  Thoracic spine fracture, continued surveillance EXAM: MRI THORACIC SPINE WITHOUT CONTRAST TECHNIQUE: Multiplanar, multisequence MR imaging of the thoracic spine was performed. No intravenous contrast was administered. COMPARISON:  CT of the thoracic spine 10/27/2017. FINDINGS: Alignment:  Anatomic Vertebrae: T8 spinous process fracture is nondisplaced, barely visible on MR. Minor endplate fractures of T7 inferiorly  and T8 superiorly reflect the osseous injury observed on CT. There is no significant wedging. Posterior elements are otherwise intact. No other occult fractures are evident. Cord: No cord injury. 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 This is an incidental finding. Paraspinal and other soft tissues: Prevertebral hematoma can be seen anterior to the T7 and T8 vertebral bodies. There is no intraspinal hematoma. No visible disruption of the  posterior longitudinal ligament, or interspinous ligaments. The anterior longitudinal ligament is calcified/ossified, and discontinuous Disc levels: No disc protrusion or spinal stenosis. Functional fusion across the T8-9 and T9-10 levels. IMPRESSION: Chance like thoracic spine fracture involving T8 spinous process, extending anteriorly through the T7-8 disc space, and only minor edema of the inferior T7 and superior T8 endplates reflecting the small fractures immediately adjacent to the interspace. No significant wedging of the vertebral bodies, or visible injury elsewhere in the thoracic spine. No features to suggest disruption of the posterior longitudinal ligament or posterior interspinous/facet related ligaments at the T7-8 level. There is no vertebral displacement/subluxation. Prevertebral hematoma in the posterior mediastinum opposite the T7-8 disc space. 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 Electronically Signed   By: Staci Righter M.D.   On: 10/27/2017 21:50   Ct Abdomen Pelvis W Contrast  Result Date: 10/27/2017 CLINICAL DATA:  Chest pain following fall several days ago, initial encounter EXAM: CT ANGIOGRAPHY CHEST CT ABDOMEN AND PELVIS WITH CONTRAST TECHNIQUE: Multidetector CT imaging of the chest was performed using the standard protocol during bolus administration of intravenous contrast. Multiplanar CT image reconstructions and MIPs were obtained to evaluate the vascular anatomy. Multidetector CT imaging of the abdomen and pelvis was performed using the standard protocol during bolus administration of intravenous contrast. CONTRAST:  176mL ISOVUE-370 IOPAMIDOL (ISOVUE-370) INJECTION 76% COMPARISON:  03/22/2016 CT of the abdomen FINDINGS: CTA CHEST FINDINGS Cardiovascular: Mild dilatation of the ascending aorta 4.1 cm is noted. Normal tapering in the aortic arch is seen. Atherosclerotic calcifications are noted. No findings to suggest dissection are  seen. No cardiac enlargement is noted. Diffuse coronary calcifications are seen. The pulmonary artery demonstrates a normal branching pattern without intraluminal filling defect to suggest pulmonary embolism. Mediastinum/Nodes: Thoracic inlet is within normal limits. No sizable hilar or mediastinal adenopathy is noted. The esophagus as visualized is within normal limits. Lungs/Pleura: Lungs are well aerated bilaterally. No focal infiltrate or sizable effusion is seen. No sizable parenchymal nodules are noted. Musculoskeletal: Degenerative changes of the thoracic spine are seen. No acute rib fracture or compression deformity is seen. There is a fracture through the spinous process of T8 which is better visualized on the CT of the thoracic spine. Review of the MIP images confirms the above findings. CT ABDOMEN and PELVIS FINDINGS Hepatobiliary: Diffuse fatty infiltration of the liver is noted. The gallbladder is well distended and demonstrates a single focus of non dependent air. This is likely related to prior instrumentation and sphincterotomy and has decreased significantly in the interval from the prior CT examination. Pancreas: Unremarkable. No pancreatic ductal dilatation or surrounding inflammatory changes. Spleen: Normal in size without focal abnormality. Adrenals/Urinary Tract: Adrenal glands are within normal limits. Kidneys demonstrate a normal enhancement pattern bilaterally. Small renal cyst is noted in the lower pole of the right kidney. Bladder is partially distended. Stomach/Bowel: There are changes consistent with left mid abdominal ostomy stable from the prior exam. No obstructive or inflammatory changes of the bowel are noted. The  appendix is within normal limits. Vascular/Lymphatic: Aortic atherosclerosis. No enlarged abdominal or pelvic lymph nodes. Reproductive: Prostate is unremarkable. Other: Post treatment changes are noted anterior to the sacrum and stable in appearance from the prior exam.  No ascites is noted. No free air is seen. Musculoskeletal: Degenerative changes of the lumbar spine are noted. Review of the MIP images confirms the above findings. IMPRESSION: CTA of the chest: No evidence of pulmonary emboli. T8 spinous process fracture better visualized on CT of the thoracic spine. Dilatation of the ascending aorta to 4.1 cm. Recommend annual imaging followup by CTA or MRA. This recommendation follows 2010 ACCF/AHA/AATS/ACR/ASA/SCA/SCAI/SIR/STS/SVM Guidelines for the Diagnosis and Management of Patients with Thoracic Aortic Disease. Circulation. 2010; 121: O160-V371 CT of the abdomen and pelvis: Chronic changes similar to that seen on prior exam. No acute abnormality is noted. Electronically Signed   By: Inez Catalina M.D.   On: 10/27/2017 17:27      Scheduled Meds: . amLODipine  5 mg Oral Daily  . atorvastatin  20 mg Oral Daily  . buPROPion  300 mg Oral Daily  . clonazePAM  0.5 mg Oral BID  . divalproex  500 mg Oral TID  . gabapentin  300 mg Oral TID  . insulin aspart  0-9 Units Subcutaneous Q4H  . memantine  10 mg Oral BID  . pantoprazole  40 mg Oral Daily  . polyethylene glycol  17 g Oral BID   Continuous Infusions:   LOS: 0 days    Time spent in minutes: 35    Debbe Odea, MD [facility] Pager: [email or web address] Password [facility] 10/29/2017, 12:33 PM

## 2017-10-29 NOTE — Progress Notes (Signed)
Patient ID: Ricky Conley, male   DOB: Feb 25, 1945, 72 y.o.   MRN: [id number]   Upright xrays good, mobilize with PT

## 2017-10-29 NOTE — Plan of Care (Signed)
Pt alert, forgetful at times. Wife at the bedside. Pain well controlled with PO pain meds.  Back brace in place. RN will monitor.

## 2017-10-30 DIAGNOSIS — K5900 Constipation, unspecified: Secondary | ICD-10-CM

## 2017-10-30 DIAGNOSIS — E785 Hyperlipidemia, unspecified: Secondary | ICD-10-CM

## 2017-10-30 LAB — GLUCOSE, CAPILLARY
GLUCOSE-CAPILLARY: 133 mg/dL — AB (ref 70–99)
Glucose-Capillary: 198 mg/dL — ABNORMAL HIGH (ref 70–99)
Glucose-Capillary: 237 mg/dL — ABNORMAL HIGH (ref 70–99)
Glucose-Capillary: 238 mg/dL — ABNORMAL HIGH (ref 70–99)

## 2017-10-30 MED ORDER — NAPHAZOLINE-GLYCERIN 0.012-0.2 % OP SOLN
1.0000 [drp] | Freq: Four times a day (QID) | OPHTHALMIC | Status: DC | PRN
  Filled 2017-10-30: qty 15

## 2017-10-30 MED ORDER — MAGNESIUM CITRATE PO SOLN: 1.0000 | Freq: Every day | ORAL | Status: DC | PRN

## 2017-10-30 NOTE — Evaluation (Signed)
Physical Therapy Evaluation Patient Details Name: Ricky Conley MRN: [id number] DOB: [birth year] Today's Date: 10/30/2017   History of Present Illness  72 y.o. male with medical history significant of Lewy body disease (diagnosed 05/2017) with frequent falls and dementia as well as colorectal cancer with colostomy bag, lumbar spinal stenosis, Ascending aortic aneurysm, DM 2, HTN, HLD who presents with a fall and lower back pain. He was found to have a T7-8 compression fractures.  Clinical Impression  Pt admitted with above diagnosis. Pt currently with functional limitations due to the deficits listed below (see PT Problem List).  Pt will benefit from skilled PT to increase their independence and safety with mobility to allow discharge to the venue listed below.  Pt and spouse report pt with 16 falls in the last month.  Pt's mobility has been significantly declining reportedly due to Lewy Body disease.  Pt has not followed up with neurologist since official diagnosis in April per spouse.  Pt with limited mobility today due to back pain.  Pt in TLSO on arrival and agreeable to transfer to recliner for OOB.  Pt requiring mod assist at this time.  Recommend SNF upon d/c as spouse is unable to provide current assist level.     Follow Up Recommendations SNF    Equipment Recommendations  None recommended by PT    Recommendations for Other Services       Precautions / Restrictions Precautions Precautions: Back Required Braces or Orthoses: Spinal Brace Spinal Brace: Thoracolumbosacral orthotic(remained in place) Restrictions Weight Bearing Restrictions: No      Mobility  Bed Mobility Overal bed mobility: Needs Assistance Bed Mobility: Rolling;Sidelying to Sit Rolling: Min assist Sidelying to sit: Mod assist       General bed mobility comments: verbal and tactile cues for log roll technique, assist for rolling upper body and bringing upper body upright  Transfers Overall transfer level:  Needs assistance Equipment used: Rolling walker (2 wheeled) Transfers: Sit to/from [facility] Sit to Stand: Mod assist Stand pivot transfers: Mod assist       General transfer comment: verbal cues for technique and using LEs to perform transfer with straight spine (brace in place as well), performed standing once with 2 HHA and then again with pivot to recliner using RW  Ambulation/Gait             General Gait Details: pt reports unable due to pain at this time  Stairs            Wheelchair Mobility    Modified Rankin (Stroke Patients Only)       Balance Overall balance assessment: History of Falls                                           Pertinent Vitals/Pain Pain Assessment: Faces Faces Pain Scale: Hurts little more Pain Location: around T8 area and R shoulder blade Pain Descriptors / Indicators: Sharp Pain Intervention(s): Limited activity within patient's tolerance;Monitored during session;Repositioned    Home Living Family/patient expects to be discharged to:: Private residence Living Arrangements: Spouse/significant other   Type of Home: House Home Access: Ramped entrance     Home Layout: One level Home Equipment: Environmental consultant - 2 wheels;Cane - single point;Wheelchair - manual      Prior Function Level of Independence: Needs assistance   Gait / Transfers Assistance Needed: spouse reports pt with increased  falls lately, pt requiring assist for balance with standing during ADLs, pt uses multiple assistive devices at home, ambulates with RW however spouse reports increased falls due to "freezing" of LEs while UEs continue forward with RW           Hand Dominance        Extremity/Trunk Assessment        Lower Extremity Assessment Lower Extremity Assessment: Generalized weakness       Communication   Communication: HOH  Cognition Arousal/Alertness: Awake/alert Behavior During Therapy: WFL for tasks  assessed/performed Overall Cognitive Status: Within Functional Limits for tasks assessed                                 General Comments: hx of dementia however appropriate for session, no memory issues per spouse      General Comments      Exercises     Assessment/Plan    PT Assessment Patient needs continued PT services  PT Problem List Decreased strength;Decreased mobility;Decreased activity tolerance;Decreased balance;Decreased knowledge of use of DME;Decreased knowledge of precautions;Pain       PT Treatment Interventions DME instruction;Therapeutic activities;Gait training;Therapeutic exercise;Patient/family education;Balance training;Wheelchair mobility training;Functional mobility training    PT Goals (Current goals can be found in the Care Plan section)  Acute Rehab PT Goals PT Goal Formulation: With patient/family Time For Goal Achievement: 11/06/17 Potential to Achieve Goals: Good    Frequency Min 3X/week   Barriers to discharge        Co-evaluation               AM-PAC PT "6 Clicks" Daily Activity  Outcome Measure Difficulty turning over in bed (including adjusting bedclothes, sheets and blankets)?: Unable Difficulty moving from lying on back to sitting on the side of the bed? : Unable Difficulty sitting down on and standing up from a chair with arms (e.g., wheelchair, bedside commode, etc,.)?: Unable Help needed moving to and from a bed to chair (including a wheelchair)?: A Lot Help needed walking in hospital room?: Total Help needed climbing 3-5 steps with a railing? : Total 6 Click Score: 7    End of Session Equipment Utilized During Treatment: Gait belt Activity Tolerance: Patient tolerated treatment well Patient left: in chair;with call bell/phone within reach;with chair alarm set Nurse Communication: Mobility status PT Visit Diagnosis: Repeated falls (R29.6);Unsteadiness on feet (R26.81)    Time: 4174-0814 PT Time  Calculation (min) (ACUTE ONLY): 17 min   Charges:   PT Evaluation $PT Eval Moderate Complexity: [place], PT, DPT Acute Rehabilitation Services Office: [phone] Pager: [phone]  Trena Platt 10/30/2017, 11:44 AM

## 2017-10-30 NOTE — Clinical Social Work Note (Signed)
Clinical Social Work Assessment  Patient Details  Name: Ricky Conley MRN: [id number] Date of Birth: 02/25/1945  Date of referral:  10/30/17               Reason for consult:  Facility Placement                Permission sought to share information with:  Family Supports, Customer service manager Permission granted to share information::  Yes, Verbal Permission Granted  Name::     Ricky Conley::  SNF  Relationship::  Spouse   Contact Information:  [phone]  [phone]   Housing/Transportation Living arrangements for the past 2 months:  [place], [place] of Information:  Patient Patient Interpreter Needed:  None Criminal Activity/Legal Involvement Pertinent to Current Situation/Hospitalization:  No - Comment as needed Significant Relationships:  Adult Children Lives with:  Facility Resident Do you feel safe going back to the place where you live?  Yes Need for family participation in patient care:  Yes (patient has Lewy Body dementia and confusion)  Care giving concerns:   Patient admitted for back pain after having a fall. He was found to have T7-8 compression fracture.  The patient has significant low back pain radiating to his thoracic area and shoulder blades. Physical therapy recommends SNF placement.    Social Worker assessment / plan:  CSW discuss discharge planning with the patient spouse. Patient spouse report the patient has dementia and get confused at times. She reports the patient was recently at [facility] for rehab. She reports the patient has fallen sixteen times in the past month. Spouse reports it is difficult to get the patient up on her own and provide his daily care. She reports the patient does not have other family members in the area. Patient spouse reports she is unable to care for the patient at home and his hopeful to transition him to long term care.  CSW explain SNF process and provided list of bed offers.    Plan: SNF   Insurance Authorization called into [facility] on call nurse. They are requesting PT notes.  FL2 complete.  PASRR done.    Employment status:  Retired Forensic scientist:  Other (Comment Required)([employer] ) PT Recommendations:  [place] / Referral to community resources:  [place]  Patient/Family's Response to care:  Agreeable and responding well to care.   Patient/Family's Understanding of and Emotional Response to Diagnosis, Current Treatment, and Prognosis:  Patient defers to his spouse for understanding. Patient spouse is very involved in patient care.   Emotional Assessment Appearance:  Appears stated age Attitude/Demeanor/Rapport:    Affect (typically observed):  Calm Orientation:  Oriented to Self, Oriented to Place, Oriented to Situation Alcohol / Substance use:  Not Applicable Psych involvement (Current and /or in the community):  No (Comment)  Discharge Needs  Concerns to be addressed:  Discharge Planning Concerns Readmission within the last 30 days:  No Current discharge risk:  Dependent with Mobility, Physical Impairment, Cognitively Impaired Barriers to Discharge:  Ricky Conley, Ricky Conley 10/30/2017, 11:25 AM

## 2017-10-30 NOTE — Progress Notes (Signed)
OT Cancellation Note  Patient Details Name: Ricky Conley MRN: [id number] DOB: 03/17/1945   Cancelled Treatment:    Reason Eval/Treat Not Completed: Other (comment)  Pt was with PT when OT checked on him- will check on him later this day or next day.  Kari Baars, OT Acute Rehabilitation Services [phone] Office- [phone], Edwena Felty D 10/30/2017, 12:39 PM

## 2017-10-30 NOTE — Consult Note (Signed)
Story Nurse ostomy consult note Stoma type/location: Pt has ostomy which has been present several years.  He states his wife changes the pouch for him prior to admission. Requested to assist with pouch change and ordering supplies. Stomal assessment/size: Stoma is red and viable, 1 inch, above skin level. Peristomal assessment: Intact skin surrounding Output: Large amt semiformed stool Ostomy pouching: 1pc.  Education provided:  Applied one piece with barrier ring. Pt was previously wearing a one piece convex Coloplast, which is not available in the [facility] supply formulary.  2 convex pouches ordered to the room for staff nurse use PRN.  No family members present to discuss plan of care. Please re-consult if further assistance is needed.  Thank-you,  Julien Girt MSN, [place], [place], [place], [place]

## 2017-10-30 NOTE — Evaluation (Signed)
Occupational Therapy Evaluation Patient Details Name: Ricky Conley MRN: [id number] DOB: 04/25/1945 Today's Date: 10/30/2017    History of Present Illness 72 y.o. male with medical history significant of Lewy body disease (diagnosed 05/2017) with frequent falls and dementia as well as colorectal cancer with colostomy bag, lumbar spinal stenosis, Ascending aortic aneurysm, DM 2, HTN, HLD who presents with a fall and lower back pain. He was found to have a T7-8 compression fractures.   Clinical Impression   Pt admitted with the above. Pt currently with functional limitations due to the deficits listed below (see OT Problem List).  Pt will benefit from skilled OT to increase their safety and independence with ADL and functional mobility for ADL to facilitate discharge to venue listed below.      Follow Up Recommendations  SNF    Equipment Recommendations  None recommended by OT       Precautions / Restrictions Precautions Precautions: Back Required Braces or Orthoses: Spinal Brace Spinal Brace: Thoracolumbosacral orthotic(remained in place)      Mobility Bed Mobility Overal bed mobility: Needs Assistance Bed Mobility: Rolling;Sidelying to Sit Rolling: Min assist Sidelying to sit: Mod assist       General bed mobility comments: pt in chair  Transfers Overall transfer level: Needs assistance Equipment used: Rolling walker (2 wheeled) Transfers: Sit to/from Stand Sit to Stand: Mod assist Stand pivot transfers: Mod assist       General transfer comment: VC for hand placement    Balance Overall balance assessment: History of Falls                                         ADL either performed or assessed with clinical judgement   ADL Overall ADL's : Needs assistance/impaired Eating/Feeding: Set up;Sitting   Grooming: Set up;Sitting   Upper Body Bathing: Minimal assistance;Sitting   Lower Body Bathing: Maximal assistance;Sit to/from stand;Cueing for  sequencing;Cueing for safety;Cueing for back precautions;+2 for safety/equipment   Upper Body Dressing : Minimal assistance;Maximal assistance;Sitting;Standing Upper Body Dressing Details (indicate cue type and reason): max A brace Lower Body Dressing: Maximal assistance;Sit to/from stand;Cueing for sequencing;Cueing for safety;+2 for physical assistance                       Vision Patient Visual Report: No change from baseline              Pertinent Vitals/Pain Pain Assessment: Faces Pain Score: 2  Faces Pain Scale: Hurts a little bit Pain Location: around T8 area and R shoulder blade Pain Descriptors / Indicators: Discomfort Pain Intervention(s): Limited activity within patient's tolerance;Repositioned     Hand Dominance     Extremity/Trunk Assessment Upper Extremity Assessment Upper Extremity Assessment: Generalized weakness   Lower Extremity Assessment Lower Extremity Assessment: Generalized weakness       Communication Communication Communication: HOH   Cognition Arousal/Alertness: Awake/alert Behavior During Therapy: WFL for tasks assessed/performed Overall Cognitive Status: Within Functional Limits for tasks assessed                                 General Comments: hx of dementia however appropriate for session, no memory issues per spouse   General Comments               Home Living Family/patient expects to be discharged  to:: Private residence Living Arrangements: Spouse/significant other   Type of Home: House Home Access: [place]: One level     Bathroom Shower/Tub: Tub/shower unit         Burden: Environmental consultant - 2 wheels;Cane - single point;Wheelchair - manual          Prior Functioning/Environment Level of Independence: Needs assistance  Gait / Transfers Assistance Needed: spouse reports pt with increased falls lately, pt requiring assist for balance with standing during ADLs, pt uses  multiple assistive devices at home, ambulates with RW however spouse reports increased falls due to "freezing" of LEs while UEs continue forward with RW              OT Problem List: Decreased strength;Decreased activity tolerance;Decreased safety awareness;Impaired balance (sitting and/or standing);Decreased knowledge of use of DME or AE      OT Treatment/Interventions: Self-care/ADL training;Patient/family education;DME and/or AE instruction    OT Goals(Current goals can be found in the care plan section) Acute Rehab OT Goals Patient Stated Goal: go to rehab OT Goal Formulation: With patient Time For Goal Achievement: 11/06/17 Potential to Achieve Goals: Good ADL Goals Pt Will Perform Grooming: (P) with supervision;standing Pt Will Perform Upper Body Dressing: (P) with supervision;sitting Pt Will Perform Lower Body Dressing: (P) with supervision;sit to/from stand Pt Will Transfer to Toilet: (P) with supervision;bedside commode;stand pivot transfer Pt Will Perform Toileting - Clothing Manipulation and hygiene: (P) with supervision;sit to/from stand  OT Frequency: Min 2X/week   Barriers to D/C:               AM-PAC PT "6 Clicks" Daily Activity     Outcome Measure Help from another person eating meals?: None Help from another person taking care of personal grooming?: A Little Help from another person toileting, which includes using toliet, bedpan, or urinal?: A Lot Help from another person bathing (including washing, rinsing, drying)?: A Lot Help from another person to put on and taking off regular upper body clothing?: A Little Help from another person to put on and taking off regular lower body clothing?: Total 6 Click Score: 15   End of Session Equipment Utilized During Treatment: Rolling walker  Activity Tolerance: Patient tolerated treatment well Patient left: in chair;with call bell/phone within reach;with chair alarm set;with family/visitor present  OT Visit  Diagnosis: Unsteadiness on feet (R26.81);Other abnormalities of gait and mobility (R26.89);Repeated falls (R29.6);Muscle weakness (generalized) (M62.81)                Time: 5916-3846 OT Time Calculation (min): 20 min Charges:  OT General Charges $OT Visit: 1 Visit OT Evaluation $OT Eval Moderate Complexity: 1 Mod  Kari Baars, OT Acute Rehabilitation Services [phone] Office- [phone], Edwena Felty D 10/30/2017, 1:36 PM

## 2017-10-30 NOTE — Progress Notes (Addendum)
Patient and spouse agreeable to [facility] of [facility]. Insurance authorization still pending.   Kathrin Greathouse, Marlinda Mike, MSW Clinical Social Worker  [phone] 10/30/2017  2:08 PM

## 2017-10-30 NOTE — Progress Notes (Signed)
PROGRESS NOTE    Ricky Conley   UUV:[id number]  DOB: [birth year]  DOA: 10/27/2017 PCP: Shirline Frees, MD   Brief Narrative:  Ricky Conley is a 72 y.o.malewith medical history significant of Lewy body disease with frequent falls and dementia as well as colorectal cancer with colostomy bag,lumbar spinal stenosis,Ascending aortic aneurysm, DM 2, HTN, HLD who presents with a fall and lower back pain. He was found to have a T7-8 compression fractures.   Subjective: No complaints other than his bottom being sore which his wife states is a chronic issue.   Assessment & Plan:   Principal Problem:   T8 vertebral fracture  - due to fall - MRI   >>  "Chance like thoracic spine fracture involving T8 spinous process, extending anteriorly through the T7-8 disc space, and only minor edema of the inferior T7 and superior T8 endplates reflecting the small fractures immediately adjacent to the interspace.Marland KitchenMarland KitchenMarland KitchenPrevertebral hematoma in the posterior mediastinum opposite the T7-8 disc space. 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 3 times daily   Ascending aortic aneurysm ([place]) -Will need follow-up as an outpatient   Hyperlipidemia -stable continue home medications with atorvastatin 20 g p.o. nightly  Constipation - cont Miralax- he had a small BM today-  DM 2 - -Order Sensitive SSI   - HgA1C 8.2    Hypertension -Continue amlodipine  GERD -Continue with pantoprazole 40 g p.o. Daily  Abnormal thyroid function studies -TSH was 17.551 -Free T4  normal at 0.91 - may be Sick euthyroid syndrome -Repeat studies and 4 to 6 weeks and if necessary start a medication follow-up with endocrinology   DVT prophylaxis: SCDs Code Status: Full code Family Communication: wife Disposition Plan: will go to SNF Consultants:   neurosurgery Procedures:    Antimicrobials:  Anti-infectives (From admission, onward)   None     Objective: Vitals:   10/29/17 0420 10/29/17 1328 10/29/17 2121 10/30/17 0609  BP: (!) 158/79 (!) 157/89 (!) 160/98 (!) 142/92  Pulse: 86 100 (!) 102 89  Resp:  16 17 18   Temp: 98.1 F (36.7 C) 98.1 F (36.7 C) 98.1 F (36.7 C) 98.3 F (36.8 C)  TempSrc: Oral Oral Oral Oral  SpO2: 96% 95% 95% 93%  Weight:      Height:        Intake/Output Summary (Last 24 hours) at 10/30/2017 0757 Last data filed at 10/30/2017 0600 Gross per 24 hour  Intake 1320 ml  Output 2000 ml  Net -680 ml   Filed Weights   10/27/17 1224 10/28/17 1100  Weight: 82.1 kg 81.6 kg    Examination: General exam: Appears comfortable - very hard of hearing HEENT: PERRLA, oral mucosa moist, no sclera icterus or thrush Respiratory system: Clear to auscultation. Respiratory effort normal. Cardiovascular system: S1 & S2 heard, RRR.   Gastrointestinal system: Abdomen soft, non-tender, nondistended. Normal bowel sound. Colostomy noted. Central nervous system: Alert and oriented. No focal neurological deficits. Extremities: No cyanosis, clubbing or edema Skin: No rashes or ulcers Psychiatry:  Mood & affect appropriate.     Data Reviewed:  I have personally reviewed following labs and imaging studies  CBC: Recent Labs  Lab 10/27/17 1424 10/28/17 0507 10/29/17 0447  WBC 7.1 6.2 5.4  NEUTROABS 4.1  --  2.6  HGB 14.1 13.6 13.4  HCT 43.6 42.3 41.3  MCV 92.8 92.6 91.8  PLT 161 177 212   Basic Metabolic Panel: Recent Labs  Lab 10/27/17 1424 10/28/17 0507 10/29/17 0447  NA 141 141 140  K 4.7 5.1 4.5  CL 101 100 99  CO2 30 33* 31    GLUCOSE 150* 171* 130*  BUN 24* 28* 22  CREATININE 0.95 1.04 0.86  CALCIUM 9.3 9.4 8.8*  MG  --  2.0 2.1  PHOS  --  3.9 3.4   GFR: Estimated Creatinine Clearance: 77.8 mL/min (by C-G formula based on SCr of 0.86 mg/dL). Liver Function Tests: Recent Labs  Lab 10/27/17 1424 10/28/17 0507 10/29/17 0447  AST 21 24 26   ALT 20 20 21   ALKPHOS 125 116 116  BILITOT 1.2 1.0 1.0  PROT 7.2 7.0 6.9  ALBUMIN 3.5 3.4* 3.1*   Recent Labs  Lab 10/27/17 1424  LIPASE 31   No results for input(s): AMMONIA in the last 168 hours. Coagulation Profile: No results for input(s): INR, PROTIME in the last 168 hours. Cardiac Enzymes: No results for input(s): CKTOTAL, CKMB, CKMBINDEX, TROPONINI in the last 168 hours. BNP (last 3 results) No results for input(s): PROBNP in the last 8760 hours. HbA1C: Recent Labs    10/28/17 0507  HGBA1C 8.2*   CBG: Recent Labs  Lab 10/29/17 0745 10/29/17 1319 10/29/17 1723 10/29/17 2122 10/30/17 0714  GLUCAP 144* 281* 159* 207* 133*   Lipid Profile: No results for input(s): CHOL, HDL, LDLCALC, TRIG, CHOLHDL, LDLDIRECT in the last 72 hours. Thyroid Function Tests: Recent Labs    10/28/17 0507  TSH 17.551*  FREET4 0.91   Anemia Panel: No results for input(s): VITAMINB12, FOLATE, FERRITIN, TIBC, IRON, RETICCTPCT in the last 72 hours. Urine analysis:    Component Value Date/Time   COLORURINE YELLOW 10/27/2017 1424   APPEARANCEUR CLEAR 10/27/2017 1424   LABSPEC 1.029 10/27/2017 1424   PHURINE 5.0 10/27/2017 1424   GLUCOSEU 50 (A) 10/27/2017 1424   HGBUR NEGATIVE 10/27/2017 1424   BILIRUBINUR NEGATIVE 10/27/2017 1424   KETONESUR 5 (A) 10/27/2017 1424   PROTEINUR NEGATIVE 10/27/2017 1424   UROBILINOGEN 2.0 (H) 02/18/2013 1611   NITRITE NEGATIVE 10/27/2017 1424   LEUKOCYTESUR NEGATIVE 10/27/2017 1424   Sepsis Labs: @LABRCNTIP (procalcitonin:4,lacticidven:4) )No results found for this or any previous visit (from the past 240 hour(s)).        Radiology Studies: Dg Thoracic Spine 4v  Result Date: 10/28/2017 CLINICAL DATA:  T8 fracture. EXAM: THORACIC SPINE - 4+ VIEW COMPARISON:  Thoracic spine CT and MRI 10/27/2017 FINDINGS: AP and lateral radiographs in both the supine and standing positions are provided. There is no listhesis. As shown on the earlier CT and MRI, there is a fracture through the T7-8 disc space level with involvement of the T7 inferior and T8 superior endplates. The T8 spinous process fracture is much better demonstrated on CT than on these radiographs. There is no vertebral collapse, including with standing. Diffuse bridging vertebral osteophytes are noted throughout the thoracic spine. The visualized portions of the lungs are grossly clear. IMPRESSION: Chance like fracture involving the T7-8 disc space and T8 spinous process as on prior CT. No evidence of vertebral collapse or subluxation with standing. Electronically Signed   By: Seymour Bars.D.  On: 10/28/2017 20:19      Scheduled Meds: . amLODipine  5 mg Oral Daily  . atorvastatin  20 mg Oral Daily  . buPROPion  300 mg Oral Daily  . clonazePAM  0.5 mg Oral BID  . divalproex  500 mg Oral TID  . gabapentin  300 mg Oral TID  . insulin aspart  0-9 Units Subcutaneous TID WC  . memantine  10 mg Oral BID  . pantoprazole  40 mg Oral Daily  . polyethylene glycol  17 g Oral BID   Continuous Infusions:   LOS: 1 day    Time spent in minutes: 35    Debbe Odea, MD [facility] Pager: [email or web address] Password [facility] 10/30/2017, 7:57 AM

## 2017-10-30 NOTE — Progress Notes (Signed)
Inpatient Diabetes Program Recommendations  AACE/ADA: New Consensus Statement on Inpatient Glycemic Control (2015)  Target Ranges:  Prepandial:   less than 140 mg/dL      Peak postprandial:   less than 180 mg/dL (1-2 hours)      Critically ill patients:  140 - 180 mg/dL   Results for Ricky Conley, Ricky Conley (MRN [id number]) as of 10/30/2017 15:20  Ref. Range 10/29/2017 07:45 10/29/2017 13:19 10/29/2017 17:23 10/29/2017 21:22  Glucose-Capillary Latest Ref Range: 70 - 99 mg/dL 144 (H) 281 (H) 159 (H) 207 (H)   Results for Ricky Conley, Ricky Conley (MRN [id number]) as of 10/30/2017 15:20  Ref. Range 10/30/2017 07:14 10/30/2017 11:29  Glucose-Capillary Latest Ref Range: 70 - 99 mg/dL 133 (H) 237 (H)    Home DM Meds: Metformin 500 mg Daily  Current Orders: Novolog Sensitive Correction Scale/ SSI (0-9 units) TID AC       Patient eating 100% of meals.  Having elevated postprandial CBGs.      MD- Please consider starting Novolog Meal Coverage for this patient while home Metformin is on hold:  Novolog 3 units TID with meals  (Please add the following Hold Parameters: Hold if pt eats <50% of meal, Hold if pt NPO)     --Will follow patient during hospitalization--  Wyn Quaker RN, MSN, CDE Diabetes Coordinator Inpatient Glycemic Control Team Team Pager: [phone] (8a-5p)

## 2017-10-31 DIAGNOSIS — E1165 Type 2 diabetes mellitus with hyperglycemia: Secondary | ICD-10-CM

## 2017-10-31 DIAGNOSIS — F418 Other specified anxiety disorders: Secondary | ICD-10-CM | POA: Diagnosis not present

## 2017-10-31 DIAGNOSIS — S22068A Other fracture of T7-T8 thoracic vertebra, initial encounter for closed fracture: Secondary | ICD-10-CM | POA: Diagnosis not present

## 2017-10-31 DIAGNOSIS — R531 Weakness: Secondary | ICD-10-CM | POA: Diagnosis not present

## 2017-10-31 DIAGNOSIS — G3183 Dementia with Lewy bodies: Secondary | ICD-10-CM | POA: Diagnosis not present

## 2017-10-31 DIAGNOSIS — IMO0002 Reserved for concepts with insufficient information to code with codable children: Secondary | ICD-10-CM

## 2017-10-31 DIAGNOSIS — R279 Unspecified lack of coordination: Secondary | ICD-10-CM | POA: Diagnosis not present

## 2017-10-31 DIAGNOSIS — F3170 Bipolar disorder, currently in remission, most recent episode unspecified: Secondary | ICD-10-CM | POA: Diagnosis not present

## 2017-10-31 DIAGNOSIS — E0781 Sick-euthyroid syndrome: Secondary | ICD-10-CM | POA: Diagnosis not present

## 2017-10-31 DIAGNOSIS — G9009 Other idiopathic peripheral autonomic neuropathy: Secondary | ICD-10-CM | POA: Diagnosis not present

## 2017-10-31 DIAGNOSIS — K5900 Constipation, unspecified: Secondary | ICD-10-CM | POA: Diagnosis not present

## 2017-10-31 DIAGNOSIS — Z933 Colostomy status: Secondary | ICD-10-CM | POA: Diagnosis not present

## 2017-10-31 DIAGNOSIS — I10 Essential (primary) hypertension: Secondary | ICD-10-CM | POA: Diagnosis not present

## 2017-10-31 DIAGNOSIS — S22069D Unspecified fracture of T7-T8 vertebra, subsequent encounter for fracture with routine healing: Secondary | ICD-10-CM | POA: Diagnosis not present

## 2017-10-31 DIAGNOSIS — G4701 Insomnia due to medical condition: Secondary | ICD-10-CM | POA: Diagnosis not present

## 2017-10-31 DIAGNOSIS — E870 Hyperosmolality and hypernatremia: Secondary | ICD-10-CM

## 2017-10-31 DIAGNOSIS — R2689 Other abnormalities of gait and mobility: Secondary | ICD-10-CM | POA: Diagnosis not present

## 2017-10-31 DIAGNOSIS — F0280 Dementia in other diseases classified elsewhere without behavioral disturbance: Secondary | ICD-10-CM | POA: Diagnosis not present

## 2017-10-31 DIAGNOSIS — Z433 Encounter for attention to colostomy: Secondary | ICD-10-CM | POA: Diagnosis not present

## 2017-10-31 DIAGNOSIS — Z743 Need for continuous supervision: Secondary | ICD-10-CM | POA: Diagnosis not present

## 2017-10-31 DIAGNOSIS — F0390 Unspecified dementia without behavioral disturbance: Secondary | ICD-10-CM | POA: Diagnosis not present

## 2017-10-31 DIAGNOSIS — M4310 Spondylolisthesis, site unspecified: Secondary | ICD-10-CM | POA: Diagnosis not present

## 2017-10-31 DIAGNOSIS — E785 Hyperlipidemia, unspecified: Secondary | ICD-10-CM | POA: Diagnosis not present

## 2017-10-31 DIAGNOSIS — R269 Unspecified abnormalities of gait and mobility: Secondary | ICD-10-CM | POA: Diagnosis not present

## 2017-10-31 DIAGNOSIS — K210 Gastro-esophageal reflux disease with esophagitis: Secondary | ICD-10-CM | POA: Diagnosis not present

## 2017-10-31 DIAGNOSIS — M6281 Muscle weakness (generalized): Secondary | ICD-10-CM | POA: Diagnosis not present

## 2017-10-31 DIAGNOSIS — I714 Abdominal aortic aneurysm, without rupture: Secondary | ICD-10-CM | POA: Diagnosis not present

## 2017-10-31 DIAGNOSIS — S22069A Unspecified fracture of T7-T8 vertebra, initial encounter for closed fracture: Secondary | ICD-10-CM | POA: Diagnosis not present

## 2017-10-31 DIAGNOSIS — R296 Repeated falls: Secondary | ICD-10-CM

## 2017-10-31 DIAGNOSIS — M48062 Spinal stenosis, lumbar region with neurogenic claudication: Secondary | ICD-10-CM | POA: Diagnosis not present

## 2017-10-31 DIAGNOSIS — F0391 Unspecified dementia with behavioral disturbance: Secondary | ICD-10-CM | POA: Diagnosis not present

## 2017-10-31 DIAGNOSIS — F319 Bipolar disorder, unspecified: Secondary | ICD-10-CM | POA: Diagnosis not present

## 2017-10-31 LAB — GLUCOSE, CAPILLARY
Glucose-Capillary: 128 mg/dL — ABNORMAL HIGH (ref 70–99)
Glucose-Capillary: 242 mg/dL — ABNORMAL HIGH (ref 70–99)

## 2017-10-31 MED ORDER — POLYETHYLENE GLYCOL 3350 17 G PO PACK: 17.0000 g | PACK | Freq: Two times a day (BID) | ORAL | 0 refills | Status: AC

## 2017-10-31 MED ORDER — MAGNESIUM CITRATE PO SOLN: 1.0000 | Freq: Every day | ORAL | Status: AC | PRN

## 2017-10-31 MED ORDER — NAPHAZOLINE-GLYCERIN 0.012-0.2 % OP SOLN: 1.0000 [drp] | Freq: Four times a day (QID) | OPHTHALMIC | 0 refills | Status: AC | PRN

## 2017-10-31 MED ORDER — METFORMIN HCL 500 MG PO TABS
500.0000 mg | ORAL_TABLET | Freq: Every day | ORAL | Status: DC
  Administered 2017-10-31: 500 mg via ORAL
  Filled 2017-10-31: qty 1

## 2017-10-31 MED ORDER — METHOCARBAMOL 500 MG PO TABS: 500.0000 mg | ORAL_TABLET | Freq: Four times a day (QID) | ORAL | Status: AC | PRN

## 2017-10-31 MED ORDER — HYDROCODONE-ACETAMINOPHEN 5-325 MG PO TABS: 1.0000 | ORAL_TABLET | ORAL | 0 refills | Status: AC | PRN

## 2017-10-31 MED ORDER — LINAGLIPTIN 5 MG PO TABS
5.0000 mg | ORAL_TABLET | Freq: Every day | ORAL | Status: DC
  Administered 2017-10-31: 5 mg via ORAL
  Filled 2017-10-31: qty 1

## 2017-10-31 MED ORDER — ACETAMINOPHEN 325 MG PO TABS: 650.0000 mg | ORAL_TABLET | Freq: Four times a day (QID) | ORAL | Status: AC | PRN

## 2017-10-31 MED ORDER — LINAGLIPTIN 5 MG PO TABS: 5.0000 mg | ORAL_TABLET | Freq: Every day | ORAL | Status: DC

## 2017-10-31 MED ORDER — ASPIRIN EC 81 MG PO TBEC
81.0000 mg | DELAYED_RELEASE_TABLET | ORAL | Status: DC
  Administered 2017-10-31: 81 mg via ORAL
  Filled 2017-10-31: qty 1

## 2017-10-31 NOTE — Progress Notes (Signed)
Ship broker received [phone].  Patient can d/c to SNF today.  [facility] to transport.

## 2017-10-31 NOTE — Discharge Summary (Addendum)
Physician Discharge Summary  Ricky Conley CHE:[id number] DOB: 1945/09/21 DOA: 10/27/2017  PCP: Shirline Frees, MD  Admit date: 10/27/2017 Discharge date: 10/31/2017  Admitted From: home  Disposition:  DNF   Recommendations for Outpatient Follow-up:  1. F/u on TFTs in 1-2 months 2. F/u with Dr Saintclair Halsted in 6-8 weeks  Discharge Condition:  stable   CODE STATUS:  Full code   Consultations:  neurosurgery   Discharge Diagnoses:  Principal Problem:   T8 vertebral fracture ([place]) Active Problems:   Hypernatremia   Frequent falls   DM (diabetes mellitus), type 2, uncontrolled (HCC)   Ascending aortic aneurysm (H[facility])   Bipolar disorder ([place])   Colostomy in place ([place])   Hyperlipidemia   Weakness generalized   Dementia with behavioral disturbance   Constipation   Brief Summary: Ricky Conley is a 72 y.o.malewith medical history significant of Lewy body disease with frequent falls and dementia as well as colorectal cancer with colostomy bag,lumbar spinal stenosis,Ascending aortic aneurysm, DM 2, HTN, HLD who presents with a fall and lower back pain. He was found to have a T7-8 compression fractures.  Hospital Course:  Principal Problem:   T8 vertebral fracture  - due to fall - MRI   >>  "Chance like thoracic spine fracture involving T8 spinous process, extending anteriorly through the T7-8 disc space, and only minor edema of the inferior T7 and superior T8 endplates reflecting the small fractures immediately adjacent to the interspace.Marland KitchenMarland KitchenMarland KitchenPrevertebral hematoma in the posterior mediastinum opposite the T7-8 disc space. 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, with mild cord flattening."  - Neurosurgery, Dr Saintclair Halsted, assisting with management and recommended to continue brace & pain control - PT has been ordered and he will need SNF which he is in agreement with  Active Problems:   DM 2 uncontrolled -Ordered Sensitive SSI while in the hospital  - HgA1C 8.2- sugars have  been running in the 200s and therefore I have added Tradjenta in addition to the Metformin that he already takes  Lewy Body disease  - with frequent falls-    Lewy body Dementia with behavioral disturbance  Bipolar disorder - continue home medications> Memantine, Bupropion , Clonazepam, Depakote 500 mg p.o. 3 times daily - no sundowning noted in the hospital  Ascending aortic aneurysm  -Will need follow-up as an outpatient  Hyperlipidemia -stable continue home medicationswith atorvastatin 20 g p.o. nightly  Constipation - cont Miralax- other PRN laxative ordered as noted below  Hypertension -Continue amlodipine  GERD -Continue with pantoprazole 40 g p.o. Daily  Abnormal thyroid function studies -TSH was17.551 -Free T4 normal at 0.91 - abnormal results may be due to sick euthyroid syndrome -Repeat studies and 4 to 6 weeks and if necessary start a medication follow-up with endocrinology    Discharge Exam: Vitals:   10/31/17 0607 10/31/17 0959  BP: (!) 170/94 (!) 162/92  Pulse: (!) 102   Resp: 17   Temp: 98.3 F (36.8 C)   SpO2: 95%    Vitals:   10/30/17 1254 10/30/17 2238 10/31/17 0607 10/31/17 0959  BP: (!) 146/95 (!) 162/95 (!) 170/94 (!) 162/92  Pulse: (!) 112 (!) 104 (!) 102   Resp: 14 17 17    Temp: 97.8 F (36.6 C) 98.2 F (36.8 C) 98.3 F (36.8 C)   TempSrc: Oral Oral Oral   SpO2: 95% 95% 95%   Weight:      Height:        General: Pt is alert, awake, not in  acute distress- wearing back brace Cardiovascular: RRR, S1/S2 +, no rubs, no gallops Respiratory: CTA bilaterally, no wheezing, no rhonchi Abdominal: Soft, NT, ND, bowel sounds + Extremities: no edema, no cyanosis   Discharge Instructions  Discharge Instructions    Diet - low sodium heart healthy   Complete by:  As directed    Diet Carb Modified   Complete by:  As directed    Increase activity slowly   Complete by:  As directed      Allergies as of 10/31/2017   No  Known Allergies     Medication List    TAKE these medications   acetaminophen 325 MG tablet Commonly known as:  TYLENOL Take 2 tablets (650 mg total) by mouth every 6 (six) hours as needed for mild pain (or Fever >/= 101). What changed:    medication strength  how much to take  when to take this  reasons to take this   amLODipine 5 MG tablet Commonly known as:  NORVASC Take 1 tablet (5 mg total) by mouth daily.   aspirin 81 MG EC tablet Take 81 mg by mouth every morning.   atorvastatin 20 MG tablet Commonly known as:  LIPITOR Take 1 tablet by mouth daily.   buPROPion 300 MG 24 hr tablet Commonly known as:  WELLBUTRIN XL Take 300 mg by mouth every morning.   cholecalciferol 1000 units tablet Commonly known as:  VITAMIN D Take 1,000 Units by mouth daily.   clonazePAM 0.5 MG tablet Commonly known as:  KLONOPIN Take 0.5 mg by mouth. One in the morning, two at night   divalproex 500 MG DR tablet Commonly known as:  DEPAKOTE Take 500 mg by mouth 3 (three) times daily.   gabapentin 300 MG capsule Commonly known as:  NEURONTIN Take 300 mg by mouth 3 (three) times daily.   HYDROcodone-acetaminophen 5-325 MG tablet Commonly known as:  NORCO/VICODIN Take 1-2 tablets by mouth every 4 (four) hours as needed for moderate pain.   magnesium citrate Soln Take 296 mLs (1 Bottle total) by mouth daily as needed for severe constipation.   memantine 10 MG tablet Commonly known as:  NAMENDA Take 10 mg by mouth 2 (two) times daily.   metFORMIN 500 MG tablet Commonly known as:  GLUCOPHAGE Take 500 mg by mouth daily.   methocarbamol 500 MG tablet Commonly known as:  ROBAXIN Take 1 tablet (500 mg total) by mouth every 6 (six) hours as needed for muscle spasms.   naphazoline-glycerin 0.012-0.2 % Soln Commonly known as:  CLEAR EYES REDNESS Place 1-2 drops into both eyes 4 (four) times daily as needed for eye irritation.   omeprazole 20 MG capsule Commonly known as:   PRILOSEC TAKE 1 CAPSULE BY MOUTH ONCE DAILY   polyethylene glycol packet Commonly known as:  MIRALAX / GLYCOLAX Take 17 g by mouth 2 (two) times daily.   trolamine salicylate 10 % cream Commonly known as:  ASPERCREME Apply 1 application topically 3 (three) times daily as needed for muscle pain.       No Known Allergies   Procedures/Studies:   Dg Ribs Unilateral W/chest Left  Result Date: 10/23/2017 CLINICAL DATA:  [place] backwards today from a standing position, severe low back pain since, diabetes mellitus, former surgery, colon cancer EXAM: LEFT RIBS AND CHEST - 3+ VIEW COMPARISON:  Chest radiographs 06/13/2016 FINDINGS: Normal heart size, mediastinal contours, and pulmonary vascularity. Lungs clear. No pulmonary infiltrate, pleural effusion or pneumothorax. Osseous mineralization normal. Scattered endplate spur formation  thoracic spine. No definite rib fracture or bone destruction. IMPRESSION: No acute abnormalities. Electronically Signed   By: Lavonia Dana M.D.   On: 10/23/2017 19:59   Dg Thoracic Spine 4v  Result Date: 10/28/2017 CLINICAL DATA:  T8 fracture. EXAM: THORACIC SPINE - 4+ VIEW COMPARISON:  Thoracic spine CT and MRI 10/27/2017 FINDINGS: AP and lateral radiographs in both the supine and standing positions are provided. There is no listhesis. As shown on the earlier CT and MRI, there is a fracture through the T7-8 disc space level with involvement of the T7 inferior and T8 superior endplates. The T8 spinous process fracture is much better demonstrated on CT than on these radiographs. There is no vertebral collapse, including with standing. Diffuse bridging vertebral osteophytes are noted throughout the thoracic spine. The visualized portions of the lungs are grossly clear. IMPRESSION: Chance like fracture involving the T7-8 disc space and T8 spinous process as on prior CT. No evidence of vertebral collapse or subluxation with standing. Electronically Signed   By: Logan Bores M.D.    On: 10/28/2017 20:19   Dg Lumbar Spine Complete  Result Date: 10/23/2017 CLINICAL DATA:  [place] backwards today from a standing position, severe low back pain since, diabetes mellitus, former surgery, colon cancer EXAM: LUMBAR SPINE - COMPLETE 4+ VIEW COMPARISON:  CT abdomen and pelvis 03/22/2016 FINDINGS: Five non-rib-bearing lumbar vertebra. Partially lumbarized S1 segment of sacrum. Bones appear demineralized. Multilevel degenerative disc and facet disease changes of the lumbar spine. Vertebral body heights maintained. Minimal anterolisthesis at L4-L5 and retrolisthesis at L5-S1 unchanged. SI joints symmetric. Atherosclerotic calcifications aorta. IMPRESSION: Multilevel degenerative disc and facet disease changes of the lumbar spine. No acute abnormalities. Electronically Signed   By: Lavonia Dana M.D.   On: 10/23/2017 20:02   Ct Angio Chest Pe W/cm &/or Wo Cm  Result Date: 10/27/2017 CLINICAL DATA:  Chest pain following fall several days ago, initial encounter EXAM: CT ANGIOGRAPHY CHEST CT ABDOMEN AND PELVIS WITH CONTRAST TECHNIQUE: Multidetector CT imaging of the chest was performed using the standard protocol during bolus administration of intravenous contrast. Multiplanar CT image reconstructions and MIPs were obtained to evaluate the vascular anatomy. Multidetector CT imaging of the abdomen and pelvis was performed using the standard protocol during bolus administration of intravenous contrast. CONTRAST:  166mL ISOVUE-370 IOPAMIDOL (ISOVUE-370) INJECTION 76% COMPARISON:  03/22/2016 CT of the abdomen FINDINGS: CTA CHEST FINDINGS Cardiovascular: Mild dilatation of the ascending aorta 4.1 cm is noted. Normal tapering in the aortic arch is seen. Atherosclerotic calcifications are noted. No findings to suggest dissection are seen. No cardiac enlargement is noted. Diffuse coronary calcifications are seen. The pulmonary artery demonstrates a normal branching pattern without intraluminal filling defect to  suggest pulmonary embolism. Mediastinum/Nodes: Thoracic inlet is within normal limits. No sizable hilar or mediastinal adenopathy is noted. The esophagus as visualized is within normal limits. Lungs/Pleura: Lungs are well aerated bilaterally. No focal infiltrate or sizable effusion is seen. No sizable parenchymal nodules are noted. Musculoskeletal: Degenerative changes of the thoracic spine are seen. No acute rib fracture or compression deformity is seen. There is a fracture through the spinous process of T8 which is better visualized on the CT of the thoracic spine. Review of the MIP images confirms the above findings. CT ABDOMEN and PELVIS FINDINGS Hepatobiliary: Diffuse fatty infiltration of the liver is noted. The gallbladder is well distended and demonstrates a single focus of non dependent air. This is likely related to prior instrumentation and sphincterotomy and has decreased significantly in  the interval from the prior CT examination. Pancreas: Unremarkable. No pancreatic ductal dilatation or surrounding inflammatory changes. Spleen: Normal in size without focal abnormality. Adrenals/Urinary Tract: Adrenal glands are within normal limits. Kidneys demonstrate a normal enhancement pattern bilaterally. Small renal cyst is noted in the lower pole of the right kidney. Bladder is partially distended. Stomach/Bowel: There are changes consistent with left mid abdominal ostomy stable from the prior exam. No obstructive or inflammatory changes of the bowel are noted. The appendix is within normal limits. Vascular/Lymphatic: Aortic atherosclerosis. No enlarged abdominal or pelvic lymph nodes. Reproductive: Prostate is unremarkable. Other: Post treatment changes are noted anterior to the sacrum and stable in appearance from the prior exam. No ascites is noted. No free air is seen. Musculoskeletal: Degenerative changes of the lumbar spine are noted. Review of the MIP images confirms the above findings. IMPRESSION: CTA  of the chest: No evidence of pulmonary emboli. T8 spinous process fracture better visualized on CT of the thoracic spine. Dilatation of the ascending aorta to 4.1 cm. Recommend annual imaging followup by CTA or MRA. This recommendation follows 2010 ACCF/AHA/AATS/ACR/ASA/SCA/SCAI/SIR/STS/SVM Guidelines for the Diagnosis and Management of Patients with Thoracic Aortic Disease. Circulation. 2010; 121: U202-R427 CT of the abdomen and pelvis: Chronic changes similar to that seen on prior exam. No acute abnormality is noted. Electronically Signed   By: Inez Catalina M.D.   On: 10/27/2017 17:27   Ct Thoracic Spine Wo Contrast  Result Date: 10/27/2017 CLINICAL DATA:  Dementia patient status post fall with persistent back pain EXAM: CT THORACIC AND LUMBAR SPINE WITHOUT CONTRAST TECHNIQUE: Multidetector CT imaging of the thoracic and lumbar spine was performed without contrast. Multiplanar CT image reconstructions were also generated. COMPARISON:  Radiography 10/23/2017 FINDINGS: CT THORACIC SPINE FINDINGS Alignment: Normal Vertebrae: Solid bridging osteophytes throughout the entire thoracic region extending to L1. Nondisplaced fracture of the spinous process of T8. Fracture extends horizontally through the T7-8 disc level. No displacement. No canal compromise. Paraspinal and other soft tissues: Negative Disc levels: No stenosis of the canal or foramina. Small dural calcification posteriorly at T10. Not likely significant. This was present in 2017. CT LUMBAR SPINE FINDINGS Segmentation: 5 lumbar type vertebral bodies. Alignment: Normal except for 2 mm anterolisthesis L4-5. Vertebrae: No lumbosacral fracture in the region studied. Paraspinal and other soft tissues: Negative Disc levels: Moderate multifactorial stenosis at L2-3, L3-4. At L4-5, there is severe multifactorial stenosis. At L5-S1 there is stenosis of the lateral recesses and foramina. Ankylosis of the sacroiliac joints. IMPRESSION: CT THORACIC SPINE IMPRESSION  Fracture of the spinous process of T8, nondisplaced. Fracture extends transversely through the T7-8 disc level, Chance fracture equivalent. No displacement. No canal compromise. No unstable fracture seen. 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 CT LUMBAR SPINE IMPRESSION No lumbar spine fracture. 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 Bilateral lateral recess and foraminal stenosis at L5-S1. Mild to moderate canal stenosis at L2-3 and L3-4. Electronically Signed   By: Nelson Chimes M.D.   On: 10/27/2017 17:17   Ct Lumbar Spine Wo Contrast  Result Date: 10/27/2017 CLINICAL DATA:  Dementia patient status post fall with persistent back pain EXAM: CT THORACIC AND LUMBAR SPINE WITHOUT CONTRAST TECHNIQUE: Multidetector CT imaging of the thoracic and lumbar spine was performed without contrast. Multiplanar CT image reconstructions were also generated. COMPARISON:  Radiography 10/23/2017 FINDINGS: CT THORACIC SPINE FINDINGS Alignment: Normal Vertebrae: Solid bridging osteophytes throughout the entire thoracic region extending to L1. Nondisplaced fracture of the spinous process of T8. Fracture extends horizontally through  the T7-8 disc level. No displacement. No canal compromise. Paraspinal and other soft tissues: Negative Disc levels: No stenosis of the canal or foramina. Small dural calcification posteriorly at T10. Not likely significant. This was present in 2017. CT LUMBAR SPINE FINDINGS Segmentation: 5 lumbar type vertebral bodies. Alignment: Normal except for 2 mm anterolisthesis L4-5. Vertebrae: No lumbosacral fracture in the region studied. Paraspinal and other soft tissues: Negative Disc levels: Moderate multifactorial stenosis at L2-3, L3-4. At L4-5, there is severe multifactorial stenosis. At L5-S1 there is stenosis of the lateral recesses and foramina. Ankylosis of the sacroiliac joints. IMPRESSION: CT THORACIC SPINE IMPRESSION Fracture of the spinous process of T8, nondisplaced. Fracture extends  transversely through the T7-8 disc level, Chance fracture equivalent. No displacement. No canal compromise. No unstable fracture seen. 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 CT LUMBAR SPINE IMPRESSION No lumbar spine fracture. 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 Bilateral lateral recess and foraminal stenosis at L5-S1. Mild to moderate canal stenosis at L2-3 and L3-4. Electronically Signed   By: Nelson Chimes M.D.   On: 10/27/2017 17:17   Mr Thoracic Spine Wo Contrast  Result Date: 10/27/2017 CLINICAL DATA:  Thoracic spine fracture, continued surveillance EXAM: MRI THORACIC SPINE WITHOUT CONTRAST TECHNIQUE: Multiplanar, multisequence MR imaging of the thoracic spine was performed. No intravenous contrast was administered. COMPARISON:  CT of the thoracic spine 10/27/2017. FINDINGS: Alignment:  Anatomic Vertebrae: T8 spinous process fracture is nondisplaced, barely visible on MR. Minor endplate fractures of T7 inferiorly and T8 superiorly reflect the osseous injury observed on CT. There is no significant wedging. Posterior elements are otherwise intact. No other occult fractures are evident. Cord: No cord injury. 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 This is an incidental finding. Paraspinal and other soft tissues: Prevertebral hematoma can be seen anterior to the T7 and T8 vertebral bodies. There is no intraspinal hematoma. No visible disruption of the posterior longitudinal ligament, or interspinous ligaments. The anterior longitudinal ligament is calcified/ossified, and discontinuous Disc levels: No disc protrusion or spinal stenosis. Functional fusion across the T8-9 and T9-10 levels. IMPRESSION: Chance like thoracic spine fracture involving T8 spinous process, extending anteriorly through the T7-8 disc space, and only minor edema of the inferior T7 and superior T8 endplates reflecting the small fractures immediately adjacent to the interspace. No  significant wedging of the vertebral bodies, or visible injury elsewhere in the thoracic spine. No features to suggest disruption of the posterior longitudinal ligament or posterior interspinous/facet related ligaments at the T7-8 level. There is no vertebral displacement/subluxation. Prevertebral hematoma in the posterior mediastinum opposite the T7-8 disc space. 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 Electronically Signed   By: Staci Righter M.D.   On: 10/27/2017 21:50   Ct Abdomen Pelvis W Contrast  Result Date: 10/27/2017 CLINICAL DATA:  Chest pain following fall several days ago, initial encounter EXAM: CT ANGIOGRAPHY CHEST CT ABDOMEN AND PELVIS WITH CONTRAST TECHNIQUE: Multidetector CT imaging of the chest was performed using the standard protocol during bolus administration of intravenous contrast. Multiplanar CT image reconstructions and MIPs were obtained to evaluate the vascular anatomy. Multidetector CT imaging of the abdomen and pelvis was performed using the standard protocol during bolus administration of intravenous contrast. CONTRAST:  182mL ISOVUE-370 IOPAMIDOL (ISOVUE-370) INJECTION 76% COMPARISON:  03/22/2016 CT of the abdomen FINDINGS: CTA CHEST FINDINGS Cardiovascular: Mild dilatation of the ascending aorta 4.1 cm is noted. Normal tapering in the aortic  arch is seen. Atherosclerotic calcifications are noted. No findings to suggest dissection are seen. No cardiac enlargement is noted. Diffuse coronary calcifications are seen. The pulmonary artery demonstrates a normal branching pattern without intraluminal filling defect to suggest pulmonary embolism. Mediastinum/Nodes: Thoracic inlet is within normal limits. No sizable hilar or mediastinal adenopathy is noted. The esophagus as visualized is within normal limits. Lungs/Pleura: Lungs are well aerated bilaterally. No focal infiltrate or sizable effusion is seen. No sizable parenchymal nodules are  noted. Musculoskeletal: Degenerative changes of the thoracic spine are seen. No acute rib fracture or compression deformity is seen. There is a fracture through the spinous process of T8 which is better visualized on the CT of the thoracic spine. Review of the MIP images confirms the above findings. CT ABDOMEN and PELVIS FINDINGS Hepatobiliary: Diffuse fatty infiltration of the liver is noted. The gallbladder is well distended and demonstrates a single focus of non dependent air. This is likely related to prior instrumentation and sphincterotomy and has decreased significantly in the interval from the prior CT examination. Pancreas: Unremarkable. No pancreatic ductal dilatation or surrounding inflammatory changes. Spleen: Normal in size without focal abnormality. Adrenals/Urinary Tract: Adrenal glands are within normal limits. Kidneys demonstrate a normal enhancement pattern bilaterally. Small renal cyst is noted in the lower pole of the right kidney. Bladder is partially distended. Stomach/Bowel: There are changes consistent with left mid abdominal ostomy stable from the prior exam. No obstructive or inflammatory changes of the bowel are noted. The appendix is within normal limits. Vascular/Lymphatic: Aortic atherosclerosis. No enlarged abdominal or pelvic lymph nodes. Reproductive: Prostate is unremarkable. Other: Post treatment changes are noted anterior to the sacrum and stable in appearance from the prior exam. No ascites is noted. No free air is seen. Musculoskeletal: Degenerative changes of the lumbar spine are noted. Review of the MIP images confirms the above findings. IMPRESSION: CTA of the chest: No evidence of pulmonary emboli. T8 spinous process fracture better visualized on CT of the thoracic spine. Dilatation of the ascending aorta to 4.1 cm. Recommend annual imaging followup by CTA or MRA. This recommendation follows 2010 ACCF/AHA/AATS/ACR/ASA/SCA/SCAI/SIR/STS/SVM Guidelines for the Diagnosis and  Management of Patients with Thoracic Aortic Disease. Circulation. 2010; 121: B638-L373 CT of the abdomen and pelvis: Chronic changes similar to that seen on prior exam. No acute abnormality is noted. Electronically Signed   By: Inez Catalina M.D.   On: 10/27/2017 17:27     The results of significant diagnostics from this hospitalization (including imaging, microbiology, ancillary and laboratory) are listed below for reference.     Microbiology: No results found for this or any previous visit (from the past 240 hour(s)).   Labs: BNP (last 3 results) No results for input(s): BNP in the last 8760 hours. Basic Metabolic Panel: Recent Labs  Lab 10/27/17 1424 10/28/17 0507 10/29/17 0447  NA 141 141 140  K 4.7 5.1 4.5  CL 101 100 99  CO2 30 33* 31  GLUCOSE 150* 171* 130*  BUN 24* 28* 22  CREATININE 0.95 1.04 0.86  CALCIUM 9.3 9.4 8.8*  MG  --  2.0 2.1  PHOS  --  3.9 3.4   Liver Function Tests: Recent Labs  Lab 10/27/17 1424 10/28/17 0507 10/29/17 0447  AST 21 24 26   ALT 20 20 21   ALKPHOS 125 116 116  BILITOT 1.2 1.0 1.0  PROT 7.2 7.0 6.9  ALBUMIN 3.5 3.4* 3.1*   Recent Labs  Lab 10/27/17 1424  LIPASE 31   No results  for input(s): AMMONIA in the last 168 hours. CBC: Recent Labs  Lab 10/27/17 1424 10/28/17 0507 10/29/17 0447  WBC 7.1 6.2 5.4  NEUTROABS 4.1  --  2.6  HGB 14.1 13.6 13.4  HCT 43.6 42.3 41.3  MCV 92.8 92.6 91.8  PLT 161 177 170   Cardiac Enzymes: No results for input(s): CKTOTAL, CKMB, CKMBINDEX, TROPONINI in the last 168 hours. BNP: Invalid input(s): POCBNP CBG: Recent Labs  Lab 10/30/17 0714 10/30/17 1129 10/30/17 1718 10/30/17 2349 10/31/17 0727  GLUCAP 133* 237* 198* 238* 128*   D-Dimer No results for input(s): DDIMER in the last 72 hours. Hgb A1c No results for input(s): HGBA1C in the last 72 hours. Lipid Profile No results for input(s): CHOL, HDL, LDLCALC, TRIG, CHOLHDL, LDLDIRECT in the last 72 hours. Thyroid function  studies No results for input(s): TSH, T4TOTAL, T3FREE, THYROIDAB in the last 72 hours.  Invalid input(s): FREET3 Anemia work up No results for input(s): VITAMINB12, FOLATE, FERRITIN, TIBC, IRON, RETICCTPCT in the last 72 hours. Urinalysis    Component Value Date/Time   COLORURINE YELLOW 10/27/2017 1424   APPEARANCEUR CLEAR 10/27/2017 1424   LABSPEC 1.029 10/27/2017 1424   PHURINE 5.0 10/27/2017 1424   GLUCOSEU 50 (A) 10/27/2017 1424   HGBUR NEGATIVE 10/27/2017 1424   BILIRUBINUR NEGATIVE 10/27/2017 1424   KETONESUR 5 (A) 10/27/2017 1424   PROTEINUR NEGATIVE 10/27/2017 1424   UROBILINOGEN 2.0 (H) 02/18/2013 1611   NITRITE NEGATIVE 10/27/2017 1424   LEUKOCYTESUR NEGATIVE 10/27/2017 1424   Sepsis Labs Invalid input(s): PROCALCITONIN,  WBC,  LACTICIDVEN Microbiology No results found for this or any previous visit (from the past 240 hour(s)).   Time coordinating discharge in minutes: 60  SIGNED:   Debbe Odea, MD  [facility] 10/31/2017, 11:14 AM Pager   If 7PM-7AM, please contact night-coverage [email or web address] Password [email or web address]

## 2017-10-31 NOTE — Clinical Social Work Placement (Addendum)
[facility] arranged for transport.  Spouse informed.  Nurse given the number for report.   CLINICAL SOCIAL WORK PLACEMENT  NOTE  Date:  10/31/2017  Patient Details  Name: Ricky Conley MRN: [id number] Date of Birth: Jun 27, 1945  Clinical Social Work is seeking post-discharge placement for this patient at the [place] level of care (*CSW will initial, date and re-position this form in  chart as items are completed):  Yes   Patient/family provided with [place] Work Department's list of facilities offering this level of care within the geographic area requested by the patient (or if unable, by the patient's family).  Yes   Patient/family informed of their freedom to choose among providers that offer the needed level of care, that participate in Medicare, Medicaid or managed care program needed by the patient, have an available bed and are willing to accept the patient.  Yes   Patient/family informed of [place]'s ownership interest in [facility] and [facility], as well as of the fact that they are under no obligation to receive care at these facilities.  PASRR submitted to EDS on       PASRR number received on       Existing PASRR number confirmed on 10/30/17     FL2 transmitted to all facilities in geographic area requested by pt/family on       FL2 transmitted to all facilities within larger geographic area on 10/29/17     Patient informed that his/her managed care company has contracts with or will negotiate with certain facilities, including the following:        Yes   Patient/family informed of bed offers received.  Patient chooses bed at [facility]     Physician recommends and patient chooses bed at [facility]    Patient to be transferred to [facility] on 10/31/17.  Patient to be transferred to facility by [facility]      Patient family notified on 10/31/17 of transfer.  Name of family member notified:  Spouse      PHYSICIAN Please prepare priority discharge summary, including medications, Please prepare prescriptions     Additional Comment:    _______________________________________________ Lia Hopping, LCSW 10/31/2017, 10:11 AM

## 2017-11-03 DIAGNOSIS — E1165 Type 2 diabetes mellitus with hyperglycemia: Secondary | ICD-10-CM | POA: Diagnosis not present

## 2017-11-03 DIAGNOSIS — E785 Hyperlipidemia, unspecified: Secondary | ICD-10-CM | POA: Diagnosis not present

## 2017-11-03 DIAGNOSIS — I10 Essential (primary) hypertension: Secondary | ICD-10-CM | POA: Diagnosis not present

## 2017-11-03 DIAGNOSIS — S22069D Unspecified fracture of T7-T8 vertebra, subsequent encounter for fracture with routine healing: Secondary | ICD-10-CM | POA: Diagnosis not present

## 2017-11-04 DIAGNOSIS — E1165 Type 2 diabetes mellitus with hyperglycemia: Secondary | ICD-10-CM | POA: Diagnosis not present

## 2017-11-04 DIAGNOSIS — F319 Bipolar disorder, unspecified: Secondary | ICD-10-CM | POA: Diagnosis not present

## 2017-11-04 DIAGNOSIS — S22069D Unspecified fracture of T7-T8 vertebra, subsequent encounter for fracture with routine healing: Secondary | ICD-10-CM | POA: Diagnosis not present

## 2017-11-04 DIAGNOSIS — F0390 Unspecified dementia without behavioral disturbance: Secondary | ICD-10-CM | POA: Diagnosis not present

## 2017-11-06 ENCOUNTER — Other Ambulatory Visit: Payer: Self-pay | Admitting: *Deleted

## 2017-11-06 NOTE — Patient Outreach (Signed)
[facility]) Care Management  11/06/2017  [name] [birth year] [phone]   Onsite visit to [facility]. Epic chart review reveals patient has history of dementia, falls, colorectal cancer with colostomy. Per Epic, patient may become a LTC resident due to change in condition.   No family present in room, patient was sitting in w/c, did not engage during this visit.  Will follow up with SW at facility as she was unavailable during visit.  Will collaborate with [facility] [name], RN, BSN, [place] [phone]) Business Cell  [phone]) Toll Free Office

## 2017-11-10 DIAGNOSIS — S22069D Unspecified fracture of T7-T8 vertebra, subsequent encounter for fracture with routine healing: Secondary | ICD-10-CM | POA: Diagnosis not present

## 2017-11-11 DIAGNOSIS — M4310 Spondylolisthesis, site unspecified: Secondary | ICD-10-CM | POA: Diagnosis not present

## 2017-11-11 DIAGNOSIS — S22068A Other fracture of T7-T8 thoracic vertebra, initial encounter for closed fracture: Secondary | ICD-10-CM | POA: Diagnosis not present

## 2017-11-13 ENCOUNTER — Other Ambulatory Visit: Payer: Self-pay | Admitting: *Deleted

## 2017-11-13 NOTE — Patient Outreach (Signed)
[facility]) Care Management  11/13/2017  Ricky Conley 08-30-1945 [id number]   Collaboration with [facility], confirmed that patient will remain LTC at facility.  No [facility] CM needs at this time.  Will sign off. [name], RN, BSN, [place] [phone]) Business Cell  [phone]) Toll Free Office

## 2017-11-17 DIAGNOSIS — S22069D Unspecified fracture of T7-T8 vertebra, subsequent encounter for fracture with routine healing: Secondary | ICD-10-CM | POA: Diagnosis not present

## 2017-11-24 DIAGNOSIS — R251 Tremor, unspecified: Secondary | ICD-10-CM | POA: Diagnosis not present

## 2017-11-24 DIAGNOSIS — S22069D Unspecified fracture of T7-T8 vertebra, subsequent encounter for fracture with routine healing: Secondary | ICD-10-CM | POA: Diagnosis not present

## 2017-11-25 DIAGNOSIS — R569 Unspecified convulsions: Secondary | ICD-10-CM | POA: Diagnosis not present

## 2017-11-25 DIAGNOSIS — E1142 Type 2 diabetes mellitus with diabetic polyneuropathy: Secondary | ICD-10-CM | POA: Diagnosis not present

## 2017-11-25 DIAGNOSIS — J180 Bronchopneumonia, unspecified organism: Secondary | ICD-10-CM | POA: Diagnosis not present

## 2017-11-25 DIAGNOSIS — G610 Guillain-Barre syndrome: Secondary | ICD-10-CM | POA: Diagnosis not present

## 2017-11-26 DIAGNOSIS — M6281 Muscle weakness (generalized): Secondary | ICD-10-CM | POA: Diagnosis not present

## 2017-11-26 DIAGNOSIS — R269 Unspecified abnormalities of gait and mobility: Secondary | ICD-10-CM | POA: Diagnosis not present

## 2017-11-26 DIAGNOSIS — S22069D Unspecified fracture of T7-T8 vertebra, subsequent encounter for fracture with routine healing: Secondary | ICD-10-CM | POA: Diagnosis not present

## 2017-11-26 DIAGNOSIS — E039 Hypothyroidism, unspecified: Secondary | ICD-10-CM | POA: Diagnosis not present

## 2017-11-26 DIAGNOSIS — Z79899 Other long term (current) drug therapy: Secondary | ICD-10-CM | POA: Diagnosis not present

## 2017-11-28 DIAGNOSIS — E039 Hypothyroidism, unspecified: Secondary | ICD-10-CM | POA: Diagnosis not present

## 2017-11-28 DIAGNOSIS — I10 Essential (primary) hypertension: Secondary | ICD-10-CM | POA: Diagnosis not present

## 2017-11-28 DIAGNOSIS — E785 Hyperlipidemia, unspecified: Secondary | ICD-10-CM | POA: Diagnosis not present

## 2017-12-01 DIAGNOSIS — F418 Other specified anxiety disorders: Secondary | ICD-10-CM | POA: Diagnosis not present

## 2017-12-01 DIAGNOSIS — E1165 Type 2 diabetes mellitus with hyperglycemia: Secondary | ICD-10-CM | POA: Diagnosis not present

## 2017-12-01 DIAGNOSIS — S22069D Unspecified fracture of T7-T8 vertebra, subsequent encounter for fracture with routine healing: Secondary | ICD-10-CM | POA: Diagnosis not present

## 2017-12-01 DIAGNOSIS — G3183 Dementia with Lewy bodies: Secondary | ICD-10-CM | POA: Diagnosis not present

## 2017-12-01 DIAGNOSIS — G4701 Insomnia due to medical condition: Secondary | ICD-10-CM | POA: Diagnosis not present

## 2017-12-01 DIAGNOSIS — R251 Tremor, unspecified: Secondary | ICD-10-CM | POA: Diagnosis not present

## 2017-12-01 DIAGNOSIS — I10 Essential (primary) hypertension: Secondary | ICD-10-CM | POA: Diagnosis not present

## 2017-12-01 DIAGNOSIS — F0280 Dementia in other diseases classified elsewhere without behavioral disturbance: Secondary | ICD-10-CM | POA: Diagnosis not present

## 2017-12-01 DIAGNOSIS — F3170 Bipolar disorder, currently in remission, most recent episode unspecified: Secondary | ICD-10-CM | POA: Diagnosis not present

## 2017-12-04 DIAGNOSIS — S22068A Other fracture of T7-T8 thoracic vertebra, initial encounter for closed fracture: Secondary | ICD-10-CM | POA: Diagnosis not present

## 2017-12-04 DIAGNOSIS — G3183 Dementia with Lewy bodies: Secondary | ICD-10-CM | POA: Diagnosis not present

## 2017-12-04 DIAGNOSIS — R2689 Other abnormalities of gait and mobility: Secondary | ICD-10-CM | POA: Diagnosis not present

## 2017-12-04 DIAGNOSIS — M48062 Spinal stenosis, lumbar region with neurogenic claudication: Secondary | ICD-10-CM | POA: Diagnosis not present

## 2017-12-06 DIAGNOSIS — E039 Hypothyroidism, unspecified: Secondary | ICD-10-CM | POA: Diagnosis not present

## 2017-12-10 DIAGNOSIS — S22068A Other fracture of T7-T8 thoracic vertebra, initial encounter for closed fracture: Secondary | ICD-10-CM | POA: Diagnosis not present

## 2017-12-10 DIAGNOSIS — S22060D Wedge compression fracture of T7-T8 vertebra, subsequent encounter for fracture with routine healing: Secondary | ICD-10-CM | POA: Diagnosis not present

## 2018-03-14 DEATH — deceased

## 2018-09-16 ENCOUNTER — Emergency Department
Admission: EM | Admit: 2018-09-16 | Discharge: 2018-09-16 | Disposition: A | Discharge status: Home / Self Care | Payer: Medicare Other | Attending: Emergency Medicine | Admitting: Emergency Medicine

## 2018-09-16 ENCOUNTER — Emergency Department: Payer: Medicare Other

## 2018-09-16 DIAGNOSIS — W19XXXA Unspecified fall, initial encounter: Secondary | ICD-10-CM

## 2018-09-16 DIAGNOSIS — S20211A Contusion of right front wall of thorax, initial encounter: Secondary | ICD-10-CM | POA: Insufficient documentation

## 2018-09-16 DIAGNOSIS — I499 Cardiac arrhythmia, unspecified: Secondary | ICD-10-CM

## 2018-09-16 DIAGNOSIS — W108XXA Fall (on) (from) other stairs and steps, initial encounter: Secondary | ICD-10-CM | POA: Insufficient documentation

## 2018-09-16 LAB — ECG 12-LEAD
P Wave Axis: 33 deg
P-R Interval: 145 ms
Patient Age: 73 years
Q-T Interval(Corrected): 431 ms
Q-T Interval: 369 ms
QRS Axis: -36 deg
QRS Duration: 92 ms
T Axis: 56 years
Ventricular Rate: 82 //min

## 2018-09-16 MED ORDER — HYDROCODONE-ACETAMINOPHEN 5-325 MG PO TABS
ORAL_TABLET | ORAL | Status: AC
Start: 2018-09-16 — End: ?
  Filled 2018-09-16: qty 1

## 2018-09-16 MED ORDER — HYDROCODONE-ACETAMINOPHEN 5-325 MG PO TABS
1.0000 | ORAL_TABLET | Freq: Once | ORAL | Status: AC
Start: 2018-09-16 — End: 2018-09-16
  Administered 2018-09-16: 11:00:00 1 via ORAL

## 2018-09-16 MED ORDER — HYDROCODONE-ACETAMINOPHEN 5-325 MG PO TABS
1.0000 | ORAL_TABLET | Freq: Four times a day (QID) | ORAL | 0 refills | Status: DC | PRN
Start: 2018-09-16 — End: 2021-02-10

## 2018-09-16 MED ORDER — ONDANSETRON 4 MG PO TBDP
ORAL_TABLET | ORAL | Status: AC
Start: 2018-09-16 — End: ?
  Filled 2018-09-16: qty 1

## 2018-09-16 MED ORDER — ONDANSETRON 4 MG PO TBDP
4.0000 mg | ORAL_TABLET | Freq: Once | ORAL | Status: AC
Start: 2018-09-16 — End: 2018-09-16
  Administered 2018-09-16: 11:00:00 4 mg via ORAL

## 2018-09-16 MED ORDER — ONDANSETRON 8 MG PO TBDP
8.0000 mg | ORAL_TABLET | Freq: Three times a day (TID) | ORAL | 0 refills | Status: DC | PRN
Start: 2018-09-16 — End: 2021-02-10

## 2018-09-16 NOTE — Discharge Instructions (Signed)
Rib Bruise   A rib bruise (contusion) can affect one or more rib bones. It may cause pain, tenderness, swelling, and a purplish discoloration. There may be a sharp pain while breathing.   You will be assessed for other injuries. You will likely be given pain medicine. Bruised ribs heal on their own, without further treatment. But the pain may take weeks to months to go away.   Note that a small crack (fracture) in the rib may cause the same symptoms as a bruised rib. The small crack may not be seen on a chest X-ray. But the conditions are managed in the same way.   Home care   Rest. Don't do heavy lifting, strenuous exertion, or any activity that causes pain.   Ice the area to reduce pain and swelling. Put ice cubes in a plastic bag or use a cold pack. Wrap the cold source in a thin towel. Don't place it directly on your skin. Ice the injured area for 20 minutes every 1 to 2 hours the first day. Continue with ice packs 3 to 4 times a day for the next 2 days, then as needed for the relief of pain and swelling.   Take any prescribed pain medicine as directed by your healthcare provider. If none was prescribed, take acetaminophen,ibuprofen,or naproxento control pain.   If you have a significant injury, you may be given a device called an incentive spirometer to keep your lungs healthy. Use as directed.    Follow-up care  Follow up with your healthcare provider, or as advised.   When to seek medical advice  Call your healthcare provider for any of the following:    Increasing chest pain with breathing   Coughing   New or worseningpain   Fever of 100.4F (38C) or higher, or as directed by your healthcare provider  Call 911  Call 911, or get medical care right away if any of the following occur:    Shortness of breath or trouble breathing   Dizziness, weakness, or fainting  [employer] last reviewed this educational content on 09/11/2017   2000-2020 [employer] [address], [place] [address]. All rights reserved. This information is not intended as a substitute for professional medical care. Always follow your healthcare professional's instructions.

## 2018-09-16 NOTE — ED Triage Notes (Signed)
Pt reports that he was standing on a "block" on Saturday, and he fell onto the steps. Denies LOC. He denies being on thinners.   He states that his right rib cage/side hit the steps.  He is very painful with a deep breath.

## 2018-09-16 NOTE — ED Provider Notes (Signed)
[facility] EMERGENCY DEPARTMENT History and Physical Exam      Patient Name: Henry Hines, Henry Hines  Encounter Date:  09/16/2018  Attending Physician: Veverly Fells. Generoso Cropper M.D.  PCP: Anise Salvo, MD  Patient DOB:  1945-12-30  MRN:  [id number]  Room:  C19/C19-A      History of Presenting Illness     Chief complaint: Fall    HPI/ROS is limited by: none  HPI/ROS given by: patient and family    Location: R ribs  Duration: 4 days  Severity: moderate    Henry Hines is a 73 y.o. male who presents with an injury to his right ribs.  The patient states that on Saturday, 4 days ago he was working on his house.  He was standing on a piece of tile flu liner.  He was trying to pull something that was taped up and he lost his balance.  The patient states that he fell on his right side on wooden steps.  He landed on his right ribs.  He states that he initially was okay but over the next 24 hours he has had worsening pain to his right ribs.  He does complain of pain with deep inspiration.  He rarely has pain with movement.  He states that he did hit his head but he has no headache at this time.  He denies loss consciousness.  He denies neck pain.  He denies any abdominal pain.  He has no other complaints.      Review of Systems     Review of Systems   Constitutional: Negative for chills and fever.   HENT: Negative for congestion and sore throat.    Eyes: Negative.    Respiratory: Negative for cough and shortness of breath.    Cardiovascular: Positive for chest pain (R ribs).   Gastrointestinal: Negative for abdominal pain, diarrhea and vomiting.   Musculoskeletal: Negative for back pain and neck pain.   Skin: Negative for rash.   Neurological: Negative.  Negative for loss of consciousness and headaches.   Psychiatric/Behavioral: Negative.          Allergies     Pt is allergic to oxycodone.      Medications       Current Facility-Administered Medications:     HYDROcodone-acetaminophen (NORCO) 5-325 MG per tablet 1 tablet, 1 tablet, Oral, Once  in ED, Jocilyn Trego, Veverly Fells, MD    ondansetron (ZOFRAN-ODT) disintegrating tablet 4 mg, 4 mg, Oral, Once in ED, Myna Bright, MD    Current Outpatient Medications:     aspirin EC 81 MG EC tablet, Take 81 mg by mouth every evening.  , Disp: , Rfl:     lisinopril (PRINIVIL,ZESTRIL) 2.5 MG tablet, Take 2.5 mg by mouth every evening., Disp: , Rfl:     omeprazole (PRILOSEC) 20 MG capsule, Take 20 mg by mouth every evening., Disp: , Rfl:     pravastatin (PRAVACHOL) 80 MG tablet, Take 80 mg by mouth every evening., Disp: , Rfl:     HYDROcodone-acetaminophen (NORCO) 5-325 MG per tablet, Take 1-2 tablets by mouth every 6 (six) hours as needed for Pain, Disp: 12 tablet, Rfl: 0    insulin lispro (HUMALOG) 100 UNIT/ML injection, Inject 10 Units into the skin 2 (two) times daily before meals.5 units at lunch and dinner   , Disp: , Rfl:     metFORMIN (GLUCOPHAGE) 500 MG tablet, Take 1,000 mg by mouth 2 (two) times daily with meals.Take two tablets (1,000 mg total) twice  a day  , Disp: , Rfl:     ondansetron (Zofran ODT) 8 MG disintegrating tablet, Take 1 tablet (8 mg total) by mouth every 8 (eight) hours as needed for Nausea, Disp: 12 tablet, Rfl: 0       Past Medical History     Pt has a past medical history of Diabetes mellitus, GERD (gastroesophageal reflux disease), Hyperlipidemia, Hypertension, Prostate cancer, and Type 2 diabetes mellitus, controlled.      Past Surgical History     Pt has a past surgical history that includes EGD (03/23/2013); LAPAROSCOPIC, CHOLECYSTECTOMY, CHOLANGIOGRAM (03/24/2013); Appendectomy; Cholecystectomy; Hernia repair; Transurethral resection of prostate (2012); and EGD, COLONOSCOPY (N/A, 12/20/2015).      Family History     The family history is not on file.      Social History     Pt reports that he has quit smoking. He has never used smokeless tobacco. He reports that he does not drink alcohol or use drugs.      Physical Exam     Blood pressure 120/66, pulse 98, temperature 98.5 F  (36.9 C), temperature source Skin, resp. rate 18, height 1.829 m, weight 83.9 kg, SpO2 96 %.    Physical Exam   Constitutional: He is oriented to person, place, and time. He appears distressed (mild due to pain).   HENT:   Head: Head is without abrasion and without contusion.   Eyes: Conjunctivae and EOM are normal.   Neck: Normal range of motion and full passive range of motion without pain. Neck supple. No spinous process tenderness present.   Cardiovascular: Normal rate and normal heart sounds. An irregularly irregular rhythm present.   Pulmonary/Chest: Effort normal and breath sounds normal. No respiratory distress. He exhibits tenderness and bony tenderness (Right lateral ribs).   Abdominal: Soft. There is no abdominal tenderness.   Musculoskeletal: Normal range of motion.   Neurological: He is alert and oriented to person, place, and time. Gait normal.   Skin: Skin is warm and dry.   Psychiatric: Affect and judgment normal.   Nursing note and vitals reviewed.    Orders Placed     Orders Placed This Encounter   Procedures    XR Ribs Right W PA Chest    ECG 12 lead (Specify reason for exam)     EKG Results     Procedure Component Value Units Date/Time    ECG 12 lead (Specify reason for exam) [[id number]] Collected:  09/16/18 0954     Updated:  09/16/18 1002     Patient Age 7 years      Patient DOB 24-Apr-1945     Patient Height --     Patient Weight --     Interpretation Text --     Sinus rhythm  Multiple premature complexes, vent & supraven  Left axis deviation  Borderline low voltage, extremity leads  Compared to ECG 04/27/2017 14:17:28  Electronically Signed On 09-16-2018 10:00:51 EDT by Mariea Stable       Physician Interpreter Mariea Stable     Ventricular Rate 82 //min      QRS Duration 92 ms      P-R Interval 145 ms      Q-T Interval 369 ms      Q-T Interval(Corrected) 431 ms      P Wave Axis 33 deg      QRS Axis -36 deg      T Axis 56 years         Diagnostic Results  The results of the  diagnostic studies below have been reviewed by myself:    Labs  Results     ** No results found for the last 24 hours. **          Radiologic Studies  Radiology Results (24 Hour)     Procedure Component Value Units Date/Time    XR Ribs Right W PA Chest [[id number]] Collected:  09/16/18 1015    Order Status:  Completed Updated:  09/16/18 1019    Narrative:       INDICATION:  Fall, right rib injury    EXAMINATION: XR RIBS RIGHT W PA CHEST    COMPARISON: Chest radiographs from 04/27/2017.    TECHNIQUE: Chest: PA chest. and 4 views of the right ribs.    FINDINGS:    Chest:  Hardware: Right upper quadrant surgical clips.  Lungs and pleura: No pneumothorax. No pneumonia.  Cardiomediastinal silhouette: Normal heart size.    Bones and soft tissues: No acute abnormality. Pancreatic calcifications in keeping with chronic pancreatitis.    Ribs:   No fracture.      Impression:       IMPRESSION:     1.  No acute cardiopulmonary process.   2. No acute fracture of the right ribs.    ReadingStation:[id number]            ED Course     The patient remained stable during his course in the emergency room.  On auscultation the patient had an irregular heartbeat so an EKG was performed.  This showed multiple PVCs but no evidence of atrial fibrillation.  X-Hines of his ribs and chest showed no acute abnormality or displaced rib fracture.  The patient has no abdominal tenderness specifically in the right upper quadrant.  The patient will be treated with pain medication as needed.      MDM / Critical Care     Blood pressure 120/66, pulse 98, temperature 98.5 F (36.9 C), temperature source Skin, resp. rate 18, height 1.829 m, weight 83.9 kg, SpO2 96 %.    The patient was evaluated for right rib pain after a fall.  The differential would include rib fracture, rib contusion, pneumothorax, head or neck injury, as well as intra-abdominal injury or bleeding.      Procedures         Diagnosis / Disposition     Clinical Impression  1. Rib contusion,  right, initial encounter    2. Fall, initial encounter        Disposition  ED Disposition     ED Disposition Condition Date/Time Comment    Discharge  Wed Sep 16, 2018 11:22 AM Dorina Hoyer discharge to home/self care.    Condition at disposition: Stable          Prescriptions  New Prescriptions    HYDROCODONE-ACETAMINOPHEN (NORCO) 5-325 MG PER TABLET    Take 1-2 tablets by mouth every 6 (six) hours as needed for Pain    ONDANSETRON (ZOFRAN ODT) 8 MG DISINTEGRATING TABLET    Take 1 tablet (8 mg total) by mouth every 8 (eight) hours as needed for Nausea         In addition to the above history, please see nursing notes.  Allergies, meds, past medical, family, social, history and the results from the diagnostic studies performed have been reviewed by myself.     This note has been created by an Electronic Medical Record that may contain additions or subtractions not intended by myself.  Myna Bright, MD     Myna Bright, MD  09/16/18 [phone]

## 2019-08-25 ENCOUNTER — Encounter (HOSPITAL_BASED_OUTPATIENT_CLINIC_OR_DEPARTMENT_OTHER): Payer: Self-pay

## 2019-08-25 SURGERY — DONT USE, USE 1094-COLONOSCOPY, DIAGNOSTIC (SCREENING)
Anesthesia: Conscious Sedation | Site: Anus

## 2019-12-13 ENCOUNTER — Encounter (HOSPITAL_BASED_OUTPATIENT_CLINIC_OR_DEPARTMENT_OTHER): Payer: Self-pay

## 2019-12-13 SURGERY — DONT USE, USE 1094-COLONOSCOPY, DIAGNOSTIC (SCREENING)
Anesthesia: Conscious Sedation | Site: Anus

## 2020-10-03 ENCOUNTER — Inpatient Hospital Stay: Admission: RE | Admit: 2020-10-03 | Payer: Self-pay | Source: Ambulatory Visit

## 2020-10-03 ENCOUNTER — Ambulatory Visit
Admission: RE | Admit: 2020-10-03 | Discharge: 2020-10-03 | Disposition: A | Discharge status: Home / Self Care | Payer: Medicare Other | Source: Ambulatory Visit | Attending: Internal Medicine | Admitting: Internal Medicine

## 2020-10-03 ENCOUNTER — Encounter: Payer: Self-pay | Admitting: Internal Medicine

## 2020-10-03 DIAGNOSIS — R0989 Other specified symptoms and signs involving the circulatory and respiratory systems: Secondary | ICD-10-CM | POA: Insufficient documentation

## 2020-10-03 DIAGNOSIS — J8410 Pulmonary fibrosis, unspecified: Secondary | ICD-10-CM | POA: Insufficient documentation

## 2021-02-09 ENCOUNTER — Observation Stay
Admission: EM | Admit: 2021-02-09 | Discharge: 2021-02-10 | Disposition: A | Discharge status: Home / Self Care | Payer: Medicare Other | Attending: Emergency Medicine | Admitting: Emergency Medicine

## 2021-02-09 ENCOUNTER — Emergency Department: Payer: Medicare Other

## 2021-02-09 DIAGNOSIS — I10 Essential (primary) hypertension: Secondary | ICD-10-CM | POA: Insufficient documentation

## 2021-02-09 DIAGNOSIS — R0602 Shortness of breath: Secondary | ICD-10-CM | POA: Insufficient documentation

## 2021-02-09 DIAGNOSIS — Z8546 Personal history of malignant neoplasm of prostate: Secondary | ICD-10-CM | POA: Insufficient documentation

## 2021-02-09 DIAGNOSIS — Z87891 Personal history of nicotine dependence: Secondary | ICD-10-CM | POA: Insufficient documentation

## 2021-02-09 DIAGNOSIS — Z7982 Long term (current) use of aspirin: Secondary | ICD-10-CM | POA: Insufficient documentation

## 2021-02-09 DIAGNOSIS — R072 Precordial pain: Principal | ICD-10-CM | POA: Insufficient documentation

## 2021-02-09 DIAGNOSIS — Z9049 Acquired absence of other specified parts of digestive tract: Secondary | ICD-10-CM | POA: Insufficient documentation

## 2021-02-09 DIAGNOSIS — Z8249 Family history of ischemic heart disease and other diseases of the circulatory system: Secondary | ICD-10-CM | POA: Insufficient documentation

## 2021-02-09 DIAGNOSIS — Z79899 Other long term (current) drug therapy: Secondary | ICD-10-CM | POA: Insufficient documentation

## 2021-02-09 DIAGNOSIS — Z885 Allergy status to narcotic agent status: Secondary | ICD-10-CM | POA: Insufficient documentation

## 2021-02-09 DIAGNOSIS — Z794 Long term (current) use of insulin: Secondary | ICD-10-CM | POA: Insufficient documentation

## 2021-02-09 DIAGNOSIS — E785 Hyperlipidemia, unspecified: Secondary | ICD-10-CM

## 2021-02-09 DIAGNOSIS — R079 Chest pain, unspecified: Secondary | ICD-10-CM | POA: Diagnosis present

## 2021-02-09 DIAGNOSIS — K219 Gastro-esophageal reflux disease without esophagitis: Secondary | ICD-10-CM

## 2021-02-09 DIAGNOSIS — E119 Type 2 diabetes mellitus without complications: Secondary | ICD-10-CM | POA: Insufficient documentation

## 2021-02-09 LAB — COMPREHENSIVE METABOLIC PANEL
ALT: 21 U/L (ref 0–55)
AST (SGOT): 20 U/L (ref 10–42)
Albumin/Globulin Ratio: 1.06 Ratio (ref 0.80–2.00)
Albumin: 3.8 gm/dL (ref 3.5–5.0)
Alkaline Phosphatase: 114 U/L (ref 40–145)
Anion Gap: 14.3 mMol/L (ref 7.0–18.0)
BUN / Creatinine Ratio: 11.6 Ratio (ref 10.0–30.0)
BUN: 17 mg/dL (ref 7–22)
Bilirubin, Total: 0.8 mg/dL (ref 0.1–1.2)
CO2: 24 mMol/L (ref 20–30)
Calcium: 9.7 mg/dL (ref 8.5–10.5)
Chloride: 102 mMol/L (ref 98–110)
Creatinine: 1.47 mg/dL — ABNORMAL HIGH (ref 0.80–1.30)
EGFR: 49 mL/min/{1.73_m2} — ABNORMAL LOW (ref 60–150)
Globulin: 3.6 gm/dL (ref 2.0–4.0)
Glucose: 225 mg/dL — ABNORMAL HIGH (ref 71–99)
Osmolality Calculated: 281 mOsm/kg (ref 275–300)
Potassium: 4.3 mMol/L (ref 3.5–5.3)
Protein, Total: 7.4 gm/dL (ref 6.0–8.3)
Sodium: 136 mMol/L (ref 136–147)

## 2021-02-09 LAB — CBC AND DIFFERENTIAL
Basophils %: 0.4 % (ref 0.0–3.0)
Basophils Absolute: 0 10*3/uL (ref 0.0–0.3)
Eosinophils %: 2.6 % (ref 0.0–7.0)
Eosinophils Absolute: 0.2 10*3/uL (ref 0.0–0.8)
Hematocrit: 46.1 % (ref 39.0–52.5)
Hemoglobin: 14.9 gm/dL (ref 13.0–17.5)
Lymphocytes Absolute: 2 10*3/uL (ref 0.6–5.1)
Lymphocytes: 21.8 % (ref 15.0–46.0)
MCH: 30 pg (ref 28–35)
MCHC: 32 gm/dL (ref 32–36)
MCV: 93 fL (ref 80–100)
MPV: 6.6 fL (ref 6.0–10.0)
Monocytes Absolute: 1 10*3/uL (ref 0.1–1.7)
Monocytes: 10.6 % (ref 3.0–15.0)
Neutrophils %: 64.6 % (ref 42.0–78.0)
Neutrophils Absolute: 6 10*3/uL (ref 1.7–8.6)
PLT CT: 287 10*3/uL (ref 130–440)
RBC: 4.97 10*6/uL (ref 4.00–5.70)
RDW: 12.1 % (ref 11.0–14.0)
WBC: 9.3 10*3/uL (ref 4.0–11.0)

## 2021-02-09 LAB — PT/INR
PT INR: 1 (ref 0.9–1.1)
PT: 10.1 s (ref 9.4–11.5)

## 2021-02-09 LAB — TROPONIN I: Troponin I: <0.01 ng/mL (ref 0.00–0.02)

## 2021-02-09 LAB — B-TYPE NATRIURETIC PEPTIDE: B-Natriuretic Peptide: 33.6 pg/mL (ref 0.0–100.0)

## 2021-02-10 ENCOUNTER — Observation Stay: Payer: Medicare Other

## 2021-02-10 LAB — VH DEXTROSE STICK GLUCOSE
Glucose POCT: 178 mg/dL — ABNORMAL HIGH (ref 71–99)
Glucose POCT: 185 mg/dL — ABNORMAL HIGH (ref 71–99)
Glucose POCT: 183 mg/dL — ABNORMAL HIGH (ref 71–99)

## 2021-02-10 LAB — BASIC METABOLIC PANEL
Anion Gap: 14 mMol/L (ref 7.0–18.0)
BUN / Creatinine Ratio: 10.6 Ratio (ref 10.0–30.0)
BUN: 14 mg/dL (ref 7–22)
CO2: 25 mMol/L (ref 20–30)
Calcium: 9.5 mg/dL (ref 8.5–10.5)
Chloride: 103 mMol/L (ref 98–110)
Creatinine: 1.32 mg/dL — ABNORMAL HIGH (ref 0.80–1.30)
EGFR: 56 mL/min/{1.73_m2} — ABNORMAL LOW (ref 60–150)
Glucose: 217 mg/dL — ABNORMAL HIGH (ref 71–99)
Osmolality Calculated: 283 mOsm/kg (ref 275–300)
Potassium: 4 mMol/L (ref 3.5–5.3)
Sodium: 138 mMol/L (ref 136–147)

## 2021-02-10 LAB — HEMOGLOBIN A1C
Estimated Average Glucose: 154 mg/dL
Hgb A1C, %: 7 %

## 2021-02-10 LAB — LIPID PANEL
Cholesterol: 151 mg/dL (ref 75–199)
Coronary Heart Disease Risk: 2.6
HDL: 58 mg/dL — ABNORMAL HIGH (ref 40–55)
LDL Calculated: 79 mg/dL
Triglycerides: 69 mg/dL (ref 10–150)
VLDL: 14 (ref 0–40)

## 2021-02-10 LAB — D-DIMER, QUANTITATIVE: D-Dimer: 0.87 mg/L FEU — ABNORMAL HIGH (ref 0.19–0.52)

## 2021-02-10 LAB — TROPONIN I
Troponin I: 0.01 ng/mL (ref 0.00–0.02)
Troponin I: 0.01 ng/mL (ref 0.00–0.02)

## 2021-02-10 MED ORDER — TECHNETIUM TC 99M SESTAMIBI - CARDIOLITE
11.0000 | Freq: Once | Status: AC | PRN
Start: 2021-02-10 — End: 2021-02-10
  Administered 2021-02-10: 11 via INTRAVENOUS

## 2021-02-10 MED ORDER — SODIUM CHLORIDE (PF) 0.9 % IJ SOLN
0.4000 mg | INTRAMUSCULAR | Status: DC | PRN
Start: 2021-02-10 — End: 2021-02-10

## 2021-02-10 MED ORDER — LISINOPRIL 5 MG PO TABS
2.5000 mg | ORAL_TABLET | Freq: Every evening | ORAL | Status: DC
Start: 2021-02-10 — End: 2021-02-10
  Filled 2021-02-10: qty 1

## 2021-02-10 MED ORDER — ASPIRIN 81 MG PO TBEC
81.0000 mg | DELAYED_RELEASE_TABLET | Freq: Every evening | ORAL | Status: DC
Start: 2021-02-10 — End: 2021-02-10
  Filled 2021-02-10: qty 1

## 2021-02-10 MED ORDER — IOHEXOL 350 MG/ML IV SOLN
60.0000 mL | Freq: Once | INTRAVENOUS | Status: AC | PRN
Start: 2021-02-10 — End: 2021-02-10
  Administered 2021-02-10: 60 mL via INTRAVENOUS

## 2021-02-10 MED ORDER — ONDANSETRON 4 MG PO TBDP
4.0000 mg | ORAL_TABLET | Freq: Four times a day (QID) | ORAL | Status: DC | PRN
Start: 2021-02-10 — End: 2021-02-10

## 2021-02-10 MED ORDER — LIDOCAINE VISCOUS HCL 2 % MT SOLN
OROMUCOSAL | Status: AC
Start: 2021-02-10 — End: ?
  Filled 2021-02-10: qty 15

## 2021-02-10 MED ORDER — NITROGLYCERIN 0.4 MG SL SUBL
0.4000 mg | SUBLINGUAL_TABLET | SUBLINGUAL | Status: DC | PRN
Start: 2021-02-10 — End: 2021-02-10

## 2021-02-10 MED ORDER — SODIUM CHLORIDE 0.9 % IV SOLN
Freq: Once | INTRAVENOUS | Status: AC
Start: 2021-02-10 — End: 2021-02-10

## 2021-02-10 MED ORDER — LIDOCAINE VISCOUS HCL 2 % MT SOLN
Freq: Once | ORAL | Status: AC
Start: 2021-02-10 — End: 2021-02-10

## 2021-02-10 MED ORDER — TECHNETIUM TC 99M SESTAMIBI - CARDIOLITE
32.6000 | Freq: Once | Status: AC | PRN
Start: 2021-02-10 — End: 2021-02-10
  Administered 2021-02-10: 33 via INTRAVENOUS

## 2021-02-10 MED ORDER — FAMOTIDINE 20 MG PO TABS
20.0000 mg | ORAL_TABLET | Freq: Two times a day (BID) | ORAL | Status: DC
Start: 2021-02-10 — End: 2021-02-10
  Filled 2021-02-10: qty 1

## 2021-02-10 MED ORDER — SODIUM CHLORIDE (PF) 0.9 % IJ SOLN
3.0000 mL | Freq: Two times a day (BID) | INTRAMUSCULAR | Status: DC
Start: 2021-02-10 — End: 2021-02-10
  Administered 2021-02-10 (×2): 3 mL via INTRAVENOUS

## 2021-02-10 MED ORDER — INSULIN LISPRO (1 UNIT DIAL) 100 UNIT/ML SC SOPN
2.0000 [IU] | PEN_INJECTOR | Freq: Three times a day (TID) | SUBCUTANEOUS | Status: DC
Start: 2021-02-10 — End: 2021-02-10
  Administered 2021-02-10: 2 [IU] via SUBCUTANEOUS
  Filled 2021-02-10: qty 3

## 2021-02-10 MED ORDER — PANTOPRAZOLE SODIUM 40 MG PO TBEC
40.0000 mg | DELAYED_RELEASE_TABLET | Freq: Every day | ORAL | 0 refills | Status: AC
Start: 2021-02-10 — End: 2021-03-12

## 2021-02-10 MED ORDER — ATORVASTATIN CALCIUM 20 MG PO TABS
10.0000 mg | ORAL_TABLET | Freq: Every day | ORAL | Status: DC
Start: 2021-02-10 — End: 2021-02-10

## 2021-02-10 MED ORDER — MORPHINE SULFATE 4 MG/ML IJ/IV SOLN (WRAP)
2.0000 mg | Status: DC | PRN
Start: 2021-02-10 — End: 2021-02-10

## 2021-02-10 MED ORDER — GLUCAGON 1 MG IJ SOLR (WRAP)
1.0000 mg | INTRAMUSCULAR | Status: DC | PRN
Start: 2021-02-10 — End: 2021-02-10

## 2021-02-10 MED ORDER — AMOXICILLIN 500 MG PO TABS
500.0000 mg | ORAL_TABLET | Freq: Once | ORAL | Status: AC
Start: 2021-02-10 — End: 2021-02-10
  Administered 2021-02-10: 500 mg via ORAL

## 2021-02-10 MED ORDER — ONDANSETRON HCL 4 MG/2ML IJ SOLN
4.0000 mg | Freq: Four times a day (QID) | INTRAMUSCULAR | Status: DC | PRN
Start: 2021-02-10 — End: 2021-02-10

## 2021-02-10 MED ORDER — SUCRALFATE 1 G PO TABS
1.0000 g | ORAL_TABLET | Freq: Four times a day (QID) | ORAL | 2 refills | Status: AC
Start: 2021-02-10 — End: ?

## 2021-02-10 MED ORDER — ALUM & MAG HYDROXIDE-SIMETH 200-200-20 MG/5ML PO SUSP
ORAL | Status: AC
Start: 2021-02-10 — End: ?
  Filled 2021-02-10: qty 30

## 2021-02-10 MED ORDER — DICYCLOMINE HCL 10 MG/5ML PO SOLN
ORAL | Status: AC
Start: 2021-02-10 — End: ?
  Filled 2021-02-10: qty 5

## 2021-02-10 MED ORDER — AMOXICILLIN 500 MG PO TABS
ORAL_TABLET | ORAL | Status: AC
Start: 2021-02-10 — End: ?
  Filled 2021-02-10: qty 1

## 2021-02-10 MED ORDER — ACETAMINOPHEN 650 MG RE SUPP
650.0000 mg | RECTAL | Status: DC | PRN
Start: 2021-02-10 — End: 2021-02-10

## 2021-02-10 MED ORDER — VH SODIUM CHLORIDE 0.9 % IV BOLUS
500.0000 mL | Freq: Once | INTRAVENOUS | Status: AC
Start: 2021-02-10 — End: 2021-02-10
  Administered 2021-02-10: 500 mL via INTRAVENOUS

## 2021-02-10 MED ORDER — VH DEXTROSE 10 % IV BOLUS (ADULT)
125.0000 mL | INTRAVENOUS | Status: DC | PRN
Start: 2021-02-10 — End: 2021-02-10

## 2021-02-10 MED ORDER — ACETAMINOPHEN 160 MG/5ML PO SOLN
650.0000 mg | ORAL | Status: DC | PRN
Start: 2021-02-10 — End: 2021-02-10

## 2021-02-10 MED ORDER — INSULIN LISPRO (1 UNIT DIAL) 100 UNIT/ML SC SOPN
1.0000 [IU] | PEN_INJECTOR | Freq: Every day | SUBCUTANEOUS | Status: DC | PRN
Start: 2021-02-10 — End: 2021-02-10

## 2021-02-10 MED ORDER — METFORMIN HCL 500 MG PO TABS
1000.0000 mg | ORAL_TABLET | Freq: Two times a day (BID) | ORAL | Status: DC
Start: 2021-02-10 — End: 2021-02-10
  Filled 2021-02-10: qty 2

## 2021-02-10 MED ORDER — METFORMIN HCL 500 MG PO TABS
500.0000 mg | ORAL_TABLET | Freq: Two times a day (BID) | ORAL | Status: DC
Start: 2021-02-10 — End: 2021-02-10
  Administered 2021-02-10: 500 mg via ORAL

## 2021-02-10 MED ORDER — INSULIN LISPRO (1 UNIT DIAL) 100 UNIT/ML SC SOPN
1.0000 [IU] | PEN_INJECTOR | Freq: Every evening | SUBCUTANEOUS | Status: DC
Start: 2021-02-10 — End: 2021-02-10

## 2021-02-10 MED ORDER — ACETAMINOPHEN 325 MG PO TABS
650.0000 mg | ORAL_TABLET | ORAL | Status: DC | PRN
Start: 2021-02-10 — End: 2021-02-10

## 2021-02-10 MED ORDER — INSULIN LISPRO (1 UNIT DIAL) 100 UNIT/ML SC SOPN
10.0000 [IU] | PEN_INJECTOR | Freq: Two times a day (BID) | SUBCUTANEOUS | Status: DC
Start: 2021-02-10 — End: 2021-02-10

## 2021-02-10 MED ORDER — INSULIN LISPRO (1 UNIT DIAL) 100 UNIT/ML SC SOPN
8.0000 [IU] | PEN_INJECTOR | Freq: Two times a day (BID) | SUBCUTANEOUS | Status: DC
Start: 2021-02-10 — End: 2021-02-10
  Administered 2021-02-10: 8 [IU] via SUBCUTANEOUS

## 2021-02-10 NOTE — Plan of Care (Signed)
Problem: Chest Pain  Goal: Vital signs and cardiac rhythm stable  Outcome: Progressing  Flowsheets (Taken 02/10/2021 0407)  Vital signs and cardiac rhythm stable:   Monitor /assess vital signs/cardiac rhythms   Monitor labs   Assess the need for oxygen therapy and administer as ordered  Goal: Cardiac pain management  Outcome: Progressing  Flowsheets (Taken 02/10/2021 0407)  Cardiac pain management:   Assess/report chest pain/or related discomfort to LIP immediately   Instruct patient to report any change in pain status   Assess pain/or related discomfort on admission, during daily assessment, before and after any intervention   Include patient and patient care companion in decisions related to pain management  Goal: Anxiety management/effective coping  Outcome: Progressing  Flowsheets (Taken 02/10/2021 0407)  Anxiety management/effective coping:   Assess/report uncontrolled anxiety, depression or ineffective coping to LIP   Offer reassurance to decrease anxiety   Encourage patient to immediately report any increase in anxiety and/or depression   Include patient in decision making of their care and give updates on their health status

## 2021-02-10 NOTE — Progress Notes (Signed)
Pt completed exercise cardiac stress test.  THR achieved. VSS.  No cp. + soboe. Tolerated well.    Caylei Sperry, RN, RRT  Nuclear Medicine Stress Lab RN  [phone]

## 2021-02-10 NOTE — UM Notes (Signed)
[facility] Utilization Management Review Sheet    Facility :  [facility]    NAME: Henry Hines  MR#: [id number]    CSN#: [id number]    ROOM: 2517/2517-A AGE: 75 y.o.    Date of Birth: 06/27/1945    ADMIT DATE AND TIME: 02/10/2021 @ 0249    PATIENT CLASS: OBS    ATTENDING PHYSICIAN: Donalynn Furlong, MD      PAYOR:Payor: MEDICARE / Plan: MEDICARE PART A AND B / Product Type: Medicare /     AUTH #: NAR    DIAGNOSIS:     ICD-10-CM    1. Chest pain, rule out acute myocardial infarction  R07.9           HISTORY:   Past Medical History:   Diagnosis Date    Diabetes mellitus     GERD (gastroesophageal reflux disease)     Hyperlipidemia     Hypertension     Prostate cancer     Type 2 diabetes mellitus, controlled      History of present illness: The patient presents to the emergency department with constant chest pain since yesterday evening.  He describes the pain as radiating across his chest.  The pain is worse with inspiration and food intake.      Vitals upon admission: 37.1, 82, 99%, 19, 152/98    Treatment in emergency department: Ekg, gi cocktail po x 1, amoxicillin po x 1    Physical exam: Per md note unremarkable    Abnormal labs:  Glucose 225  Creatinine 1.47  Egfr 49    Imaging:  Chest xray: No acute findings.     Assessment/Plan:  NM Stress test  Chemsticks with ssi  ASA 81 mg po qd  NPO  Glocose poct ac @ hs  Skin assessment qd  Vital signs q 8 hours  Telemetry  Cardiac monitoring    Observation appropriate per MCG-25th edition OC-009    Mardelle Matte, RN, BSN  [facility]   Utilization Management  Phone:  [phone]  Fax:  [phone]

## 2021-02-10 NOTE — Discharge Summary (Signed)
[facility]      Patient: Henry Hines  Admission Date: 02/09/2021   DOB: [birth year]  Discharge Date: 02/10/2021    MRN: [id number]  Discharge Attending: Donalynn Furlong, MD    Referring Physician: Anise Salvo, MD  PCP: Anise Salvo, MD       DISCHARGE SUMMARY     Discharge Information   Admission Diagnosis:   Chest pain.    Discharge Diagnosis:   Patient Active Problem List    Diagnosis Date Noted    Chest pain 09/15/2015    Diabetes mellitus type 2 in nonobese 03/22/2013    Common bile duct dilatation 03/22/2013    Colic, biliary 03/22/2013    Biliary colic 03/22/2013    AKI (acute kidney injury) 03/22/2013        Discharge Medications:     Medication List        ASK your doctor about these medications      aspirin EC 81 MG EC tablet     atorvastatin 10 MG tablet  Commonly known as: LIPITOR     Ferrous Fumarate 324 (106 Fe) MG Tabs     insulin lispro 100 UNIT/ML injection  Commonly known as: HumaLOG     lisinopril 2.5 MG tablet  Commonly known as: ZESTRIL     metFORMIN 500 MG tablet  Commonly known as: GLUCOPHAGE     omeprazole 20 MG capsule  Commonly known as: PriLOSEC     Toujeo SoloStar 300 UNIT/ML injection pen  Generic drug: Insulin Glargine (1 Unit Dial)                  Hospital Course   Presentation History   Patient presented with substernal chest pain which was present for 24 hours.  Patient was a constant dull ache with some pressure sensation.  He states it felt like severe indigestion.  He does have a history of reflux.  He says his reflux pain is usually not that bad.  He is currently taking omeprazole.  He did have some shortness of breath with the pain.  He received a GI cocktail in the emergency department and symptoms resolved.  Hemoglobin was 14.9.  Glucose was 217.  Creatinine was 1.32.  Troponins were normal.  Chest x-ray no acute disease.  D-dimer was elevated.  CT of the chest showed no PE.    Hospital Course (0 Days)   Nuclear medicine stress test was  low risk.  Patient has an appointment with Dr. Kizzie Ide for endoscopy in February.  I asked him to discontinue the omeprazole for 1 month and take Protonix.  I also prescribed Carafate.  He will return here as needed if worse.    Procedures/Imaging:   NM Myocardial Perfusion Spect (Stress And Rest)   Final Result      CT Angiogram Chest   Final Result   1. Negative for PE.    2. No acute abnormality.      ReadingStation:[email or web address]      XR Chest 2 Views   Final Result   No acute findings.      ReadingStation:[email or web address]      CV Cardiac Stress Test Tracing Only    (Results Pending)       Treatment Team:   Attending Provider: Donalynn Furlong, MD       Progress Note/Physical Exam at Discharge   Vitals:    02/10/21 1145 02/10/21 1150 02/10/21 1155 02/10/21 1225   BP:  140/74   Pulse: 64 67 66 65   Resp: 14 16 15  (!) 11   Temp:    97.8 F (36.6 C)   TempSrc:    Temporal   SpO2:    98%   Weight:       Height:           Patient is alert and in no distress.  Lungs are clear.  Heart has regular rhythm.  Abdomen is soft and nontender.     Diagnostics     Stress Test Results: Negative EKG response to exercise stress at heart rate achieved.    Labs/Studies Pending at Discharge: No    Last Labs   Recent Labs   Lab 02/09/21  1944   WBC 9.3   RBC 4.97   Hemoglobin 14.9   Hematocrit 46.1   MCV 93   PLT CT 287       Recent Labs   Lab 02/10/21  0345 02/09/21  1944   Sodium 138 136   Potassium 4.0 4.3   Chloride 103 102   CO2 25 24   BUN 14 17   Creatinine 1.32* 1.47*   Glucose 217* 225*   Calcium 9.5 9.7        Patient Instructions   Discharge Diet: regular diet  Discharge Activity:  activity as tolerated    Follow Up Appointment:       Time spent examining patient, discussing with patient/family regarding hospital course, chart review, reconciling medications and discharge planning: 20 minutes.      Donalynn Furlong, MD    12:52 PM 02/10/2021

## 2021-02-10 NOTE — ED Provider Notes (Signed)
[facility]  Emergency Department       Patient Name: Henry Hines, Henry Hines Patient DOB:  08-02-1945   Encounter Date:  02/09/2021 Age: 75 y.o. male   Attending ED Physician: Dierdre Searles, D.O. MRN:  [id number]   Room:  2517/2517-A PCP: Anise Salvo, MD      Diagnosis / Disposition:   Final Impression  1. Chest pain, rule out acute myocardial infarction        Disposition  ED Disposition       ED Disposition   Admit    Condition   --    Date/Time   Sat Feb 10, 2021  2:47 AM    Comment   Service: Observation Unit [[id number]]                 Follow up  No follow-up provider specified.    Prescriptions  Current Discharge Medication List            History of Presenting Illness:   Chief complaint: Chest Pain    HPI/ROS is limited by: none  HPI/ROS given by: Patient and Family    Location: midsternal  Quality: burning, pressure  Duration: x 24 hours  Severity: moderate          Nursing Notes reviewed and acknowledged.    Henry Hines is a 75 y.o. male with PMH of DM, HTN, HLD presents with midsternal chest pain x 24 hours. Patient states it started after eating pizza and thought it was heartburn, but the pain persistent and was different than his usual GERD. It was intense, throbbing, pressure, and radiated throughout his chest. Associated with difficulty taking deep breaths. He states this has never happened to him before. Associated with some nausea. He took Tums without relief. He denies abdominal pain, vomiting, numbness, weakness, fevers, chills, cough, congestion, feeling faint. Patient denies cardiac history or prior DVT/PE.     In addition to the above history, please see nursing notes. Allergies, meds, past medical, family, social hx, and the results of the diagnostic studies performed have been reviewed by myself.      Review of Systems   General: No fevers/chills, No sweating  ENT:   No congestion. No sore throat.    Respiratory: No cough.  + shortness of breath.  Cardiovascular: + chest pain.  No  palpitations.  GI: + Nausea, No Vomiting, No abdominal pain  Neurological:  No headache.  No weakness. No numbness  Musculoskeletal:  No neck, back, or extremity pain  Skin:  No rash.  No skin lesions.  Psychiatric:  No depression.  No anxiety.      All other systems reviewed and negative except as above, pertinent findings in HPI.          Allergies / Medications:   Pt is allergic to oxycodone.    Current Discharge Medication List        CONTINUE these medications which have NOT CHANGED    Details   aspirin EC 81 MG EC tablet Take 81 mg by mouth every evening.          atorvastatin (LIPITOR) 10 MG tablet Take 10 mg by mouth daily      Ferrous Fumarate 324 (106 Fe) MG Tab 1 tablet daily      insulin lispro (HUMALOG) 100 UNIT/ML injection Inject 10 Units into the skin 2 (two) times daily before meals.5 units at lunch and dinner           lisinopril (PRINIVIL,ZESTRIL)  2.5 MG tablet Take 2.5 mg by mouth every evening.      metFORMIN (GLUCOPHAGE) 500 MG tablet Take 1,000 mg by mouth 2 (two) times daily with meals.Take two tablets (1,000 mg total) twice a day          omeprazole (PRILOSEC) 20 MG capsule Take 20 mg by mouth every evening.      Toujeo SoloStar 300 UNIT/ML injection pen                Past History:   Medical: Pt has a past medical history of Diabetes mellitus, GERD (gastroesophageal reflux disease), Hyperlipidemia, Hypertension, Prostate cancer, and Type 2 diabetes mellitus, controlled.    Surgical: Pt  has a past surgical history that includes EGD (03/23/2013); LAPAROSCOPIC, CHOLECYSTECTOMY, CHOLANGIOGRAM (03/24/2013); APPENDECTOMY (OPEN); Cholecystectomy; Hernia repair; Transurethral resection of prostate (2012); and EGD, COLONOSCOPY (N/A, 12/20/2015).    Family: The family history is not on file.    Social: Pt reports that he has quit smoking. He has never used smokeless tobacco. He reports that he does not drink alcohol and does not use drugs.      Physical Exam:   Constitutional: Vital signs reviewed.  NAD  Head: Normocephalic, atraumatic  Eyes: Conjunctiva and sclera are normal.  No injection or discharge.  Ears, Nose, Throat:  Normal external examination of the nose and ears.    Neck: Supple. No JVD.  Respiratory/Chest: No respiratory distress. Breath sounds clear to auscultation  Cardiac: regular rate and rhythm  Abdomen:  soft and nontender  Back:  no CVAT  Extremities:  no edema, positive pulses with good capillary refill, good ROM  Neurological: No sensory or motor deficits. Speech normal.  Skin: Warm and dry. No rash.  Psychiatric: Alert and conversant.  Normal affect.          MDM:   The differential diagnosis includes, but is not limited to acute coronary syndrome, unstable angina, pericarditis, aortic dissection, pulmonary embolism, CHF, atrial fibrillation/ flutter, ventricular dysrhythmia, chest wall pain, costochondritis, myofascial strain, viral syndrome, pneumonia, bronchitis, pleurisy, biliary disorder, GERD, abnormal EKG        Course in ED:   Patient is given GI cocktail in the ED with minimal relief. EKG without acute changes. Troponin x 2 negative. Patient with elevated dimer, so CTA chest ordered. Patient admitted to observation unit.       Diagnostic Results:   The results of the diagnostic studies have been reviewed by myself:    Radiologic Studies  XR Chest 2 Views    Result Date: 02/09/2021  No acute findings. ReadingStation:[facility]     Lab Studies  Labs Reviewed   COMPREHENSIVE METABOLIC PANEL - Abnormal; Notable for the following components:       Result Value    Glucose 225 (*)     Creatinine 1.47 (*)     EGFR 49 (*)     All other components within normal limits   D-DIMER, QUANTITATIVE - Abnormal; Notable for the following components:    D-Dimer 0.87 (*)     All other components within normal limits   VH EXTRA SPECIMEN LABEL   CBC AND DIFFERENTIAL   TROPONIN I   B-TYPE NATRIURETIC PEPTIDE   PT/INR   TROPONIN I   TROPONIN I   LIPID PANEL   HEMOGLOBIN A1C             EKG/ Procedure  / Critical Care time:   EKG: Sinus rhythm; negative for ischemic changes. HR 69.  Heart Score Calculator    0 points 1 point 2 points    History Slightly suspicious Moderately suspicious Highly suspicious +1   EKG Normal Non-specific repolarization disturbance Significant ST depression 0   Age (years) <45 45-64 ?74 +2   Risk factors* No known risk factors 1-2 risk factors ?3 risk factors or history of atherosclerotic disease +2   Initial troponin ?normal limit 1-3 normal limit >3 normal limit 0      Total Score   +5     *Risk factors: HTN, hypercholesterolemia, DM, obesity (BMI >30 kg/m), smoking (current, or smoking cessation ?3 month), positive family history (parent or sibling with CVD before age 23); atherosclerotic disease: prior MI, PCI/CABG, CVA/TIA, or peripheral arterial disease.  General Recommendations   (Some patients may require a more conservative plan of care)  Score 0 - 3:  Discharge and follow up with PCP. (Major Adverse Cardiac Event risk 2.5% over next six weeks)  Score 4 - 6: Observe for 6 hours and consider inpatient or early outpatient stress testing in the [facility] Pain [facility]. (Major Adverse Cardiac Event risk 20.3% over next six weeks)  Score 7 - 10: Observe and consider in-hospital stress testing or Cardiology consultation. (Major Adverse Cardiac Event risk 72.7% over next six weeks)  A Major Adverse Cardiac Event was defined as all-cause mortality, myocardial infarction, or coronary revascularization.      Total time providing critical care: none      ATTESTATIONS   This chart was generated by an EMR and may contain errors or additions/omissions not intended by the user.       Dierdre Searles, D.O.     Dierdre Searles, DO  02/10/21 [phone]

## 2021-02-10 NOTE — H&P (Signed)
[facility]  OBSERVATION UNIT  HISTORY AND PHYSICAL       Patient: Henry Hines  Admission Date: 02/09/2021    DOB: [birth year]  Age: 75 y.o.    MRN: [id number]  Sex: male    PCP: Anise Salvo, MD  Attending: Dierdre Searles, DO         HISTORY OF PRESENT ILLNESS     Henry Hines is a 75 y.o. male who presented with chest pain.  Patient has a chest pain since yesterday evening.  The pain has been relatively constant since it started.  The discomfort radiates across his chest.  Pain is worse with inspiration and worse with food intake.  He denies associated shortness of breath dizziness lightheadedness nausea or diaphoresis.  No recent fever chills cough sore throat abdominal pain vomiting or diarrhea.  No leg pain or swelling.    PAST MEDICAL HISTORY     Code Status: Prior    Past Medical History:   Diagnosis Date    Diabetes mellitus     GERD (gastroesophageal reflux disease)     Hyperlipidemia     Hypertension     Prostate cancer     Type 2 diabetes mellitus, controlled        Past Surgical History:   Procedure Laterality Date    APPENDECTOMY (OPEN)      CHOLECYSTECTOMY      EGD  03/23/2013    Procedure: EGD;  Surgeon: Jayme Cloud, MD;  Location: [facility];  Service: Gastroenterology;  Laterality: N/A;    EGD, COLONOSCOPY N/A 12/20/2015    Procedure: EGD, COLONOSCOPY;  Surgeon: Jayme Cloud, MD;  Location: [facility];  Service: Gastroenterology;  Laterality: N/A;    HERNIA REPAIR      LAPAROSCOPIC, CHOLECYSTECTOMY, CHOLANGIOGRAM  03/24/2013    Procedure: LAPAROSCOPIC, CHOLECYSTECTOMY, CHOLANGIOGRAM;  Surgeon: Leory Plowman, MD;  Location: [facility];  Service: General;  Laterality: N/A;  LAP CHOLE    ROOM 516      TRANSURETHRAL RESECTION OF PROSTATE  [address] [place], [place], [place]       Current/Home Medications    ASPIRIN EC 81 MG EC TABLET    Take 81 mg by mouth every evening.        ATORVASTATIN (LIPITOR) 10 MG TABLET    Take 10 mg by mouth daily     FERROUS FUMARATE 324 (106 FE) MG TAB    1 tablet daily    HYDROCODONE-ACETAMINOPHEN (NORCO) 5-325 MG PER TABLET    Take 1-2 tablets by mouth every 6 (six) hours as needed for Pain    INSULIN LISPRO (HUMALOG) 100 UNIT/ML INJECTION    Inject 10 Units into the skin 2 (two) times daily before meals.5 units at lunch and dinner         LISINOPRIL (PRINIVIL,ZESTRIL) 2.5 MG TABLET    Take 2.5 mg by mouth every evening.    METFORMIN (GLUCOPHAGE) 500 MG TABLET    Take 1,000 mg by mouth 2 (two) times daily with meals.Take two tablets (1,000 mg total) twice a day        OMEPRAZOLE (PRILOSEC) 20 MG CAPSULE    Take 20 mg by mouth every evening.    ONDANSETRON (ZOFRAN ODT) 8 MG DISINTEGRATING TABLET    Take 1 tablet (8 mg total) by mouth every 8 (eight) hours as needed for Nausea    TOUJEO SOLOSTAR 300 UNIT/ML INJECTION PEN  Allergies:   Allergies   Allergen Reactions    Oxycodone Nausea And Vomiting       History reviewed. No pertinent family history.    SHx:  reports that he has quit smoking. He has never used smokeless tobacco. He reports that he does not drink alcohol and does not use drugs.    REVIEW OF SYSTEMS     Review of Systems   Constitutional:  Negative for chills, diaphoresis, fever and malaise/fatigue.   HENT:  Negative for congestion, sinus pain and sore throat.    Eyes:  Negative for blurred vision and double vision.   Respiratory:  Negative for cough, sputum production, shortness of breath, wheezing and stridor.    Cardiovascular:  Positive for chest pain. Negative for palpitations, orthopnea, leg swelling and PND.   Gastrointestinal:  Negative for abdominal pain, blood in stool, diarrhea, melena, nausea and vomiting.   Genitourinary:  Negative for dysuria, flank pain, frequency and urgency.   Musculoskeletal:  Negative for joint pain and myalgias.   Skin: Negative.    Neurological:  Negative for dizziness, tingling, focal weakness, loss of consciousness, weakness and headaches.   Endo/Heme/Allergies:  Negative.    Psychiatric/Behavioral: Negative.       PHYSICAL EXAM     Vital Signs: Blood pressure 139/54, pulse 65, temperature 97.8 F (36.6 C), temperature source Oral, resp. rate 13, height 1.803 m (5\' 11" ), weight 91.6 kg (201 lb 14.4 oz), SpO2 97 %.    Physical Exam  Vitals reviewed.   Constitutional:       Appearance: He is well-developed.   HENT:      Head: Normocephalic and atraumatic.   Eyes:      Pupils: Pupils are equal, round, and reactive to light.   Neck:      Vascular: No JVD.   Cardiovascular:      Rate and Rhythm: Normal rate and regular rhythm.      Heart sounds: Normal heart sounds. No murmur heard.    No friction rub. No gallop.   Pulmonary:      Effort: Pulmonary effort is normal. No respiratory distress.      Breath sounds: Normal breath sounds. No wheezing or rales.   Chest:      Chest wall: No tenderness.   Abdominal:      General: Bowel sounds are normal. There is no distension.      Palpations: Abdomen is soft.      Tenderness: There is no abdominal tenderness.   Musculoskeletal:         General: No tenderness. Normal range of motion.      Cervical back: Normal range of motion and neck supple.   Skin:     General: Skin is warm and dry.      Coloration: Skin is not pale.      Findings: No erythema or rash.   Neurological:      Mental Status: He is alert and oriented to person, place, and time.   Psychiatric:         Behavior: Behavior normal.       LABS & IMAGING     Labs:  Results for orders placed or performed during the hospital encounter of 02/09/21   CBC and differential   Result Value Ref Range    WBC 9.3 4.0 - 11.0 K/cmm    RBC 4.97 4.00 - 5.70 M/cmm    Hemoglobin 14.9 13.0 - 17.5 gm/dL    Hematocrit 16.1 09.6 - 52.5 %  MCV 93 80 - 100 fL    MCH 30 28 - 35 pg    MCHC 32 32 - 36 gm/dL    RDW 16.1 09.6 - 04.5 %    PLT CT 287 130 - 440 K/cmm    MPV 6.6 6.0 - 10.0 fL    Neutrophils % 64.6 42.0 - 78.0 %    Lymphocytes 21.8 15.0 - 46.0 %    Monocytes 10.6 3.0 - 15.0 %    Eosinophils %  2.6 0.0 - 7.0 %    Basophils % 0.4 0.0 - 3.0 %    Neutrophils Absolute 6.0 1.7 - 8.6 K/cmm    Lymphocytes Absolute 2.0 0.6 - 5.1 K/cmm    Monocytes Absolute 1.0 0.1 - 1.7 K/cmm    Eosinophils Absolute 0.2 0.0 - 0.8 K/cmm    Basophils Absolute 0.0 0.0 - 0.3 K/cmm   Troponin I   Result Value Ref Range    Troponin I <0.01 0.00 - 0.02 ng/mL   Comprehensive metabolic panel   Result Value Ref Range    Sodium 136 136 - 147 mMol/L    Potassium 4.3 3.5 - 5.3 mMol/L    Chloride 102 98 - 110 mMol/L    CO2 24 20 - 30 mMol/L    Calcium 9.7 8.5 - 10.5 mg/dL    Glucose 409 (H) 71 - 99 mg/dL    Creatinine 8.11 (H) 0.80 - 1.30 mg/dL    BUN 17 7 - 22 mg/dL    Protein, Total 7.4 6.0 - 8.3 gm/dL    Albumin 3.8 3.5 - 5.0 gm/dL    Alkaline Phosphatase 114 40 - 145 U/L    ALT 21 0 - 55 U/L    AST (SGOT) 20 10 - 42 U/L    Bilirubin, Total 0.8 0.1 - 1.2 mg/dL    Albumin/Globulin Ratio 1.06 0.80 - 2.00 Ratio    Anion Gap 14.3 7.0 - 18.0 mMol/L    BUN / Creatinine Ratio 11.6 10.0 - 30.0 Ratio    EGFR 49 (L) 60 - 150 mL/min/1.43m2    Osmolality Calculated 281 275 - 300 mOsm/kg    Globulin 3.6 2.0 - 4.0 gm/dL   B-type Natriuretic Peptide   Result Value Ref Range    B-Natriuretic Peptide 33.6 0.0 - 100.0 pg/mL   Prothrombin time/INR   Result Value Ref Range    PT 10.1 9.4 - 11.5 sec    PT INR 1.0 0.9 - 1.1   Troponin I   Result Value Ref Range    Troponin I 0.01 0.00 - 0.02 ng/mL   D-dimer, quantitative   Result Value Ref Range    D-Dimer 0.87 (H) 0.19 - 0.52 mg/L FEU       Imaging:  XR Chest 2 Views   Final Result   No acute findings.      ReadingStation:[facility]          EKG: none Sinus rhythm; negative for ischemic changes.    EMERGENCY DEPARTMENT COURSE     ED Medication Orders (From admission, onward)      Start Ordered     Status Ordering Provider    02/10/21 0124 02/10/21 0123  amoxicillin (AMOXIL) tablet 500 mg  Once in ED        Route: Oral  Ordered Dose: 500 mg     Last MAR action: Given Ruffin Pyo    02/10/21 0046 02/10/21  0046  GI cocktail (dicyclomine/viscous lidocaine/Maalox) suspension 45 mL  Once in  ED        Route: Oral     Last MAR action: Given AHMAD, AISHA            ED documentation including nurses notes were reviewed by myself.  The case was discussed with the ED Attending.    ASSESSMENT & PLAN     Gen Clagg is a 75 y.o. male admitted under OBSERVATION with   Chest pain  Hypertension  Hyperlipidemia  Diabetes  Family history CAD  GERD    Assessment & Plan:  Transfer to observation unit  Serial cardiac enzymes  Telemetry monitoring  Nitrates PRN  Analgesics/Antiemetics PRN  Aspirin daily  Fasting lipids   Repeat ECG PRN  Nuclear stress test in AM  Cardiology consultation PRN     I certify the need for admission to the Observation Unit based on the patient's history and the information above.    Rolin Barry, NP   02/10/2021 2:42 AM

## 2021-02-10 NOTE — Plan of Care (Signed)
Problem: Chest Pain  Goal: Vital signs and cardiac rhythm stable  Outcome: Progressing  Flowsheets (Taken 02/10/2021 0803)  Vital signs and cardiac rhythm stable:   Monitor /assess vital signs/cardiac rhythms   Monitor labs   Assess the need for oxygen therapy and administer as ordered  Goal: Cardiac pain management  Outcome: Progressing  Flowsheets (Taken 02/10/2021 0803)  Cardiac pain management:   Assess/report chest pain/or related discomfort to LIP immediately   Instruct patient to report any change in pain status   Assess pain/or related discomfort on admission, during daily assessment, before and after any intervention   Include patient and patient care companion in decisions related to pain management  Goal: Anxiety management/effective coping  Outcome: Progressing  Flowsheets (Taken 02/10/2021 0803)  Anxiety management/effective coping:   Assess/report uncontrolled anxiety, depression or ineffective coping to LIP   Offer reassurance to decrease anxiety   Encourage patient to immediately report any increase in anxiety and/or depression   Include patient in decision making of their care and give updates on their health status

## 2021-02-10 NOTE — Progress Notes (Addendum)
NURSE NOTE SUMMARY  [facility] - CLINICAL OBSERVATION UNIT   Patient Name: Henry Hines   Attending Physician: Donalynn Furlong, MD   Today's date:   02/10/2021 LOS: 0 days   Shift Summary:                                                              1610: Assumed care of pt, assessment completed. See flowsheet. Pt Aox4. No needs expressed at this time. Pt able to ambulate to bathroom without assistance. Call bell within reach. Will continue to monitor.  0720: Pt off unit for stress test.  1100: Pt returned to unit. V/S obtained. BS checked. Some medications that were due per [facility] this morning, pt states takes at night. MAR updated. Will continue to monitor.  1310: Discharge instructions reviewed. Pt verbalized understanding. IV removed. Pt finishing up lunch at this time and then will place transport.  1350: Pt transported to vehicle.   Provider Notifications:        Rapid Response Notifications:  Mobility:      PMP Activity: Step 7 - Walks out of Room (02/10/2021  7:15 AM)     Weight tracking:  Family Dynamic:   Last 3 Weights for the past 72 hrs (Last 3 readings):   Weight   02/09/21 1914 91.6 kg (201 lb 14.4 oz)             Last Bowel Movement   Last BM Date: 02/09/21

## 2021-02-10 NOTE — Progress Notes (Signed)
Stress images pending. No chest pain with treadmill portion, but pain with swallowing liquids. GERD should be included in differential diagnoses.   Rosalita Levan, FNP

## 2021-02-11 LAB — ECG 12-LEAD
P Wave Axis: -15 deg
P-R Interval: 165 ms
Patient Age: 75 years
Q-T Interval(Corrected): 418 ms
Q-T Interval: 390 ms
QRS Axis: -35 deg
QRS Duration: 92 ms
T Axis: 44 years
Ventricular Rate: 69 //min

## 2021-02-12 LAB — VH EXTRA SPECIMEN LABEL

## 2021-03-30 ENCOUNTER — Encounter (HOSPITAL_BASED_OUTPATIENT_CLINIC_OR_DEPARTMENT_OTHER)
Admission: RE | Disposition: A | Discharge status: Home / Self Care | Payer: Self-pay | Source: Ambulatory Visit | Attending: Gastroenterology

## 2021-03-30 ENCOUNTER — Ambulatory Visit (HOSPITAL_BASED_OUTPATIENT_CLINIC_OR_DEPARTMENT_OTHER): Payer: Medicare Other | Admitting: Anesthesiology

## 2021-03-30 ENCOUNTER — Encounter (HOSPITAL_BASED_OUTPATIENT_CLINIC_OR_DEPARTMENT_OTHER): Payer: Self-pay | Admitting: Gastroenterology

## 2021-03-30 ENCOUNTER — Ambulatory Visit
Admission: RE | Admit: 2021-03-30 | Discharge: 2021-03-30 | Disposition: A | Discharge status: Home / Self Care | Payer: Medicare Other | Source: Ambulatory Visit | Attending: Gastroenterology | Admitting: Gastroenterology

## 2021-03-30 DIAGNOSIS — K5730 Diverticulosis of large intestine without perforation or abscess without bleeding: Secondary | ICD-10-CM | POA: Insufficient documentation

## 2021-03-30 DIAGNOSIS — E119 Type 2 diabetes mellitus without complications: Secondary | ICD-10-CM | POA: Insufficient documentation

## 2021-03-30 DIAGNOSIS — Z7982 Long term (current) use of aspirin: Secondary | ICD-10-CM | POA: Insufficient documentation

## 2021-03-30 DIAGNOSIS — Z86010 Personal history of colonic polyps: Secondary | ICD-10-CM | POA: Insufficient documentation

## 2021-03-30 DIAGNOSIS — R0789 Other chest pain: Secondary | ICD-10-CM | POA: Insufficient documentation

## 2021-03-30 DIAGNOSIS — R1310 Dysphagia, unspecified: Secondary | ICD-10-CM | POA: Insufficient documentation

## 2021-03-30 DIAGNOSIS — D124 Benign neoplasm of descending colon: Secondary | ICD-10-CM | POA: Insufficient documentation

## 2021-03-30 DIAGNOSIS — D125 Benign neoplasm of sigmoid colon: Secondary | ICD-10-CM | POA: Insufficient documentation

## 2021-03-30 DIAGNOSIS — Z1211 Encounter for screening for malignant neoplasm of colon: Secondary | ICD-10-CM | POA: Insufficient documentation

## 2021-03-30 DIAGNOSIS — D123 Benign neoplasm of transverse colon: Secondary | ICD-10-CM | POA: Insufficient documentation

## 2021-03-30 DIAGNOSIS — K648 Other hemorrhoids: Secondary | ICD-10-CM | POA: Insufficient documentation

## 2021-03-30 DIAGNOSIS — D126 Benign neoplasm of colon, unspecified: Secondary | ICD-10-CM | POA: Insufficient documentation

## 2021-03-30 DIAGNOSIS — K317 Polyp of stomach and duodenum: Secondary | ICD-10-CM | POA: Insufficient documentation

## 2021-03-30 HISTORY — PX: EGD, COLONOSCOPY: SHX3799

## 2021-03-30 SURGERY — EGD, COLONOSCOPY
Anesthesia: Anesthesia MAC / Sedation | Site: Anus | Wound class: Clean Contaminated

## 2021-03-30 MED ORDER — PROPOFOL 200 MG/20ML IV EMUL
INTRAVENOUS | Status: AC
Start: 2021-03-30 — End: ?
  Filled 2021-03-30: qty 40

## 2021-03-30 MED ORDER — PROPOFOL 200 MG/20ML IV EMUL
INTRAVENOUS | Status: DC | PRN
Start: 2021-03-30 — End: 2021-03-30
  Administered 2021-03-30 (×9): 20 mg via INTRAVENOUS
  Administered 2021-03-30: 80 mg via INTRAVENOUS
  Administered 2021-03-30 (×6): 20 mg via INTRAVENOUS

## 2021-03-30 MED ORDER — LIDOCAINE HCL (PF) 2 % IJ SOLN
INTRAMUSCULAR | Status: DC | PRN
Start: 2021-03-30 — End: 2021-03-30
  Administered 2021-03-30: 60 mg via INTRADERMAL

## 2021-03-30 MED ORDER — ALBUTEROL SULFATE (2.5 MG/3ML) 0.083% IN NEBU
2.5000 mg | INHALATION_SOLUTION | RESPIRATORY_TRACT | Status: DC | PRN
Start: 2021-03-30 — End: 2021-03-30

## 2021-03-30 MED ORDER — SODIUM CHLORIDE (PF) 0.9 % IJ SOLN
3.0000 mL | Freq: Two times a day (BID) | INTRAMUSCULAR | Status: DC
Start: 2021-03-30 — End: 2021-03-30

## 2021-03-30 MED ORDER — ONDANSETRON 4 MG PO TBDP
4.0000 mg | ORAL_TABLET | Freq: Once | ORAL | Status: DC | PRN
Start: 2021-03-30 — End: 2021-03-30

## 2021-03-30 MED ORDER — SODIUM CHLORIDE 0.9 % IV SOLN
INTRAVENOUS | Status: DC
Start: 2021-03-30 — End: 2021-03-30

## 2021-03-30 MED ORDER — ONDANSETRON HCL 4 MG/2ML IJ SOLN
4.0000 mg | Freq: Once | INTRAMUSCULAR | Status: DC | PRN
Start: 2021-03-30 — End: 2021-03-30

## 2021-03-30 SURGICAL SUPPLY — 36 items
AGENT SUBMUCOSAL INJECTION (Supply) IMPLANT
BRUSH HEDGEHOG DUAL END (Supply) ×3 IMPLANT
CATH GLD PROBE BICAP 7FX210CM (Supply) IMPLANT
CATH GLD PROBE BICAP 7FX300CM (Supply) IMPLANT
CATH GOLD PROBE 10FR (Supply) IMPLANT
CATH GOLD PROBE INJECTION 10F (Supply) IMPLANT
CLEANER ENZYMATIC BEDSIDE (Supply) ×3 IMPLANT
CLIP LIGATING RES 360 ULT 17MM (Supply) IMPLANT
CLIP RESOLUTION 360 (Supply) IMPLANT
CONNECTOR QUICK PORT (Supply) ×3 IMPLANT
CRE PYLORIC COL 10-12 DIL 5847 (Supply) IMPLANT
CRE PYLORIC COL 12-15 DIL 5848 (Supply) IMPLANT
CRE PYLORIC COL 15-18 DIL 5849 (Supply) IMPLANT
CRE PYLORIC COL 8-10 DIL 5846 (Supply) IMPLANT
DEVICE STERIFLATE INFLATION (Supply) IMPLANT
DIL CRE 18-20 PYL CLN 5850 (Supply) IMPLANT
ENDOCUFF VISION LG GREEN 11.2 (Supply) IMPLANT
FORCEP BIOPSY HOT RAD JAW 4 (Supply) IMPLANT
FORCEP BIOPSY RAD JAW 1333-40 (Supply) ×3 IMPLANT
FORCEP RADIAL JAW JUMBO 240CM (Supply) IMPLANT
HEMOSTAT ENDO HEMOSPRAY 5G (Supply) IMPLANT
HEMOSTAT ENDO HEMOSPRAY 7FR (Supply)
KIT SYR ORISE 10ML (Supply)
MARKER ENDOSCOPIC SPOT (Supply) IMPLANT
NDL INTERJECT SCLERO 25G (Supply) IMPLANT
PAD CLINCH ENDO TRASPORT (Supply) ×3 IMPLANT
PROBE CIRCUMFRENTIAL (Supply) IMPLANT
PROBE SIDE FIRE (Supply) IMPLANT
PROBE STRAIGHT FIRE APC (Supply) IMPLANT
SNARE ENDO CAP RND 10MM COLD (Supply) IMPLANT
SNARE ENDO CAP2 RND 10MM LOOP (Supply) ×3 IMPLANT
SNARE LARGE CAPTIV OVAL 6131 (Supply) IMPLANT
SNARE ROTATE SM OVAL MED STFF (Supply) IMPLANT
SNARE SMALL CAPTIV 6230 (Supply) IMPLANT
VALVE DISP CLEANING BIOGUARD (Supply) ×3 IMPLANT
VALVE OLYMPUS DISP A/W/S/ BIO (Supply) ×3 IMPLANT

## 2021-03-30 NOTE — Anesthesia Preprocedure Evaluation (Signed)
Anesthesia Evaluation    AIRWAY    Mallampati: I    TM distance: >3 FB  Neck ROM: full  Mouth Opening:full   CARDIOVASCULAR    cardiovascular exam normal       DENTAL    no notable dental hx    (+) lower dentures and upper dentures             PULMONARY    pulmonary exam normal     OTHER FINDINGS                                    Relevant Problems   GU/RENAL   (+) AKI (acute kidney injury)      ENDO   (+) Diabetes mellitus type 2 in nonobese               Anesthesia Plan    ASA 2     MAC               (Pt seen and examined in preop holding. Pt describes activity consistent with >4METS. No active chest pain/dyspnea. No recent respiratory infections. Medical and surgical history as documented. NPO status confirmed.    Anesthetic plan discussed with patient, risks/benefits/alternatives to plan discussed. Risks discussed included potential for damage to teeth, lips, gums, vocal cords, or other airway structures, nausea, corneal abraision, and other injuries up to an including stroke, heart attack, and death. Pt agreed to anesthetic plan, and signed consent.    Due to the nature of electronic medical records, not all data can be expected to be entirely accurate, due to data artifacts (surgeon leaning on cuff, pressure transducer height changed, bovie ECG interference, etc) and data entry from other staff. All reasonable efforts to ensure accuracy of data are taken.  )      intravenous induction   Detailed anesthesia plan: MAC        Post op pain management: per surgeon    informed consent obtained                   Signed by: Caesar Chestnut, DO 03/30/21 9:13 AM

## 2021-03-30 NOTE — Transfer of Care (Signed)
Anesthesia Transfer of Care Note    Patient: Henry Hines    Last vitals:   Vitals:    03/30/21 1031   BP: 98/58   Pulse: 64   Resp: 14   Temp: 36.6 C (97.9 F)   SpO2: 98%       Oxygen: Room Air     Mental Status:awake and alert     Airway: Natural    Cardiovascular Status:  stable

## 2021-03-30 NOTE — Discharge Instr - AVS First Page (Signed)
Endoscopy Discharge Instructions    After you leave the hospital:  Due to the effects of the sedatives, you may feel tired for the remainder of today.   We recommend you go home after discharge  It is advisable to have supervision or access to seek help for a few hours after discharge  Do not drive, operate machinery, or sign important documents until tomorrow.  Resume your normal activities / work tomorrow  Please rest and drink extra fluids today.    Avoid alcohol today.  Unless otherwise guided under the recommendation section of your procedure report, start with light foods and advance to your regular diet as tolerated.  Refer to the recommendation section of the procedure report for medication information.  IV Site  Initial IV site bruising and tenderness is expected and should ease over time  Warm compress may help.   Avoid watches / bracelets to area   If site bleeds after removal apply consistent pressure for several minutes and replace dressing  Notify provider if:   Insertion site is hot to the touch or red streaks in your skin near the IV site  Drainage from the IV site   Swelling, pain, or redness at the IV site that worsens over time.   Fever of 100.4F (38C) or higher    Obstructive sleep apnea -Medicines you were given may cause you to have more apnea spells that may last longer than normal.    Unless otherwise instructed, keep using the continuous positive airway pressure (CPAP) device when you sleep day or night.   Talk with your provider before taking any pain medicine, muscle relaxants, or sedatives.     Seeking Medical Attention: -   you should be back to your usual activity within 24 hours.     Nausea / Vomiting: - try small amounts of bland foods like crackers or toast, & liquids such as soda, electrolyte replacement drinks or ice chips. Call for:  New onset or ongoing nausea and vomiting   The symptoms worsen - cold skin, confusion, blood or unexplainable black appearing  vomit  Bleeding:  Passing of blood or clots  or a change in stool consistency and or black in color  Vomiting or spitting up blood  Infection:  Fever- temperature of 100.4 or greater  Pain / other:  New onset of pain that does not subside over time or prevents you from normal activity or eating  Shortness of breath or breathing trouble  Weakness, feeling faint, passing out  Swollen or distended abdomen    For assistance with concerns:  During business hours, call your physician's office.   After-hours call [facility] [phone] and a representative will be contacted  For immediate or urgent care call 911

## 2021-03-30 NOTE — Anesthesia Postprocedure Evaluation (Signed)
Anesthesia Post Evaluation    Patient: Henry Hines    Procedure(s):  EGD, COLONOSCOPY    Anesthesia type: MAC    Last Vitals:   Vitals Value Taken Time   BP 134/93 03/30/21 1049   Temp 36.6 03/30/21 1058   Pulse 75 03/30/21 1058   Resp 16 03/30/21 1049   SpO2 100 % 03/30/21 1049                 Anesthesia Post Evaluation:     Patient Evaluated: PACU  Patient Participation: complete - patient participated  Level of Consciousness: awake and alert    Pain Management: adequate    Airway Patency: patent    Anesthetic complications: No      PONV Status: none    Cardiovascular status: hemodynamically stable and acceptable  Respiratory status: room air, nonlabored ventilation and acceptable  Hydration status: acceptable        Signed by: Caesar Chestnut, DO, 03/30/2021 10:58 AM

## 2021-03-31 ENCOUNTER — Encounter (HOSPITAL_BASED_OUTPATIENT_CLINIC_OR_DEPARTMENT_OTHER): Payer: Self-pay | Admitting: Gastroenterology

## 2021-04-03 LAB — VH SURGICAL PATHOLOGY

## 2021-08-15 ENCOUNTER — Encounter (INDEPENDENT_AMBULATORY_CARE_PROVIDER_SITE_OTHER): Payer: Self-pay

## 2021-08-15 ENCOUNTER — Ambulatory Visit (INDEPENDENT_AMBULATORY_CARE_PROVIDER_SITE_OTHER): Payer: Medicare Other

## 2021-08-15 VITALS — BP 124/70 | HR 64 | Temp 97.6°F | Resp 17 | Ht 71.0 in | Wt 196.0 lb

## 2021-08-15 DIAGNOSIS — L03115 Cellulitis of right lower limb: Secondary | ICD-10-CM

## 2021-08-15 MED ORDER — DOXYCYCLINE HYCLATE 100 MG PO CAPS
100.0000 mg | ORAL_CAPSULE | Freq: Two times a day (BID) | ORAL | 0 refills | Status: AC
Start: 2021-08-15 — End: 2021-08-25

## 2021-08-15 NOTE — Progress Notes (Signed)
Subjective:    Patient ID: Henry Hines is a 76 y.o. male who presents today for evaluation of redness at site of tick bite.  Reports pulling tick off his right lateral calf 1 week ago.  He noticed erythema 2 to 3 days ago which has become larger in size.  He denies fever, chills, arthralgias, headache, nausea, or vomiting.  He has been cleaning area and applying triple antibiotic.    HPI    The following portions of the patient's history were reviewed and updated as appropriate: allergies, current medications, past family history, past medical history, past social history, past surgical history, and problem list.    Review of Systems   Constitutional:  Negative for activity change, fatigue and fever.   HENT: Negative.  Negative for congestion, ear pain and sore throat.    Respiratory:  Negative for cough, shortness of breath and wheezing.    Cardiovascular:  Negative for chest pain.   Gastrointestinal:  Negative for abdominal pain, nausea and vomiting.   Genitourinary:  Negative for dysuria.   Musculoskeletal:  Negative for arthralgias.   Skin:  Positive for color change (redness at tick bite Rt calf) and wound (tick bite right calf). Negative for rash.   Neurological:  Negative for dizziness and headaches.         Objective:    BP 124/70   Pulse 64   Temp 97.6 F (36.4 C)   Resp 17   Ht 1.803 m (5\' 11" )   Wt 88.9 kg (196 lb)   BMI 27.34 kg/m     Physical Exam  Constitutional:       General: He is not in acute distress.     Appearance: Normal appearance. He is not ill-appearing.   HENT:      Head: Normocephalic.   Cardiovascular:      Rate and Rhythm: Normal rate.      Pulses: Normal pulses.   Skin:     General: Skin is warm and dry.      Findings: Erythema and lesion present.      Comments: Erythema on lateral right calf size of a golf ball with center scab.  Area slightly warm to touch.       Neurological:      Mental Status: He is alert.         Assessment and Plan:       Henry Hines was seen today for tick  bite.    Diagnoses and all orders for this visit:    Cellulitis of right lower extremity      -Will treat with doxycycline as prescribed above as patient is not certain about the time frame of attachment of tick and per patient request as he is concerned about Lyme disease.  -Instructed to wash area and apply triple antibiotic daily.  -Discussed side effects and adverse reactions of doxycycline including photosensitivity.  -Weight prolonged exposure to sunlight and wear sunscreen, hat, thin longsleeve shirt when in direct sunlight.  Follow-up with PCP or this clinic if symptoms persist or worsen.        Discussed above plan of care with patient including medication use, side effects, and potential adverse reactions whom is agreeable and voices understanding.  All questions and concerns addressed.         Louie Boston, NP  [facility]  08/15/2021  9:23 AM
